# Patient Record
Sex: Male | Born: 1965 | Hispanic: No | Marital: Single | Smoking: Current some day smoker
Health system: Southern US, Community
[De-identification: ages and names within clinical notes are randomized; demographics above are authoritative.]

## PROBLEM LIST (undated history)

## (undated) DIAGNOSIS — R0602 Shortness of breath: Secondary | ICD-10-CM

## (undated) DIAGNOSIS — G4730 Sleep apnea, unspecified: Secondary | ICD-10-CM

## (undated) DIAGNOSIS — R51 Headache: Secondary | ICD-10-CM

## (undated) DIAGNOSIS — F419 Anxiety disorder, unspecified: Secondary | ICD-10-CM

## (undated) DIAGNOSIS — J189 Pneumonia, unspecified organism: Secondary | ICD-10-CM

## (undated) DIAGNOSIS — R079 Chest pain, unspecified: Secondary | ICD-10-CM

## (undated) DIAGNOSIS — Z720 Tobacco use: Secondary | ICD-10-CM

## (undated) DIAGNOSIS — I517 Cardiomegaly: Secondary | ICD-10-CM

## (undated) DIAGNOSIS — F1010 Alcohol abuse, uncomplicated: Secondary | ICD-10-CM

## (undated) DIAGNOSIS — K219 Gastro-esophageal reflux disease without esophagitis: Secondary | ICD-10-CM

---

## 1999-08-06 ENCOUNTER — Emergency Department (HOSPITAL_COMMUNITY): Admission: EM | Admit: 1999-08-06 | Discharge: 1999-08-06 | Payer: Self-pay | Admitting: Emergency Medicine

## 2005-02-04 HISTORY — PX: LAPAROSCOPIC CHOLECYSTECTOMY: SUR755

## 2007-02-15 ENCOUNTER — Emergency Department (HOSPITAL_COMMUNITY): Admission: EM | Admit: 2007-02-15 | Discharge: 2007-02-16 | Payer: Self-pay | Admitting: Emergency Medicine

## 2010-03-27 ENCOUNTER — Emergency Department (HOSPITAL_COMMUNITY)
Admission: EM | Admit: 2010-03-27 | Discharge: 2010-03-28 | Disposition: A | Discharge status: Home / Self Care | Payer: Self-pay | Attending: Emergency Medicine | Admitting: Emergency Medicine

## 2010-03-27 DIAGNOSIS — IMO0002 Reserved for concepts with insufficient information to code with codable children: Secondary | ICD-10-CM | POA: Insufficient documentation

## 2010-03-27 DIAGNOSIS — R0682 Tachypnea, not elsewhere classified: Secondary | ICD-10-CM | POA: Insufficient documentation

## 2010-03-27 DIAGNOSIS — S1093XA Contusion of unspecified part of neck, initial encounter: Secondary | ICD-10-CM | POA: Insufficient documentation

## 2010-03-27 DIAGNOSIS — S0003XA Contusion of scalp, initial encounter: Secondary | ICD-10-CM | POA: Insufficient documentation

## 2010-03-27 DIAGNOSIS — R51 Headache: Secondary | ICD-10-CM | POA: Insufficient documentation

## 2010-03-27 DIAGNOSIS — R000 Tachycardia, unspecified: Secondary | ICD-10-CM | POA: Insufficient documentation

## 2010-03-27 DIAGNOSIS — S0180XA Unspecified open wound of other part of head, initial encounter: Secondary | ICD-10-CM | POA: Insufficient documentation

## 2010-03-27 DIAGNOSIS — Y9289 Other specified places as the place of occurrence of the external cause: Secondary | ICD-10-CM | POA: Insufficient documentation

## 2010-03-27 LAB — DIFFERENTIAL
Basophils Relative: 0 % (ref 0–1)
Monocytes Absolute: 0.5 10*3/uL (ref 0.1–1.0)
Monocytes Relative: 6 % (ref 3–12)
Neutro Abs: 3.5 10*3/uL (ref 1.7–7.7)

## 2010-03-27 LAB — CBC
Hemoglobin: 16.5 g/dL (ref 13.0–17.0)
MCH: 31.4 pg (ref 26.0–34.0)
MCHC: 34.6 g/dL (ref 30.0–36.0)
MCV: 90.7 fL (ref 78.0–100.0)

## 2010-03-28 ENCOUNTER — Emergency Department (HOSPITAL_COMMUNITY): Payer: Self-pay

## 2010-03-28 ENCOUNTER — Encounter (HOSPITAL_COMMUNITY): Payer: Self-pay

## 2010-03-28 LAB — COMPREHENSIVE METABOLIC PANEL
BUN: 8 mg/dL (ref 6–23)
CO2: 22 mEq/L (ref 19–32)
Calcium: 8.6 mg/dL (ref 8.4–10.5)
Creatinine, Ser: 0.76 mg/dL (ref 0.4–1.5)
GFR calc Af Amer: >60 mL/min (ref >60)
GFR calc non Af Amer: >60 mL/min (ref >60)
Glucose, Bld: 211 mg/dL — ABNORMAL HIGH (ref 70–99)

## 2010-03-28 LAB — URINALYSIS, ROUTINE W REFLEX MICROSCOPIC
Protein, ur: NEGATIVE mg/dL
Urobilinogen, UA: 0.2 mg/dL (ref 0.0–1.0)

## 2010-03-28 LAB — LIPASE, BLOOD: Lipase: 31 U/L (ref 11–59)

## 2010-03-28 LAB — ETHANOL: Alcohol, Ethyl (B): 220 mg/dL — ABNORMAL HIGH (ref 0–10)

## 2010-08-20 ENCOUNTER — Emergency Department (HOSPITAL_COMMUNITY)
Admission: EM | Admit: 2010-08-20 | Discharge: 2010-08-20 | Discharge status: Against Medical Advice | Payer: Self-pay | Attending: Emergency Medicine | Admitting: Emergency Medicine

## 2010-08-20 DIAGNOSIS — W01119A Fall on same level from slipping, tripping and stumbling with subsequent striking against unspecified sharp object, initial encounter: Secondary | ICD-10-CM | POA: Insufficient documentation

## 2010-08-20 DIAGNOSIS — S61209A Unspecified open wound of unspecified finger without damage to nail, initial encounter: Secondary | ICD-10-CM | POA: Insufficient documentation

## 2010-08-20 DIAGNOSIS — W268XXA Contact with other sharp object(s), not elsewhere classified, initial encounter: Secondary | ICD-10-CM | POA: Insufficient documentation

## 2010-10-25 LAB — BASIC METABOLIC PANEL
BUN: 9
Calcium: 9
Chloride: 107
Creatinine, Ser: 0.88

## 2010-10-25 LAB — URINALYSIS, ROUTINE W REFLEX MICROSCOPIC
Ketones, ur: NEGATIVE
Nitrite: NEGATIVE
Protein, ur: NEGATIVE
Urobilinogen, UA: 0.2
pH: 5.5

## 2010-10-25 LAB — POCT CARDIAC MARKERS
CKMB, poc: 1.2
Myoglobin, poc: 63.9

## 2010-10-25 LAB — HEPATIC FUNCTION PANEL
ALT: 36
Bilirubin, Direct: 0.2
Indirect Bilirubin: 1 — ABNORMAL HIGH

## 2010-10-25 LAB — CBC
MCHC: 35.7
RBC: 5.13
RDW: 12.7

## 2010-10-25 LAB — DIFFERENTIAL
Basophils Absolute: 0
Basophils Relative: 0
Neutro Abs: 7.4
Neutrophils Relative %: 59

## 2010-12-05 ENCOUNTER — Emergency Department (HOSPITAL_COMMUNITY): Payer: Self-pay

## 2010-12-05 ENCOUNTER — Emergency Department (HOSPITAL_COMMUNITY)
Admission: EM | Admit: 2010-12-05 | Discharge: 2010-12-05 | Disposition: A | Discharge status: Home / Self Care | Payer: Self-pay | Attending: Emergency Medicine | Admitting: Emergency Medicine

## 2010-12-05 DIAGNOSIS — M79609 Pain in unspecified limb: Secondary | ICD-10-CM | POA: Insufficient documentation

## 2010-12-05 DIAGNOSIS — M25539 Pain in unspecified wrist: Secondary | ICD-10-CM | POA: Insufficient documentation

## 2010-12-05 DIAGNOSIS — M7989 Other specified soft tissue disorders: Secondary | ICD-10-CM | POA: Insufficient documentation

## 2010-12-05 DIAGNOSIS — M779 Enthesopathy, unspecified: Secondary | ICD-10-CM | POA: Insufficient documentation

## 2011-01-12 ENCOUNTER — Emergency Department (HOSPITAL_COMMUNITY): Payer: Self-pay

## 2011-01-12 ENCOUNTER — Emergency Department (HOSPITAL_COMMUNITY)
Admission: EM | Admit: 2011-01-12 | Discharge: 2011-01-13 | Disposition: A | Discharge status: Home / Self Care | Payer: Self-pay | Attending: Emergency Medicine | Admitting: Emergency Medicine

## 2011-01-12 ENCOUNTER — Encounter (HOSPITAL_COMMUNITY): Payer: Self-pay | Admitting: Emergency Medicine

## 2011-01-12 DIAGNOSIS — F10929 Alcohol use, unspecified with intoxication, unspecified: Secondary | ICD-10-CM

## 2011-01-12 DIAGNOSIS — M545 Low back pain, unspecified: Secondary | ICD-10-CM | POA: Insufficient documentation

## 2011-01-12 DIAGNOSIS — F17200 Nicotine dependence, unspecified, uncomplicated: Secondary | ICD-10-CM | POA: Insufficient documentation

## 2011-01-12 DIAGNOSIS — M549 Dorsalgia, unspecified: Secondary | ICD-10-CM | POA: Insufficient documentation

## 2011-01-12 DIAGNOSIS — F1010 Alcohol abuse, uncomplicated: Secondary | ICD-10-CM | POA: Insufficient documentation

## 2011-01-12 DIAGNOSIS — R51 Headache: Secondary | ICD-10-CM | POA: Insufficient documentation

## 2011-01-12 DIAGNOSIS — R079 Chest pain, unspecified: Secondary | ICD-10-CM | POA: Insufficient documentation

## 2011-01-12 DIAGNOSIS — R220 Localized swelling, mass and lump, head: Secondary | ICD-10-CM | POA: Insufficient documentation

## 2011-01-12 HISTORY — DX: Alcohol abuse, uncomplicated: F10.10
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
## 2011-01-12 NOTE — ED Notes (Signed)
More awake and speech clear now---Soft drink, sandwich and chips given to pt.
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
## 2011-01-12 NOTE — ED Notes (Signed)
Pt recovering from ETOH intoxication, pt resting.  Plan of care voiced to pt.

## 2011-01-12 NOTE — ED Notes (Signed)
Resting on carrier--awakens with loud verbal stimuli----Pupils equal and reactive--speech is still somewhat slurred.

## 2011-01-12 NOTE — ED Notes (Signed)
Pt. Ate 100% of sandwich and chips---gait is steady, speech is clear and concise--ambulated to restroom

## 2011-01-12 NOTE — ED Notes (Signed)
Back to trt room---gait steady, speech clear.

## 2011-01-12 NOTE — ED Notes (Addendum)
Pt phoning his ride home, Dr. Clarene Duke made aware pt has ride home.  Gave pt sandwich and water.

## 2011-01-13 MED ORDER — ACETAMINOPHEN 325 MG PO TABS
650.0000 mg | ORAL_TABLET | Freq: Once | ORAL | Status: AC
  Administered 2011-01-13: 650 mg via ORAL
  Filled 2011-01-13: qty 2

## 2011-01-13 NOTE — ED Notes (Signed)
Patient is resting comfortably. 

## 2011-01-13 NOTE — ED Notes (Signed)
Patient denies pain and is resting comfortably.  

## 2011-02-15 ENCOUNTER — Emergency Department (HOSPITAL_COMMUNITY)
Admission: EM | Admit: 2011-02-15 | Discharge: 2011-02-16 | Disposition: A | Discharge status: Home / Self Care | Payer: Self-pay | Attending: Emergency Medicine | Admitting: Emergency Medicine

## 2011-02-15 ENCOUNTER — Encounter (HOSPITAL_COMMUNITY): Payer: Self-pay | Admitting: Emergency Medicine

## 2011-02-15 DIAGNOSIS — F17200 Nicotine dependence, unspecified, uncomplicated: Secondary | ICD-10-CM | POA: Insufficient documentation

## 2011-02-15 DIAGNOSIS — J329 Chronic sinusitis, unspecified: Secondary | ICD-10-CM | POA: Insufficient documentation

## 2011-02-15 DIAGNOSIS — R51 Headache: Secondary | ICD-10-CM | POA: Insufficient documentation

## 2011-02-15 DIAGNOSIS — F1010 Alcohol abuse, uncomplicated: Secondary | ICD-10-CM | POA: Insufficient documentation
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
## 2011-02-16 ENCOUNTER — Emergency Department (HOSPITAL_COMMUNITY): Payer: Self-pay

## 2011-02-16 MED ORDER — IBUPROFEN 200 MG PO TABS
600.0000 mg | ORAL_TABLET | Freq: Once | ORAL | Status: AC
  Administered 2011-02-16: 600 mg via ORAL
  Filled 2011-02-16: qty 3

## 2011-02-16 MED ORDER — AMOXICILLIN-POT CLAVULANATE 875-125 MG PO TABS: 1.0000 | ORAL_TABLET | Freq: Two times a day (BID) | ORAL | Status: DC
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
## 2011-02-22 ENCOUNTER — Emergency Department (HOSPITAL_COMMUNITY)
Admission: EM | Admit: 2011-02-22 | Discharge: 2011-02-23 | Disposition: A | Discharge status: Home / Self Care | Payer: Self-pay | Attending: Emergency Medicine | Admitting: Emergency Medicine

## 2011-02-22 ENCOUNTER — Encounter (HOSPITAL_COMMUNITY): Payer: Self-pay | Admitting: *Deleted

## 2011-02-22 ENCOUNTER — Emergency Department (HOSPITAL_COMMUNITY): Payer: Self-pay

## 2011-02-22 ENCOUNTER — Other Ambulatory Visit: Payer: Self-pay

## 2011-02-22 DIAGNOSIS — R0682 Tachypnea, not elsewhere classified: Secondary | ICD-10-CM | POA: Insufficient documentation

## 2011-02-22 DIAGNOSIS — F10121 Alcohol abuse with intoxication delirium: Secondary | ICD-10-CM

## 2011-02-22 DIAGNOSIS — R51 Headache: Secondary | ICD-10-CM | POA: Insufficient documentation

## 2011-02-22 DIAGNOSIS — F10231 Alcohol dependence with withdrawal delirium: Secondary | ICD-10-CM | POA: Insufficient documentation

## 2011-02-22 DIAGNOSIS — M542 Cervicalgia: Secondary | ICD-10-CM | POA: Insufficient documentation

## 2011-02-22 DIAGNOSIS — F10931 Alcohol use, unspecified with withdrawal delirium: Secondary | ICD-10-CM | POA: Insufficient documentation

## 2011-02-22 LAB — DIFFERENTIAL
Basophils Relative: 0 % (ref 0–1)
Lymphocytes Relative: 50 % — ABNORMAL HIGH (ref 12–46)
Lymphs Abs: 3.8 10*3/uL (ref 0.7–4.0)
Monocytes Absolute: 0.5 10*3/uL (ref 0.1–1.0)
Monocytes Relative: 6 % (ref 3–12)
Neutro Abs: 3.3 10*3/uL (ref 1.7–7.7)

## 2011-02-22 LAB — POCT I-STAT, CHEM 8
Creatinine, Ser: 1.1 mg/dL (ref 0.50–1.35)
HCT: 47 % (ref 39.0–52.0)
Hemoglobin: 16 g/dL (ref 13.0–17.0)
Potassium: 3.6 mEq/L (ref 3.5–5.1)
Sodium: 146 mEq/L — ABNORMAL HIGH (ref 135–145)
TCO2: 22 mmol/L (ref 0–100)

## 2011-02-22 LAB — URINALYSIS, ROUTINE W REFLEX MICROSCOPIC
Bilirubin Urine: NEGATIVE
Hgb urine dipstick: NEGATIVE
Nitrite: NEGATIVE
Protein, ur: NEGATIVE mg/dL
Specific Gravity, Urine: 1.004 — ABNORMAL LOW (ref 1.005–1.030)
Urobilinogen, UA: 0.2 mg/dL (ref 0.0–1.0)

## 2011-02-22 LAB — COMPREHENSIVE METABOLIC PANEL
Alkaline Phosphatase: 111 U/L (ref 39–117)
BUN: 9 mg/dL (ref 6–23)
CO2: 21 mEq/L (ref 19–32)
Chloride: 110 mEq/L (ref 96–112)
Creatinine, Ser: 0.74 mg/dL (ref 0.50–1.35)
GFR calc Af Amer: >90 mL/min (ref >90)
GFR calc non Af Amer: >90 mL/min (ref >90)
Glucose, Bld: 119 mg/dL — ABNORMAL HIGH (ref 70–99)
Potassium: 3.6 mEq/L (ref 3.5–5.1)
Total Bilirubin: 0.2 mg/dL — ABNORMAL LOW (ref 0.3–1.2)

## 2011-02-22 LAB — RAPID URINE DRUG SCREEN, HOSP PERFORMED
Amphetamines: NOT DETECTED
Barbiturates: NOT DETECTED
Tetrahydrocannabinol: NOT DETECTED

## 2011-02-22 LAB — CBC
HCT: 44.8 % (ref 39.0–52.0)
Hemoglobin: 16.1 g/dL (ref 13.0–17.0)
MCHC: 35.9 g/dL (ref 30.0–36.0)
RBC: 5.1 MIL/uL (ref 4.22–5.81)

## 2011-02-22 LAB — CK TOTAL AND CKMB (NOT AT ARMC)
CK, MB: 3.2 ng/mL (ref 0.3–4.0)
Total CK: 249 U/L — ABNORMAL HIGH (ref 7–232)

## 2011-02-22 LAB — APTT: aPTT: 29 seconds (ref 24–37)

## 2011-02-22 MED ORDER — KETOROLAC TROMETHAMINE 30 MG/ML IJ SOLN
30.0000 mg | Freq: Once | INTRAMUSCULAR | Status: AC
  Administered 2011-02-22: 30 mg via INTRAVENOUS
  Filled 2011-02-22: qty 1

## 2011-02-22 MED ORDER — ACETAMINOPHEN 325 MG PO TABS
650.0000 mg | ORAL_TABLET | Freq: Once | ORAL | Status: AC
  Administered 2011-02-22: 650 mg via ORAL
  Filled 2011-02-22: qty 2
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
## 2011-02-22 NOTE — ED Notes (Signed)
Family at bedside. Updated family on patient condition. Family left and they said to call them when patient ready for d/c or if he gets admitted

## 2011-02-23 NOTE — Discharge Instructions (Signed)
 RESOURCE GUIDE  Dental Problems  Patients with Medicaid: [facility]                     [facility] [address]                                           [address] Phone:  [phone]                                                  Phone:  [phone]  If unable to pay or uninsured, contact:  [facility] or [facility] to become qualified for the adult dental clinic.  Chronic Pain Problems Contact [facility]  [phone] Patients need to be referred by their primary care doctor.  Insufficient Money for Medicine Contact [employer]:  call "211" or [facility] 271-5999.  No Primary Care Doctor Call [facility]  832-8000 Other agencies that provide inexpensive medical care    [facility]  [phone]    [facility]  [phone]    [facility]  [phone]    [facility]  832-4777    [facility]  [phone]    [facility]  [phone]  Psychological Services [facility]  [phone] [facility]  [phone] [facility]   800 853-5163 (emergency services 641-4993)  Substance Abuse Resources [facility]  336-882-2125 [facility] 336-784-9470 [facility] 336-285-9073 [facility] 336-845-3988 Residential & Outpatient Substance Abuse Program  800-659-3381  Abuse/Neglect [place] Child Abuse Hotline (336) 641-3795 [place] Child Abuse Hotline 800-378-5315 (After Hours)  Emergency Shelter [facility] (336) 271-5985  [facility] (336) 275-9566 [facility] (704) 372-4663  MRSA Hotline #:   832-7006    [place] [facility]  [facility] [place]     [employer]                          [facility] [address] [place]                       [address]      [address]  [place]                                                [place]                            [place] Phone:  [phone]                                   Phone:  [phone]                 Phone:  [phone]  [facility] Phone:    [phone]  [place] Child Abuse Hotline (336) 342-1394 (336) 342-3537 (After Hours)   

## 2011-02-23 NOTE — ED Notes (Signed)
Pt states that he should be able to call someone in about 20 mins to come and get him. Will try at that time.

## 2011-02-23 NOTE — ED Provider Notes (Signed)
  3:19 AM  Pt is awake, alert, reports he is hungry.  He denies SI, HI, no hallucinations.  He is not interested in alcohol detox.  Will allow him to eat and drink.  Will be able to discharge in the AM when he can get a ride which he states he cannot get home at this present hour.     Lashena Signer Y.     Gavin Pound. Oletta Lamas, MD 02/23/11 1610

## 2011-02-23 NOTE — ED Notes (Signed)
Report is received. Pt has been up for discharge for some time. Pt is intoxicated and does not have a ride home. Pt will be discharged after his ride arrives.

## 2011-02-23 NOTE — ED Notes (Signed)
Code stroke cancelled per Dr. Lavera Guise (late entry) per RRT RN.

## 2011-02-25 ENCOUNTER — Emergency Department (HOSPITAL_COMMUNITY)
Admission: EM | Admit: 2011-02-25 | Discharge: 2011-02-26 | Disposition: A | Discharge status: Home / Self Care | Payer: Self-pay | Attending: Emergency Medicine | Admitting: Emergency Medicine

## 2011-02-25 ENCOUNTER — Emergency Department (HOSPITAL_COMMUNITY): Payer: Self-pay

## 2011-02-25 ENCOUNTER — Encounter (HOSPITAL_COMMUNITY): Payer: Self-pay | Admitting: Emergency Medicine

## 2011-02-25 DIAGNOSIS — J45909 Unspecified asthma, uncomplicated: Secondary | ICD-10-CM | POA: Insufficient documentation

## 2011-02-25 DIAGNOSIS — F17200 Nicotine dependence, unspecified, uncomplicated: Secondary | ICD-10-CM | POA: Insufficient documentation

## 2011-02-25 DIAGNOSIS — Z79899 Other long term (current) drug therapy: Secondary | ICD-10-CM | POA: Insufficient documentation

## 2011-02-25 DIAGNOSIS — IMO0001 Reserved for inherently not codable concepts without codable children: Secondary | ICD-10-CM | POA: Insufficient documentation

## 2011-02-25 DIAGNOSIS — F1010 Alcohol abuse, uncomplicated: Secondary | ICD-10-CM | POA: Insufficient documentation

## 2011-02-25 DIAGNOSIS — F10929 Alcohol use, unspecified with intoxication, unspecified: Secondary | ICD-10-CM

## 2011-02-25 DIAGNOSIS — R51 Headache: Secondary | ICD-10-CM | POA: Insufficient documentation

## 2011-02-25 LAB — URINALYSIS, ROUTINE W REFLEX MICROSCOPIC
Hgb urine dipstick: NEGATIVE
Protein, ur: NEGATIVE mg/dL
Urobilinogen, UA: 0.2 mg/dL (ref 0.0–1.0)

## 2011-02-25 LAB — RAPID URINE DRUG SCREEN, HOSP PERFORMED
Amphetamines: NOT DETECTED
Opiates: NOT DETECTED
Tetrahydrocannabinol: NOT DETECTED

## 2011-02-25 LAB — BASIC METABOLIC PANEL
BUN: 10 mg/dL (ref 6–23)
Calcium: 9.1 mg/dL (ref 8.4–10.5)
GFR calc non Af Amer: >90 mL/min (ref >90)
Glucose, Bld: 107 mg/dL — ABNORMAL HIGH (ref 70–99)
Sodium: 145 mEq/L (ref 135–145)
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
## 2011-02-25 NOTE — ED Notes (Signed)
Pt undresses, and put in hospital gown.
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
## 2011-02-25 NOTE — ED Notes (Signed)
Bed rails up, call at bedside.

## 2011-02-26 ENCOUNTER — Other Ambulatory Visit: Payer: Self-pay

## 2011-02-26 NOTE — ED Notes (Signed)
Patient refused to ambulate in hall.

## 2011-02-26 NOTE — ED Notes (Signed)
Pt refused to ambulate.  He stated that he did not feel well enough to walk

## 2011-02-26 NOTE — ED Provider Notes (Signed)
Medical screening examination/treatment/procedure(s) were performed by non-physician practitioner and as supervising physician I was immediately available for consultation/collaboration.  Sumeet Geter L Victorhugo Preis, MD 02/26/11 1325 

## 2011-02-26 NOTE — ED Notes (Signed)
Patient was able to urinate. Voided 700 cc into urinal.

## 2011-02-26 NOTE — ED Notes (Signed)
Pt currently denies abd pain but now reports generalized body aches and feeling feverish ambulated ghalf the length of POD A unit gait steady with assist of one staff pt reports increasing fatique.

## 2011-02-26 NOTE — ED Notes (Signed)
Patient attempted to urinate in urinal for 2nd time and was unable. States he feels the urge but is not able to.

## 2011-02-26 NOTE — ED Provider Notes (Signed)
  Physical Exam  BP 128/74  Pulse 105  Temp(Src) 98.1 F (36.7 C) (Oral)  Resp 21  SpO2 97%  Physical Exam  ED Course  Procedures  MDM Patient now awake, sober, alert and has ambulated. It is benign hours since last alcohol level checked, he is clinically sober and I do not see a need to repeat the lab work to improve soreness. He is answering my questions without any difficulty, has tolerated fluids and food by mouth and it is stable for discharge at this time.      Vida Roller, MD 02/26/11 [phone]

## 2011-04-15 ENCOUNTER — Emergency Department (HOSPITAL_BASED_OUTPATIENT_CLINIC_OR_DEPARTMENT_OTHER)
Admission: EM | Admit: 2011-04-15 | Discharge: 2011-04-15 | Disposition: A | Discharge status: Home / Self Care | Payer: Self-pay | Attending: Emergency Medicine | Admitting: Emergency Medicine

## 2011-04-15 ENCOUNTER — Emergency Department (INDEPENDENT_AMBULATORY_CARE_PROVIDER_SITE_OTHER): Payer: Self-pay

## 2011-04-15 ENCOUNTER — Encounter (HOSPITAL_BASED_OUTPATIENT_CLINIC_OR_DEPARTMENT_OTHER): Payer: Self-pay | Admitting: *Deleted

## 2011-04-15 DIAGNOSIS — R51 Headache: Secondary | ICD-10-CM | POA: Insufficient documentation

## 2011-04-15 DIAGNOSIS — F411 Generalized anxiety disorder: Secondary | ICD-10-CM | POA: Insufficient documentation

## 2011-04-15 DIAGNOSIS — M542 Cervicalgia: Secondary | ICD-10-CM

## 2011-04-15 DIAGNOSIS — T07XXXA Unspecified multiple injuries, initial encounter: Secondary | ICD-10-CM

## 2011-04-15 DIAGNOSIS — J45909 Unspecified asthma, uncomplicated: Secondary | ICD-10-CM | POA: Insufficient documentation

## 2011-04-15 DIAGNOSIS — IMO0002 Reserved for concepts with insufficient information to code with codable children: Secondary | ICD-10-CM | POA: Insufficient documentation

## 2011-04-15 DIAGNOSIS — S0993XA Unspecified injury of face, initial encounter: Secondary | ICD-10-CM | POA: Insufficient documentation

## 2011-04-15 DIAGNOSIS — S199XXA Unspecified injury of neck, initial encounter: Secondary | ICD-10-CM | POA: Insufficient documentation

## 2011-04-15 DIAGNOSIS — X58XXXA Exposure to other specified factors, initial encounter: Secondary | ICD-10-CM

## 2011-04-15 DIAGNOSIS — R232 Flushing: Secondary | ICD-10-CM | POA: Insufficient documentation

## 2011-04-15 MED ORDER — BACITRACIN 500 UNIT/GM EX OINT
1.0000 "application " | TOPICAL_OINTMENT | Freq: Two times a day (BID) | CUTANEOUS | Status: DC
  Administered 2011-04-15: 1 via TOPICAL
  Filled 2011-04-15: qty 0.9
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
## 2011-04-15 NOTE — Discharge Instructions (Signed)
Abrasions Abrasions are skin scrapes. Their treatment depends on how large and deep the abrasion is. Abrasions do not extend through all layers of the skin. A cut or lesion through all skin layers is called a laceration. HOME CARE INSTRUCTIONS   If you were given a dressing, change it at least once a day or as instructed by your caregiver. If the bandage sticks, soak it off with a solution of water or hydrogen peroxide.   Twice a day, wash the area with soap and water to remove all the cream/ointment. You may do this in a sink, under a tub faucet, or in a shower. Rinse off the soap and pat dry with a clean towel. Look for signs of infection (see below).   Reapply cream/ointment according to your caregiver's instruction. This will help prevent infection and keep the bandage from sticking. Telfa or gauze over the wound and under the dressing or wrap will also help keep the bandage from sticking.   If the bandage becomes wet, dirty, or develops a foul smell, change it as soon as possible.   Only take over-the-counter or prescription medicines for pain, discomfort, or fever as directed by your caregiver.  SEEK IMMEDIATE MEDICAL CARE IF:   Increasing pain in the wound.   Signs of infection develop: redness, swelling, surrounding area is tender to touch, or pus coming from the wound.   You have a fever.   Any foul smell coming from the wound or dressing.  Most skin wounds heal within ten days. Facial wounds heal faster. However, an infection may occur despite proper treatment. You should have the wound checked for signs of infection within 24 to 48 hours or sooner if problems arise. If you were not given a wound-check appointment, look closely at the wound yourself on the second day for early signs of infection listed above. MAKE SURE YOU:   Understand these instructions.   Will watch your condition.   Will get help right away if you are not doing well or get worse.  Document Released:  10/31/2004 Document Revised: 01/10/2011 Document Reviewed: 12/25/2010 Exit[employer] Patient Information 2012 [employer]. 
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
## 2011-06-03 ENCOUNTER — Emergency Department (HOSPITAL_COMMUNITY): Payer: Self-pay

## 2011-06-03 ENCOUNTER — Encounter (HOSPITAL_COMMUNITY): Payer: Self-pay | Admitting: *Deleted

## 2011-06-03 ENCOUNTER — Emergency Department (HOSPITAL_COMMUNITY)
Admission: EM | Admit: 2011-06-03 | Discharge: 2011-06-04 | Disposition: A | Discharge status: Home / Self Care | Payer: Self-pay | Attending: Emergency Medicine | Admitting: Emergency Medicine

## 2011-06-03 DIAGNOSIS — J45909 Unspecified asthma, uncomplicated: Secondary | ICD-10-CM | POA: Insufficient documentation

## 2011-06-03 DIAGNOSIS — R51 Headache: Secondary | ICD-10-CM | POA: Insufficient documentation

## 2011-06-03 DIAGNOSIS — S0990XA Unspecified injury of head, initial encounter: Secondary | ICD-10-CM | POA: Insufficient documentation
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
## 2011-06-03 NOTE — ED Notes (Signed)
Spine board removed.  The pt remains alert and oriented

## 2011-06-03 NOTE — ED Notes (Signed)
The pt remains alert taken to c-t

## 2011-06-04 ENCOUNTER — Encounter (HOSPITAL_COMMUNITY): Payer: Self-pay | Admitting: Radiology

## 2011-06-04 ENCOUNTER — Emergency Department (HOSPITAL_COMMUNITY): Payer: Self-pay

## 2011-06-04 MED ORDER — TRAMADOL HCL 50 MG PO TABS: 50.0000 mg | ORAL_TABLET | Freq: Four times a day (QID) | ORAL | Status: AC | PRN

## 2011-06-04 MED ORDER — ACETAMINOPHEN 325 MG PO TABS
650.0000 mg | ORAL_TABLET | Freq: Once | ORAL | Status: AC
  Administered 2011-06-04: 650 mg via ORAL
  Filled 2011-06-04: qty 2

## 2011-06-04 NOTE — ED Notes (Signed)
Pt was able to ambulate with not problem

## 2011-06-04 NOTE — ED Notes (Signed)
Pt waiting for a disposition.   

## 2011-06-04 NOTE — ED Notes (Signed)
The pt returned from c-t waiting for a disposition

## 2011-06-04 NOTE — Discharge Instructions (Signed)
Blunt Trauma °You have been evaluated for injuries. You have been examined and your caregiver has not found injuries serious enough to require hospitalization. °It is common to have multiple bruises and sore muscles following an accident. These tend to feel worse for the first 24 hours. You will feel more stiffness and soreness over the next several hours and worse when you wake up the first morning after your accident. After this point, you should begin to improve with each passing day. The amount of improvement depends on the amount of damage done in the accident. °Following your accident, if some part of your body does not work as it should, or if the pain in any area continues to increase, you should return to the Emergency Department for re-evaluation.  °HOME CARE INSTRUCTIONS  °Routine care for sore areas should include: °· Ice to sore areas every 2 hours for 20 minutes while awake for the next 2 days.  °· Drink extra fluids (not alcohol).  °· Take a hot or warm shower or bath once or twice a day to increase blood flow to sore muscles. This will help you "limber up".  °· Activity as tolerated. Lifting may aggravate neck or back pain.  °· Only take over-the-counter or prescription medicines for pain, discomfort, or fever as directed by your caregiver. Do not use aspirin. This may increase bruising or increase bleeding if there are small areas where this is happening.  °SEEK IMMEDIATE MEDICAL CARE IF: °· Numbness, tingling, weakness, or problem with the use of your arms or legs.  °· A severe headache is not relieved with medications.  °· There is a change in bowel or bladder control.  °· Increasing pain in any areas of the body.  °· Short of breath or dizzy.  °· Nauseated, vomiting, or sweating.  °· Increasing belly (abdominal) discomfort.  °· Blood in urine, stool, or vomiting blood.  °· Pain in either shoulder in an area where a shoulder strap would be.  °· Feelings of lightheadedness or if you have a fainting  episode.  °Sometimes it is not possible to identify all injuries immediately after the trauma. It is important that you continue to monitor your condition after the emergency department visit. If you feel you are not improving, or improving more slowly than should be expected, call your physician. If you feel your symptoms (problems) are worsening, return to the Emergency Department immediately. °Document Released: 10/17/2000 Document Revised: 01/10/2011 Document Reviewed: 09/09/2007 °[employer]® Patient Information ©2012 [employer]. °

## 2011-06-24 ENCOUNTER — Emergency Department (HOSPITAL_COMMUNITY): Payer: Self-pay

## 2011-06-24 ENCOUNTER — Emergency Department (HOSPITAL_COMMUNITY)
Admission: EM | Admit: 2011-06-24 | Discharge: 2011-06-24 | Disposition: A | Discharge status: Home / Self Care | Payer: Self-pay | Attending: Emergency Medicine | Admitting: Emergency Medicine

## 2011-06-24 ENCOUNTER — Encounter (HOSPITAL_COMMUNITY): Payer: Self-pay | Admitting: Emergency Medicine

## 2011-06-24 DIAGNOSIS — R509 Fever, unspecified: Secondary | ICD-10-CM | POA: Insufficient documentation

## 2011-06-24 DIAGNOSIS — R42 Dizziness and giddiness: Secondary | ICD-10-CM | POA: Insufficient documentation

## 2011-06-24 DIAGNOSIS — M549 Dorsalgia, unspecified: Secondary | ICD-10-CM | POA: Insufficient documentation

## 2011-06-24 DIAGNOSIS — R110 Nausea: Secondary | ICD-10-CM | POA: Insufficient documentation

## 2011-06-24 DIAGNOSIS — R209 Unspecified disturbances of skin sensation: Secondary | ICD-10-CM | POA: Insufficient documentation

## 2011-06-24 DIAGNOSIS — J45909 Unspecified asthma, uncomplicated: Secondary | ICD-10-CM | POA: Insufficient documentation

## 2011-06-24 DIAGNOSIS — G43909 Migraine, unspecified, not intractable, without status migrainosus: Secondary | ICD-10-CM | POA: Insufficient documentation

## 2011-06-24 DIAGNOSIS — R61 Generalized hyperhidrosis: Secondary | ICD-10-CM | POA: Insufficient documentation

## 2011-06-24 DIAGNOSIS — F17200 Nicotine dependence, unspecified, uncomplicated: Secondary | ICD-10-CM | POA: Insufficient documentation

## 2011-06-24 LAB — CBC
HCT: 43.3 % (ref 39.0–52.0)
Hemoglobin: 15.4 g/dL (ref 13.0–17.0)
RBC: 4.73 MIL/uL (ref 4.22–5.81)
WBC: 5.4 10*3/uL (ref 4.0–10.5)

## 2011-06-24 LAB — BASIC METABOLIC PANEL
BUN: 11 mg/dL (ref 6–23)
CO2: 20 mEq/L (ref 19–32)
Chloride: 108 mEq/L (ref 96–112)
GFR calc non Af Amer: >90 mL/min (ref >90)
Glucose, Bld: 113 mg/dL — ABNORMAL HIGH (ref 70–99)
Potassium: 3.8 mEq/L (ref 3.5–5.1)
Sodium: 143 mEq/L (ref 135–145)

## 2011-06-24 MED ORDER — DEXAMETHASONE SODIUM PHOSPHATE 10 MG/ML IJ SOLN
10.0000 mg | Freq: Once | INTRAMUSCULAR | Status: AC
  Administered 2011-06-24: 10 mg via INTRAVENOUS
  Filled 2011-06-24: qty 1

## 2011-06-24 MED ORDER — DEXAMETHASONE 10 MG/ML FOR PEDIATRIC ORAL USE
INTRAMUSCULAR | Status: AC
  Filled 2011-06-24: qty 1

## 2011-06-24 MED ORDER — SODIUM CHLORIDE 0.9 % IV BOLUS (SEPSIS): 1000.0000 mL | Freq: Once | INTRAVENOUS | Status: DC

## 2011-06-24 MED ORDER — METOCLOPRAMIDE HCL 5 MG/ML IJ SOLN
10.0000 mg | Freq: Once | INTRAMUSCULAR | Status: AC
  Administered 2011-06-24: 10 mg via INTRAVENOUS
  Filled 2011-06-24: qty 2

## 2011-06-24 MED ORDER — DIPHENHYDRAMINE HCL 50 MG/ML IJ SOLN
25.0000 mg | Freq: Once | INTRAMUSCULAR | Status: AC
  Administered 2011-06-24: 25 mg via INTRAVENOUS
  Filled 2011-06-24: qty 1

## 2011-06-24 NOTE — Progress Notes (Signed)
ED CM reviewed self pay pcps, importance of pcp f/u care & cost of pcp services, needymeds.com, DSS, health dept, crisis programs, dental information.  Pt provided with written information for resources reviewed Pt voiced understanding and appreciation of services

## 2011-06-24 NOTE — ED Notes (Signed)
States woke up with numbness on left side of face, hair feels like it is going to fall out, pain runs down spine and to chest, left ear pain, multiple c/o's --

## 2011-06-24 NOTE — ED Provider Notes (Signed)
History     CSN: [id number]  Arrival date & time 06/24/11  7829   First MD Initiated Contact with Patient 06/24/11 [phone]      Chief Complaint  Patient presents with  . Migraine  . Fever  . Dizziness    (Consider location/radiation/quality/duration/timing/severity/associated sxs/prior treatment) Patient is a 46 y.o. male presenting with migraine and fever. The history is provided by the patient. No language interpreter was used.  Migraine This is a new problem. The current episode started today. The problem occurs constantly. The problem has been gradually improving. Associated symptoms include diaphoresis, headaches, nausea and numbness. Pertinent negatives include no abdominal pain, chest pain, fever, neck pain, rash, sore throat, urinary symptoms, vomiting or weakness. Treatments tried: beer. The treatment provided no relief.  Fever Primary symptoms of the febrile illness include headaches and nausea. Primary symptoms do not include fever, shortness of breath, abdominal pain, vomiting or rash.  The headache is associated with photophobia. The headache is not associated with neck stiffness or weakness.  States that he woke with a "migraine" headache around 7am to the L face with numbness and pain 10/10.  States that he sat with his brother and drank a "40" (beer) after that because he was afraid.   States that the pain went into his back and chest with palpatations and has resolved presently.  States that the pain in his head now is 6/10 presently and intermittant.  States that he was crying earlier.  Also states that he saw some blue dots.    Past Medical History  Diagnosis Date  . ETOH abuse   . Asthma     History reviewed. No pertinent past surgical history.  History reviewed. No pertinent family history.  History  Substance Use Topics  . Smoking status: Current Everyday Smoker  . Smokeless tobacco: Not on file  . Alcohol Use: Yes     drank 6 pack last night and 1 beer this  am --20 oz      Review of Systems  Constitutional: Positive for diaphoresis. Negative for fever.  HENT: Negative for sore throat, neck pain, neck stiffness, dental problem and sinus pressure.   Eyes: Positive for photophobia. Negative for discharge and visual disturbance.  Respiratory: Negative for shortness of breath.   Cardiovascular: Negative for chest pain.  Gastrointestinal: Positive for nausea. Negative for vomiting and abdominal pain.  Musculoskeletal: Negative for back pain.  Skin: Negative.  Negative for rash.  Neurological: Positive for numbness and headaches. Negative for dizziness, facial asymmetry, speech difficulty, weakness and light-headedness.  Psychiatric/Behavioral: The patient is nervous/anxious.     Allergies  Review of patient's allergies indicates no known allergies.  Home Medications   Current Outpatient Rx  Name Route Sig Dispense Refill  . ALBUTEROL SULFATE HFA 108 (90 BASE) MCG/ACT IN AERS Inhalation Inhale 2 puffs into the lungs every 6 (six) hours as needed. For shortness of breath.    . IBUPROFEN 200 MG PO TABS Oral Take 200 mg by mouth every 6 (six) hours as needed. pain      BP 126/74  Pulse 111  Temp(Src) 98.9 F (37.2 C) (Oral)  Resp 16  SpO2 96%  Physical Exam  Nursing note and vitals reviewed. Constitutional: He is oriented to person, place, and time. He appears well-developed and well-nourished.  HENT:  Head: Normocephalic.  Eyes: Conjunctivae and EOM are normal. Pupils are equal, round, and reactive to light.  Neck: Normal range of motion. Neck supple.  Cardiovascular: Normal rate.  Pulmonary/Chest: Effort normal.  Abdominal: Soft.  Musculoskeletal: Normal range of motion. He exhibits tenderness.       L face  Neurological: He is alert and oriented to person, place, and time. He has normal strength and normal reflexes. No cranial nerve deficit or sensory deficit. He displays a negative Romberg sign. Coordination normal. GCS eye  subscore is 4. GCS verbal subscore is 5. GCS motor subscore is 6.  Skin: Skin is warm and dry.  Psychiatric: He has a normal mood and affect.    ED Course  Procedures (including critical care time)  Labs Reviewed - No data to display No results found.   No diagnosis found.    MDM  Woke with L migraine h/a with intermittant L facial numbness.  CT head normal.  Migraine cocktail in the ER with relief.  Will follow up with PCP or return if worse.  Neuro in tact.         Remi Haggard, NP 06/24/11 1928

## 2011-06-24 NOTE — Discharge Instructions (Signed)
Mr Lona the headache you had today may have been a migraine h/a.  Get a physician from the list below to follow up with.  Return to the ER for severe pain or weakness.  The CT of the head shows no acute findings like a stroke.  We treated you in the ER today with a migraine cocktail with benadryl, decadron, and reglan.     Migraine Headache A migraine is very bad pain on one or both sides of your head. The cause of a migraine is not always known. A migraine can be triggered or caused by different things, such as:  Alcohol.   Smoking.   Stress.   Periods (menstruation) in women.   Aged cheeses.   Foods or drinks that contain nitrates, glutamate, aspartame, or tyramine.   Lack of sleep.   Chocolate.   Caffeine.   Hunger.   Medicines, such as nitroglycerine (used to treat chest pain), birth control pills, estrogen, and some blood pressure medicines.  HOME CARE  Many medicines can help migraine pain or keep migraines from coming back. Your doctor can help you decide on a medicine or treatment program.   If you or your child gets a migraine, it may help to lie down in a dark, quiet room.   Keep a headache journal. This may help find out what is causing the headaches. For example, write down:   What you eat and drink.   How much sleep you get.   Any change to your diet or medicines.  GET HELP RIGHT AWAY IF:   The medicine does not work.   The pain begins again.   The neck is stiff.   You have trouble seeing.   The muscles are weak or you lose muscle control.   You have new symptoms.   You lose your balance.   You have trouble walking.   You feel faint or pass out.  MAKE SURE YOU:   Understand these instructions.   Will watch this condition.   Will get help right away if you are not doing well or get worse.  Document Released: 10/31/2007 Document Revised: 01/10/2011 Document Reviewed: 09/26/2008 [facility] Patient Information 2012 [place], [place].

## 2011-06-25 NOTE — ED Provider Notes (Signed)
Medical screening examination/treatment/procedure(s) were performed by non-physician practitioner and as supervising physician I was immediately available for consultation/collaboration.   Lyanne Co, MD 06/25/11 1321

## 2011-10-20 ENCOUNTER — Emergency Department (HOSPITAL_COMMUNITY)
Admission: EM | Admit: 2011-10-20 | Discharge: 2011-10-21 | Disposition: A | Discharge status: Home / Self Care | Payer: Self-pay | Attending: Emergency Medicine | Admitting: Emergency Medicine

## 2011-10-20 ENCOUNTER — Encounter (HOSPITAL_COMMUNITY): Payer: Self-pay | Admitting: Emergency Medicine

## 2011-10-20 DIAGNOSIS — J45909 Unspecified asthma, uncomplicated: Secondary | ICD-10-CM | POA: Insufficient documentation

## 2011-10-20 DIAGNOSIS — K2970 Gastritis, unspecified, without bleeding: Secondary | ICD-10-CM | POA: Insufficient documentation

## 2011-10-20 DIAGNOSIS — F1010 Alcohol abuse, uncomplicated: Secondary | ICD-10-CM | POA: Insufficient documentation

## 2011-10-20 LAB — COMPREHENSIVE METABOLIC PANEL
Alkaline Phosphatase: 123 U/L — ABNORMAL HIGH (ref 39–117)
BUN: 11 mg/dL (ref 6–23)
Creatinine, Ser: 0.68 mg/dL (ref 0.50–1.35)
GFR calc Af Amer: >90 mL/min (ref >90)
Glucose, Bld: 127 mg/dL — ABNORMAL HIGH (ref 70–99)
Potassium: 3.3 mEq/L — ABNORMAL LOW (ref 3.5–5.1)
Total Bilirubin: 0.3 mg/dL (ref 0.3–1.2)
Total Protein: 7.3 g/dL (ref 6.0–8.3)

## 2011-10-20 LAB — CBC WITH DIFFERENTIAL/PLATELET
Eosinophils Absolute: 0.1 10*3/uL (ref 0.0–0.7)
HCT: 42.6 % (ref 39.0–52.0)
Hemoglobin: 15.6 g/dL (ref 13.0–17.0)
Lymphs Abs: 1.9 10*3/uL (ref 0.7–4.0)
MCH: 31.9 pg (ref 26.0–34.0)
MCHC: 36.6 g/dL — ABNORMAL HIGH (ref 30.0–36.0)
MCV: 87.1 fL (ref 78.0–100.0)
Monocytes Absolute: 0.4 10*3/uL (ref 0.1–1.0)
Monocytes Relative: 8 % (ref 3–12)
Neutrophils Relative %: 53 % (ref 43–77)
RBC: 4.89 MIL/uL (ref 4.22–5.81)

## 2011-10-20 NOTE — ED Notes (Signed)
Pt alert, arrives from home, c/o gen abd pain, resp even unlabored, skin pwd, recent Flu like illness

## 2011-10-21 ENCOUNTER — Emergency Department (HOSPITAL_COMMUNITY): Payer: Self-pay

## 2011-10-21 LAB — URINALYSIS, MICROSCOPIC ONLY
Ketones, ur: NEGATIVE mg/dL
Leukocytes, UA: NEGATIVE
Nitrite: NEGATIVE
Protein, ur: NEGATIVE mg/dL

## 2011-10-21 MED ORDER — PANTOPRAZOLE SODIUM 40 MG IV SOLR
40.0000 mg | Freq: Once | INTRAVENOUS | Status: AC
  Administered 2011-10-21: 40 mg via INTRAVENOUS
  Filled 2011-10-21: qty 40

## 2011-10-21 MED ORDER — ONDANSETRON HCL 4 MG/2ML IJ SOLN
4.0000 mg | Freq: Once | INTRAMUSCULAR | Status: AC
  Administered 2011-10-21: 4 mg via INTRAVENOUS
  Filled 2011-10-21: qty 2

## 2011-10-21 MED ORDER — FAMOTIDINE 20 MG PO TABS: 20.0000 mg | ORAL_TABLET | Freq: Two times a day (BID) | ORAL | Status: DC

## 2011-10-21 MED ORDER — GI COCKTAIL ~~LOC~~
30.0000 mL | Freq: Once | ORAL | Status: AC
  Administered 2011-10-21: 30 mL via ORAL
  Filled 2011-10-21: qty 30

## 2011-10-21 MED ORDER — MORPHINE SULFATE 4 MG/ML IJ SOLN
4.0000 mg | Freq: Once | INTRAMUSCULAR | Status: AC
  Administered 2011-10-21: 4 mg via INTRAVENOUS
  Filled 2011-10-21: qty 1

## 2011-10-21 MED ORDER — SODIUM CHLORIDE 0.9 % IV BOLUS (SEPSIS)
1000.0000 mL | Freq: Once | INTRAVENOUS | Status: AC
  Administered 2011-10-21: 1000 mL via INTRAVENOUS

## 2011-10-21 MED ORDER — OMEPRAZOLE 20 MG PO CPDR: 20.0000 mg | DELAYED_RELEASE_CAPSULE | Freq: Every day | ORAL | Status: DC

## 2011-10-21 NOTE — ED Provider Notes (Signed)
Medical screening examination/treatment/procedure(s) were performed by non-physician practitioner and as supervising physician I was immediately available for consultation/collaboration.  Daking Westervelt M Gyanna Jarema, MD 10/21/11 2139 

## 2011-10-21 NOTE — ED Notes (Signed)
Pt c/o mid center chest pain that radiates to back and neck. Pt states pain started when morphine was in. Pt was informed before insertion that some pts get a sensation of being "kicked in the chest" and feel "funny" as described prior.

## 2011-10-21 NOTE — ED Provider Notes (Signed)
History     CSN: [id number]  Arrival date & time 10/20/11  2121   First MD Initiated Contact with Patient 10/21/11 0208      Chief Complaint  Patient presents with  . Abdominal Pain   HPI  History by the patient. Patient is a 46 year old male with history of alcohol abuse and asthma who presents with complaints of diffuse abdominal pains. Patient states he was out for a walk and son had sharp stabbing pains to his epigastric and upper abdominal area. Pain was brief he continued to have some residual cramping and pains diffusely and abdomen. Pain was associated with slight nausea but denies any episodes of vomiting. Patient does admit to drinking alcohol earlier in the day. Patient denies having similar symptoms previously. Denies any history of pancreatitis. He denies any fever, chills or sweats. Patient has no prior history of abdominal surgery. He denies any diarrhea or constipation symptoms.    Past Medical History  Diagnosis Date  . ETOH abuse   . Asthma     History reviewed. No pertinent past surgical history.  No family history on file.  History  Substance Use Topics  . Smoking status: Current Every Day Smoker  . Smokeless tobacco: Not on file  . Alcohol Use: Yes     drank 6 pack last night and 1 beer this am --20 oz      Review of Systems  Constitutional: Negative for fever, chills and appetite change.  Respiratory: Negative for cough and shortness of breath.   Cardiovascular: Negative for chest pain.  Gastrointestinal: Positive for nausea and abdominal pain. Negative for vomiting, diarrhea and constipation.  Genitourinary: Negative for dysuria, frequency, hematuria and flank pain.    Allergies  Review of patient's allergies indicates no known allergies.  Home Medications   Current Outpatient Rx  Name Route Sig Dispense Refill  . ALBUTEROL SULFATE HFA 108 (90 BASE) MCG/ACT IN AERS Inhalation Inhale 2 puffs into the lungs every 6 (six) hours as needed. For  shortness of breath.    . IBUPROFEN 200 MG PO TABS Oral Take 200 mg by mouth every 6 (six) hours as needed. pain      BP 129/84  Pulse 72  Temp 98 F (36.7 C) (Oral)  Resp 16  SpO2 99%  Physical Exam  Nursing note and vitals reviewed. Constitutional: He is oriented to person, place, and time. He appears well-developed and well-nourished.  HENT:  Head: Normocephalic and atraumatic.  Neck: Normal range of motion. Neck supple.       No meningeal signs  Cardiovascular: Normal rate and regular rhythm.   Pulmonary/Chest: Effort normal and breath sounds normal.  Abdominal: Soft. Bowel sounds are normal. He exhibits no distension and no mass. There is no hepatosplenomegaly. There is tenderness. There is no rebound, no guarding, no CVA tenderness, no tenderness at McBurney's point and negative Murphy's sign.       Patient has moderate diffuse tenderness greatest in the epigastric area.  Musculoskeletal: Normal range of motion. He exhibits no edema and no tenderness.  Neurological: He is alert and oriented to person, place, and time.  Skin: Skin is warm. No rash noted. He is not diaphoretic. No erythema.  Psychiatric: He has a normal mood and affect. His behavior is normal.    ED Course  Procedures  Results for orders placed during the hospital encounter of 10/20/11  CBC WITH DIFFERENTIAL      Component Value Range   WBC 5.3  4.0 -  10.5 K/uL   RBC 4.89  4.22 - 5.81 MIL/uL   Hemoglobin 15.6  13.0 - 17.0 g/dL   HCT 09.8  11.9 - 14.7 %   MCV 87.1  78.0 - 100.0 fL   MCH 31.9  26.0 - 34.0 pg   MCHC 36.6 (*) 30.0 - 36.0 g/dL   RDW 82.9  56.2 - 13.0 %   Platelets 214  150 - 400 K/uL   Neutrophils Relative 53  43 - 77 %   Neutro Abs 2.8  1.7 - 7.7 K/uL   Lymphocytes Relative 36  12 - 46 %   Lymphs Abs 1.9  0.7 - 4.0 K/uL   Monocytes Relative 8  3 - 12 %   Monocytes Absolute 0.4  0.1 - 1.0 K/uL   Eosinophils Relative 2  0 - 5 %   Eosinophils Absolute 0.1  0.0 - 0.7 K/uL   Basophils  Relative 0  0 - 1 %   Basophils Absolute 0.0  0.0 - 0.1 K/uL  COMPREHENSIVE METABOLIC PANEL      Component Value Range   Sodium 137  135 - 145 mEq/L   Potassium 3.3 (*) 3.5 - 5.1 mEq/L   Chloride 102  96 - 112 mEq/L   CO2 23  19 - 32 mEq/L   Glucose, Bld 127 (*) 70 - 99 mg/dL   BUN 11  6 - 23 mg/dL   Creatinine, Ser 8.65  0.50 - 1.35 mg/dL   Calcium 9.0  8.4 - 78.4 mg/dL   Total Protein 7.3  6.0 - 8.3 g/dL   Albumin 3.9  3.5 - 5.2 g/dL   AST 16  0 - 37 U/L   ALT 16  0 - 53 U/L   Alkaline Phosphatase 123 (*) 39 - 117 U/L   Total Bilirubin 0.3  0.3 - 1.2 mg/dL   GFR calc non Af Amer >90  >90 mL/min   GFR calc Af Amer >90  >90 mL/min  URINALYSIS, WITH MICROSCOPIC      Component Value Range   Color, Urine YELLOW  YELLOW   APPearance CLEAR  CLEAR   Specific Gravity, Urine 1.028  1.005 - 1.030   pH 5.5  5.0 - 8.0   Glucose, UA NEGATIVE  NEGATIVE mg/dL   Hgb urine dipstick NEGATIVE  NEGATIVE   Bilirubin Urine NEGATIVE  NEGATIVE   Ketones, ur NEGATIVE  NEGATIVE mg/dL   Protein, ur NEGATIVE  NEGATIVE mg/dL   Urobilinogen, UA 0.2  0.0 - 1.0 mg/dL   Nitrite NEGATIVE  NEGATIVE   Leukocytes, UA NEGATIVE  NEGATIVE   Urine-Other       Value: NO FORMED ELEMENTS SEEN ON URINE MICROSCOPIC EXAMINATION  LIPASE, BLOOD      Component Value Range   Lipase 40  11 - 59 U/L      Dg Abd Acute W/chest  10/21/2011  *RADIOLOGY REPORT*  Clinical Data: Right upper quadrant pain.  Nausea.  ACUTE ABDOMEN SERIES (ABDOMEN 2 VIEW & CHEST 1 VIEW)  Comparison: PA and lateral chest 01/12/2011.  CT abdomen and pelvis 02/15/2007.  Findings: Single view of the chest demonstrates clear lungs and normal heart size.  No pneumothorax or pleural fluid.  Two views of the abdomen show no free intraperitoneal air.  Bowel gas pattern is unremarkable.  Cholecystectomy clips noted.  IMPRESSION: No acute finding.   Original Report Authenticated By: Bernadene Bell. D'ALESSIO, M.D.      1. Gastritis   2. Alcohol abuse  MDM  2:15AM patient seen and evaluated. Patient appears in moderate discomfort. Patient does admit to alcohol use earlier today. Denies any history of pancreatitis.  Patient feeling better after medications. Labs unremarkable. Normal WBC. No concerning findings an acute abdomen. Have discussed findings with patient. Will discharge at this time with recommendations for regular antacid use. Patient also counseled about alcohol use and if you wish to have help with treatment. At this time he is not requesting help and we will provide resource guide.     Date: 10/21/2011  Rate: 49  Rhythm: sinus bradycardia  QRS Axis: normal  Intervals: normal  ST/T Wave abnormalities: normal  Conduction Disutrbances:none  Narrative Interpretation:   Old EKG Reviewed: No significant changes    Angus Seller, PA 10/21/11 0600

## 2011-12-31 ENCOUNTER — Encounter (HOSPITAL_COMMUNITY): Payer: Self-pay | Admitting: *Deleted

## 2011-12-31 ENCOUNTER — Emergency Department (HOSPITAL_COMMUNITY): Payer: Self-pay

## 2011-12-31 ENCOUNTER — Emergency Department (HOSPITAL_COMMUNITY)
Admission: EM | Admit: 2011-12-31 | Discharge: 2011-12-31 | Disposition: A | Discharge status: Home / Self Care | Payer: Self-pay | Attending: Emergency Medicine | Admitting: Emergency Medicine

## 2011-12-31 DIAGNOSIS — R55 Syncope and collapse: Secondary | ICD-10-CM | POA: Insufficient documentation

## 2011-12-31 DIAGNOSIS — F10929 Alcohol use, unspecified with intoxication, unspecified: Secondary | ICD-10-CM

## 2011-12-31 DIAGNOSIS — J45901 Unspecified asthma with (acute) exacerbation: Secondary | ICD-10-CM | POA: Insufficient documentation

## 2011-12-31 DIAGNOSIS — R42 Dizziness and giddiness: Secondary | ICD-10-CM | POA: Insufficient documentation

## 2011-12-31 DIAGNOSIS — F17200 Nicotine dependence, unspecified, uncomplicated: Secondary | ICD-10-CM | POA: Insufficient documentation

## 2011-12-31 DIAGNOSIS — J45909 Unspecified asthma, uncomplicated: Secondary | ICD-10-CM

## 2011-12-31 DIAGNOSIS — R51 Headache: Secondary | ICD-10-CM | POA: Insufficient documentation

## 2011-12-31 DIAGNOSIS — F1010 Alcohol abuse, uncomplicated: Secondary | ICD-10-CM | POA: Insufficient documentation

## 2011-12-31 LAB — URINALYSIS, ROUTINE W REFLEX MICROSCOPIC
Bilirubin Urine: NEGATIVE
Hgb urine dipstick: NEGATIVE
Ketones, ur: 15 mg/dL — AB
Specific Gravity, Urine: 1.013 (ref 1.005–1.030)
Urobilinogen, UA: 0.2 mg/dL (ref 0.0–1.0)

## 2011-12-31 LAB — COMPREHENSIVE METABOLIC PANEL
ALT: 15 U/L (ref 0–53)
AST: 17 U/L (ref 0–37)
Calcium: 9.1 mg/dL (ref 8.4–10.5)
GFR calc Af Amer: >90 mL/min (ref >90)
Glucose, Bld: 122 mg/dL — ABNORMAL HIGH (ref 70–99)
Sodium: 146 mEq/L — ABNORMAL HIGH (ref 135–145)
Total Protein: 7.1 g/dL (ref 6.0–8.3)

## 2011-12-31 LAB — CBC WITH DIFFERENTIAL/PLATELET
Basophils Absolute: 0 10*3/uL (ref 0.0–0.1)
Basophils Relative: 0 % (ref 0–1)
Eosinophils Absolute: 0 10*3/uL (ref 0.0–0.7)
Eosinophils Relative: 1 % (ref 0–5)
MCH: 32 pg (ref 26.0–34.0)
MCHC: 35.2 g/dL (ref 30.0–36.0)
MCV: 91 fL (ref 78.0–100.0)
Platelets: 202 10*3/uL (ref 150–400)
RDW: 13 % (ref 11.5–15.5)

## 2011-12-31 LAB — POCT I-STAT TROPONIN I: Troponin i, poc: 0 ng/mL (ref 0.00–0.08)

## 2011-12-31 MED ORDER — ALBUTEROL SULFATE HFA 108 (90 BASE) MCG/ACT IN AERS
2.0000 | INHALATION_SPRAY | Freq: Once | RESPIRATORY_TRACT | Status: AC
  Administered 2011-12-31: 2 via RESPIRATORY_TRACT
  Filled 2011-12-31: qty 6.7

## 2011-12-31 MED ORDER — ALBUTEROL SULFATE (5 MG/ML) 0.5% IN NEBU
5.0000 mg | INHALATION_SOLUTION | Freq: Once | RESPIRATORY_TRACT | Status: AC
  Administered 2011-12-31: 5 mg via RESPIRATORY_TRACT
  Filled 2011-12-31: qty 1

## 2011-12-31 MED ORDER — SODIUM CHLORIDE 0.9 % IV BOLUS (SEPSIS)
1000.0000 mL | Freq: Once | INTRAVENOUS | Status: AC
  Administered 2011-12-31: 1000 mL via INTRAVENOUS

## 2011-12-31 NOTE — ED Provider Notes (Signed)
History     CSN: [id number]  Arrival date & time 12/31/11  1230   First MD Initiated Contact with Patient 12/31/11 1235      Chief Complaint  Patient presents with  . Shortness of Breath  . Headache    (Consider location/radiation/quality/duration/timing/severity/associated sxs/prior treatment) HPI Jacob Gaines is a 46 y.o. male who presents with complaint of sudden onset of shortness of breath, dizziness, syncopal episode while at [employer]. States does not remember what happened. Pt states currently he is having shortness of breath, headache, generalized weakness and malaise. Pt states he drinks alcohol, but has not had any today. States hx of asthma, but this does not feel like asthma. Denies chest pain. Denies fever, chills, cough. No LE swelling.   Past Medical History  Diagnosis Date  . ETOH abuse   . Asthma     No past surgical history on file.  No family history on file.  History  Substance Use Topics  . Smoking status: Current Every Day Smoker  . Smokeless tobacco: Not on file  . Alcohol Use: Yes     Comment: Drank a 12 pack of beer and part of 1/5th liquor      Review of Systems  Constitutional: Negative for fever, chills and diaphoresis.  HENT: Negative for neck pain and neck stiffness.   Respiratory: Positive for shortness of breath. Negative for cough and chest tightness.   Cardiovascular: Negative.   Gastrointestinal: Negative.   Genitourinary: Negative for dysuria.  Musculoskeletal: Negative.   Skin: Negative.   Neurological: Positive for dizziness, syncope, weakness, light-headedness and headaches. Negative for seizures and numbness.    Allergies  Review of patient's allergies indicates no known allergies.  Home Medications  No current outpatient prescriptions on file.  BP 124/82  Pulse 112  Temp 98.2 F (36.8 C) (Oral)  Resp 20  SpO2 96%  Physical Exam  Nursing note and vitals reviewed. Constitutional: He is oriented to person,  place, and time. He appears well-developed and well-nourished.       Appears intoxicated, uncomforatable  HENT:  Head: Normocephalic and atraumatic.  Right Ear: External ear normal.  Left Ear: External ear normal.  Nose: Nose normal.  Mouth/Throat: Oropharynx is clear and moist.  Eyes: Conjunctivae normal and EOM are normal. Pupils are equal, round, and reactive to light.  Neck: Neck supple.  Cardiovascular: Regular rhythm and normal heart sounds.        tachycardic   Pulmonary/Chest: Effort normal and breath sounds normal. No respiratory distress. He has no wheezes. He has no rales.  Abdominal: Soft. Bowel sounds are normal. He exhibits no distension. There is no tenderness. There is no rebound.  Musculoskeletal: He exhibits no edema.  Neurological: He is alert and oriented to person, place, and time.  Skin: Skin is warm and dry. No erythema.    ED Course  Procedures (including critical care time)  Pt with possible fall vs syncopal episode while at [employer] today. Pt states having a headache, short of breath. Hx of asthma. Pt appears intoxicated, denies drinking today.   Results for orders placed during the hospital encounter of 12/31/11  CBC WITH DIFFERENTIAL      Component Value Range   WBC 5.1  4.0 - 10.5 K/uL   RBC 4.87  4.22 - 5.81 MIL/uL   Hemoglobin 15.6  13.0 - 17.0 g/dL   HCT 62.1  30.8 - 65.7 %   MCV 91.0  78.0 - 100.0 fL   MCH 32.0  26.0 - 34.0 pg   MCHC 35.2  30.0 - 36.0 g/dL   RDW 40.9  81.1 - 91.4 %   Platelets 202  150 - 400 K/uL   Neutrophils Relative 55  43 - 77 %   Neutro Abs 2.8  1.7 - 7.7 K/uL   Lymphocytes Relative 37  12 - 46 %   Lymphs Abs 1.9  0.7 - 4.0 K/uL   Monocytes Relative 8  3 - 12 %   Monocytes Absolute 0.4  0.1 - 1.0 K/uL   Eosinophils Relative 1  0 - 5 %   Eosinophils Absolute 0.0  0.0 - 0.7 K/uL   Basophils Relative 0  0 - 1 %   Basophils Absolute 0.0  0.0 - 0.1 K/uL  COMPREHENSIVE METABOLIC PANEL      Component Value Range   Sodium  146 (*) 135 - 145 mEq/L   Potassium 3.5  3.5 - 5.1 mEq/L   Chloride 108  96 - 112 mEq/L   CO2 23  19 - 32 mEq/L   Glucose, Bld 122 (*) 70 - 99 mg/dL   BUN 11  6 - 23 mg/dL   Creatinine, Ser 7.82  0.50 - 1.35 mg/dL   Calcium 9.1  8.4 - 95.6 mg/dL   Total Protein 7.1  6.0 - 8.3 g/dL   Albumin 3.9  3.5 - 5.2 g/dL   AST 17  0 - 37 U/L   ALT 15  0 - 53 U/L   Alkaline Phosphatase 108  39 - 117 U/L   Total Bilirubin 0.2 (*) 0.3 - 1.2 mg/dL   GFR calc non Af Amer >90  >90 mL/min   GFR calc Af Amer >90  >90 mL/min  ETHANOL      Component Value Range   Alcohol, Ethyl (B) 211 (*) 0 - 11 mg/dL  D-DIMER, QUANTITATIVE      Component Value Range   D-Dimer, Quant 0.30  0.00 - 0.48 ug/mL-FEU  POCT I-STAT TROPONIN I      Component Value Range   Troponin i, poc 0.00  0.00 - 0.08 ng/mL   Comment 3            Dg Chest 2 View  12/31/2011  *RADIOLOGY REPORT*  Clinical Data: Shortness of breath, headache  CHEST - 2 VIEW  Comparison: 06/03/2011  Findings: Cardiomediastinal silhouette is stable. No acute infiltrate or pleural effusion.  No pulmonary edema.  Bony thorax is unremarkable.  IMPRESSION: No active disease.   Original Report Authenticated By: Natasha Mead, M.D.      Date: 12/31/2011  Rate: 95  Rhythm: normal sinus rhythm  QRS Axis: normal  Intervals: normal  ST/T Wave abnormalities: normal  Conduction Disutrbances:none  Narrative Interpretation:   Old EKG Reviewed: none available    1. Asthma   2. Alcohol intoxication   3. Headache       MDM  PT with a syncopal episode ?  While walking at [employer]. Pt with chronic alcohol problem, here appears intoxicated which he first denied. Labs, ECG, chest x-ray negative. Pt complained of SOB. Negative troponin and d dimer, doubt ACT given no significant risk factors. Alcohol 211. Pt hydrated. He was ambulated in the hallway. His headache and sob improved with albuterol and fluids. His VS are normal. Pt is discharged home with follow up.    Filed Vitals:   12/31/11 1500 12/31/11 1526 12/31/11 1545 12/31/11 1600  BP: 110/73 124/73 111/65 115/81  Pulse: 104 96 96 98  Temp:      TempSrc:      Resp: 17 15 20 23   SpO2: 96% 99% 98% 97%           Lottie Mussel, PA 01/01/12 [phone]

## 2011-12-31 NOTE — ED Notes (Signed)
Pt given a coat from the ED coat collection prior to discharge

## 2011-12-31 NOTE — ED Notes (Signed)
As he began to sit up to ambulate states that his head still hurts, feels shaky, legs feel numb but denies any nausea.

## 2011-12-31 NOTE — ED Notes (Signed)
Pt states that he is hungry.  

## 2011-12-31 NOTE — ED Notes (Signed)
Per EMS pt was at [facility] when he began to have a SOB, dizziness, and Headache.  No distress noted.  Pt states that he had Vodka last PM but none today.  Resp symmetrical and unlabored at the present.

## 2012-01-01 NOTE — ED Provider Notes (Signed)
Medical screening examination/treatment/procedure(s) were performed by non-physician practitioner and as supervising physician I was immediately available for consultation/collaboration.   Gavin Pound. Oletta Lamas, MD 01/01/12 [phone]

## 2012-02-23 ENCOUNTER — Emergency Department (HOSPITAL_COMMUNITY)
Admission: EM | Admit: 2012-02-23 | Discharge: 2012-02-24 | Disposition: A | Discharge status: Home / Self Care | Payer: Self-pay | Attending: Emergency Medicine | Admitting: Emergency Medicine

## 2012-02-23 ENCOUNTER — Encounter (HOSPITAL_COMMUNITY): Payer: Self-pay | Admitting: Emergency Medicine

## 2012-02-23 ENCOUNTER — Emergency Department (HOSPITAL_COMMUNITY): Payer: Self-pay

## 2012-02-23 DIAGNOSIS — F1910 Other psychoactive substance abuse, uncomplicated: Secondary | ICD-10-CM | POA: Insufficient documentation

## 2012-02-23 DIAGNOSIS — F10929 Alcohol use, unspecified with intoxication, unspecified: Secondary | ICD-10-CM

## 2012-02-23 DIAGNOSIS — F17200 Nicotine dependence, unspecified, uncomplicated: Secondary | ICD-10-CM | POA: Insufficient documentation

## 2012-02-23 DIAGNOSIS — R51 Headache: Secondary | ICD-10-CM | POA: Insufficient documentation

## 2012-02-23 DIAGNOSIS — F1010 Alcohol abuse, uncomplicated: Secondary | ICD-10-CM | POA: Insufficient documentation

## 2012-02-23 DIAGNOSIS — F29 Unspecified psychosis not due to a substance or known physiological condition: Secondary | ICD-10-CM | POA: Insufficient documentation

## 2012-02-23 DIAGNOSIS — J45909 Unspecified asthma, uncomplicated: Secondary | ICD-10-CM | POA: Insufficient documentation

## 2012-02-23 LAB — POCT I-STAT, CHEM 8
BUN: 10 mg/dL (ref 6–23)
Chloride: 109 mEq/L (ref 96–112)
Creatinine, Ser: 0.8 mg/dL (ref 0.50–1.35)
Potassium: 3.1 mEq/L — ABNORMAL LOW (ref 3.5–5.1)
Sodium: 146 mEq/L — ABNORMAL HIGH (ref 135–145)

## 2012-02-23 LAB — BASIC METABOLIC PANEL
CO2: 21 mEq/L (ref 19–32)
Glucose, Bld: 152 mg/dL — ABNORMAL HIGH (ref 70–99)
Potassium: 2.9 mEq/L — ABNORMAL LOW (ref 3.5–5.1)
Sodium: 141 mEq/L (ref 135–145)

## 2012-02-23 LAB — CBC
HCT: 39.5 % (ref 39.0–52.0)
Hemoglobin: 14.2 g/dL (ref 13.0–17.0)
MCH: 31.9 pg (ref 26.0–34.0)
RBC: 4.45 MIL/uL (ref 4.22–5.81)

## 2012-02-23 LAB — POCT I-STAT TROPONIN I

## 2012-02-23 LAB — ETHANOL: Alcohol, Ethyl (B): 166 mg/dL — ABNORMAL HIGH (ref 0–11)

## 2012-02-23 MED ORDER — SODIUM CHLORIDE 0.9 % IV BOLUS (SEPSIS)
1000.0000 mL | Freq: Once | INTRAVENOUS | Status: AC
  Administered 2012-02-23: 1000 mL via INTRAVENOUS

## 2012-02-23 NOTE — ED Notes (Signed)
Patient transported to CT 

## 2012-02-23 NOTE — ED Notes (Signed)
Pt received 324 asa and nitro x3 en route

## 2012-02-23 NOTE — ED Notes (Signed)
Nurse attempted to get pt to urinate, pt was not able to stand up on his own, nurse called nurse nurse tech to assist nurse trying to get pt up to use a bedside urinal. Pt was hard to arouse, and fell asleep while talking to pt. Pt fell asleep whilst standing up, nurse and nurse tech placed pt back in bed. Pt was unable to void at this time.

## 2012-02-23 NOTE — ED Notes (Signed)
Per EMS, Pt was initially complaining of stroke symptoms, but when EMS arrived and got pt onto truck he started complaining of chest pain. Pt has no neuro deficits. Pt is not having any n/v/d, no sweating, PERRLA, Chest pain radiating to left arm, having left arm numbness. EMS suspects ETOH onboard. No abnormal gait, no facial droop, no slurred speech. A/o x4, Vitals stable, BP 130/70, sinus tach at 120, CBG 107, 98% RA. 18 G L forearm.

## 2012-02-23 NOTE — ED Provider Notes (Signed)
History     CSN: [id number]  Arrival date & time 02/23/12  2130   First MD Initiated Contact with Patient 02/23/12 2228      Chief Complaint  Patient presents with  . Chest Pain    (Consider location/radiation/quality/duration/timing/severity/associated sxs/prior treatment) Patient is a 47 y.o. male presenting with headaches. The history is provided by the patient.  Headache  This is a new problem. The current episode started 6 to 12 hours ago. The problem occurs constantly. The problem has not changed since onset.The headache is associated with nothing. The pain is located in the left unilateral region. The quality of the pain is described as dull. The pain is mild. The pain does not radiate. Pertinent negatives include no fever, no palpitations, no shortness of breath, no nausea and no vomiting. He has tried nothing for the symptoms.    Past Medical History  Diagnosis Date  . ETOH abuse   . Asthma     History reviewed. No pertinent past surgical history.  No family history on file.  History  Substance Use Topics  . Smoking status: Current Every Day Smoker  . Smokeless tobacco: Not on file  . Alcohol Use: Yes     Comment: Drank a 12 pack of beer and part of 1/5th liquor      Review of Systems  Constitutional: Negative for fever and fatigue.  HENT: Negative for congestion, rhinorrhea and postnasal drip.   Eyes: Negative for photophobia and visual disturbance.  Respiratory: Negative for chest tightness, shortness of breath and wheezing.   Cardiovascular: Negative for chest pain, palpitations and leg swelling.  Gastrointestinal: Negative for nausea, vomiting, abdominal pain and diarrhea.  Genitourinary: Negative for urgency, frequency and difficulty urinating.  Musculoskeletal: Negative for back pain and arthralgias.  Skin: Negative for rash and wound.  Neurological: Positive for headaches. Negative for weakness.  Psychiatric/Behavioral: Positive for confusion.  Negative for agitation.    Allergies  Review of patient's allergies indicates no known allergies.  Home Medications  No current outpatient prescriptions on file.  BP 116/69  Temp 98.3 F (36.8 C) (Oral)  Resp 20  SpO2 94%  Physical Exam  Nursing note and vitals reviewed. Constitutional: He appears well-developed and well-nourished. No distress.  HENT:  Head: Normocephalic and atraumatic.  Mouth/Throat: Oropharynx is clear and moist.  Eyes: EOM are normal. Pupils are equal, round, and reactive to light.  Neck: Normal range of motion. Neck supple.       No nuchal rigidity.   Cardiovascular: Normal rate, regular rhythm, normal heart sounds and intact distal pulses.   Pulmonary/Chest: Effort normal and breath sounds normal. He has no wheezes. He has no rales.  Abdominal: Soft. Bowel sounds are normal. He exhibits no distension. There is no tenderness. There is no rebound and no guarding.  Musculoskeletal: Normal range of motion. He exhibits no edema and no tenderness.  Lymphadenopathy:    He has no cervical adenopathy.  Neurological: He is alert. He has normal strength. He displays normal reflexes. No cranial nerve deficit or sensory deficit. He exhibits normal muscle tone. Coordination normal. GCS eye subscore is 3. GCS verbal subscore is 5. GCS motor subscore is 6.       Awakes to voice but is otherwise sleepy. Obeys commands but tends to fall back asleep  Skin: Skin is warm and dry. No rash noted.  Psychiatric: He has a normal mood and affect. His behavior is normal.    ED Course  Procedures (including critical care  time)   Date: 02/24/2012  Rate: 110  Rhythm: sinus tachycardia  QRS Axis: normal  Intervals: normal  ST/T Wave abnormalities: nonspecific ST changes  Conduction Disutrbances:none  Narrative Interpretation:   Old EKG Reviewed: unchanged and changes noted sinus tachycardia now present. Non-specific ST-T wave changes appear unchanged.     Labs Reviewed    BASIC METABOLIC PANEL - Abnormal; Notable for the following:    Potassium 2.9 (*)     Glucose, Bld 152 (*)     All other components within normal limits  GLUCOSE, CAPILLARY - Abnormal; Notable for the following:    Glucose-Capillary 148 (*)     All other components within normal limits  POCT I-STAT, CHEM 8 - Abnormal; Notable for the following:    Sodium 146 (*)     Potassium 3.1 (*)     Glucose, Bld 143 (*)     All other components within normal limits  ETHANOL - Abnormal; Notable for the following:    Alcohol, Ethyl (B) 166 (*)     All other components within normal limits  CBC  POCT I-STAT TROPONIN I  URINE RAPID DRUG SCREEN (HOSP PERFORMED)   Ct Head (brain) Wo Contrast  02/23/2012  *RADIOLOGY REPORT*  Clinical Data: Stroke symptoms and chest pain.  CT HEAD WITHOUT CONTRAST  Technique:  Contiguous axial images were obtained from the base of the skull through the vertex without contrast.  Comparison: 06/24/2011  Findings: No evidence for acute hemorrhage, mass lesion, midline shift, hydrocephalus or large infarct.  There is chronic left maxillary sinus disease.  No acute bony abnormality.  IMPRESSION: No acute intracranial abnormality.  Left maxillary sinus disease.   Original Report Authenticated By: Richarda Overlie, M.D.    Dg Chest Port 1 View  02/23/2012  *RADIOLOGY REPORT*  Clinical Data: Chest pain.  PORTABLE CHEST - 1 VIEW  Comparison: 12/31/2011  Findings: Single view of the chest was obtained.  Lungs are clear without airspace disease or edema. Heart and mediastinum are within normal limits.  Bony thorax is intact.  IMPRESSION: No acute chest findings.   Original Report Authenticated By: Richarda Overlie, M.D.      1. Headache   2. Alcohol intoxication       MDM  70M with ho etoh abuse and asthma who presents with a left-sided headache and alcohol intoxication. Initially, pt told nurse that he had chest pain and he was having left sided arm numbness so a chest pain and stroke  protocol was initiated. To me, pt appears sleepy with ?intoxication. No neurologic deficits on my exam. Pt only complains of left sided headache. No fever. No leukocytosis. Doubt meningitis, encephalitis, CVA, ACS. Will obtain alcohol level and UDS. Given IVF.  Head CT negative for acute pathology. Does show some left maxillary sinus disease. EKG without ischemic changes. Troponin negative. Hypokalemia 2.9. Etoh level 166. Will continue IVF hydration and replace potassium. Feel drowsiness likely etoh and doubt serious etiology at this time but would like for him to return to his neurologic baseline to re-address any serious complaints he may have.   Pt now more awake and talkative. Ambulating without difficulty. Tolerated PO. Pt only complains that the back of his head hurt. Denies ever having any chest pain or neurologic complaints. Appears back to neurologic baseline. Doubt serious etiology at this time such as ACS, CVA, meningitis, encephalitis, aortic dissection. Likely etoh intoxication and maybe viral syndrome. Stable for d/c home. Return precautions given.  Johnnette Gourd, MD 02/24/12 [phone]

## 2012-02-23 NOTE — ED Notes (Signed)
Pt returned from CT °

## 2012-02-24 MED ORDER — POTASSIUM CHLORIDE CRYS ER 20 MEQ PO TBCR
40.0000 meq | EXTENDED_RELEASE_TABLET | Freq: Once | ORAL | Status: AC
  Administered 2012-02-24: 40 meq via ORAL
  Filled 2012-02-24: qty 2

## 2012-02-24 MED ORDER — POTASSIUM CHLORIDE 10 MEQ/100ML IV SOLN: 10.0000 meq | Freq: Once | INTRAVENOUS | Status: DC

## 2012-02-24 MED ORDER — SODIUM CHLORIDE 0.9 % IV SOLN
Freq: Once | INTRAVENOUS | Status: AC
  Administered 2012-02-24: 75 mL/h via INTRAVENOUS

## 2012-02-24 MED ORDER — POTASSIUM CHLORIDE 10 MEQ/100ML IV SOLN
10.0000 meq | INTRAVENOUS | Status: DC
  Administered 2012-02-24: 10 meq via INTRAVENOUS
  Filled 2012-02-24: qty 100

## 2012-02-24 MED ORDER — IBUPROFEN 400 MG PO TABS
600.0000 mg | ORAL_TABLET | Freq: Once | ORAL | Status: AC
  Administered 2012-02-24: 600 mg via ORAL
  Filled 2012-02-24: qty 1

## 2012-02-24 MED ORDER — POTASSIUM CHLORIDE IN NACL 20-0.9 MEQ/L-% IV SOLN
Freq: Once | INTRAVENOUS | Status: DC
  Filled 2012-02-24: qty 1000

## 2012-02-24 NOTE — ED Notes (Signed)
Pt ambulated to the bathroom.  

## 2012-02-26 NOTE — ED Provider Notes (Signed)
I saw and evaluated the patient, reviewed the resident's note and I agree with the findings and plan and agree with their ECG interpretation. Patient with chest pain, and left-sided numbness. Some sleepiness. Patient has alcohol intoxication. Improved after treatment of a discharge home  Juliet Rude. Rubin Payor, MD 02/26/12 1534

## 2012-06-16 ENCOUNTER — Emergency Department (HOSPITAL_COMMUNITY): Payer: Self-pay

## 2012-06-16 ENCOUNTER — Emergency Department (HOSPITAL_COMMUNITY)
Admission: EM | Admit: 2012-06-16 | Discharge: 2012-06-17 | Disposition: A | Discharge status: Home / Self Care | Payer: Self-pay | Attending: Emergency Medicine | Admitting: Emergency Medicine

## 2012-06-16 DIAGNOSIS — F10929 Alcohol use, unspecified with intoxication, unspecified: Secondary | ICD-10-CM

## 2012-06-16 DIAGNOSIS — R404 Transient alteration of awareness: Secondary | ICD-10-CM | POA: Insufficient documentation

## 2012-06-16 DIAGNOSIS — J45909 Unspecified asthma, uncomplicated: Secondary | ICD-10-CM | POA: Insufficient documentation

## 2012-06-16 DIAGNOSIS — R000 Tachycardia, unspecified: Secondary | ICD-10-CM | POA: Insufficient documentation

## 2012-06-16 DIAGNOSIS — F17200 Nicotine dependence, unspecified, uncomplicated: Secondary | ICD-10-CM | POA: Insufficient documentation

## 2012-06-16 DIAGNOSIS — F1010 Alcohol abuse, uncomplicated: Secondary | ICD-10-CM | POA: Insufficient documentation

## 2012-06-16 LAB — URINALYSIS, ROUTINE W REFLEX MICROSCOPIC
Leukocytes, UA: NEGATIVE
Nitrite: NEGATIVE
Specific Gravity, Urine: 1.023 (ref 1.005–1.030)
Urobilinogen, UA: 0.2 mg/dL (ref 0.0–1.0)
pH: 5 (ref 5.0–8.0)

## 2012-06-16 LAB — CBC WITH DIFFERENTIAL/PLATELET
Eosinophils Absolute: 0 10*3/uL (ref 0.0–0.7)
Eosinophils Relative: 0 % (ref 0–5)
HCT: 40.9 % (ref 39.0–52.0)
Lymphs Abs: 2.2 10*3/uL (ref 0.7–4.0)
MCH: 31.9 pg (ref 26.0–34.0)
MCV: 89.3 fL (ref 78.0–100.0)
Monocytes Absolute: 0.7 10*3/uL (ref 0.1–1.0)
Platelets: 174 10*3/uL (ref 150–400)
RBC: 4.58 MIL/uL (ref 4.22–5.81)
RDW: 13.4 % (ref 11.5–15.5)

## 2012-06-16 LAB — COMPREHENSIVE METABOLIC PANEL
ALT: 24 U/L (ref 0–53)
CO2: 20 mEq/L (ref 19–32)
Calcium: 8.7 mg/dL (ref 8.4–10.5)
Creatinine, Ser: 1.21 mg/dL (ref 0.50–1.35)
GFR calc Af Amer: 81 mL/min — ABNORMAL LOW (ref >90)
GFR calc non Af Amer: 70 mL/min — ABNORMAL LOW (ref >90)
Glucose, Bld: 130 mg/dL — ABNORMAL HIGH (ref 70–99)
Sodium: 146 mEq/L — ABNORMAL HIGH (ref 135–145)
Total Protein: 6.6 g/dL (ref 6.0–8.3)

## 2012-06-16 LAB — RAPID URINE DRUG SCREEN, HOSP PERFORMED
Amphetamines: NOT DETECTED
Benzodiazepines: NOT DETECTED
Opiates: NOT DETECTED

## 2012-06-16 LAB — POCT I-STAT TROPONIN I: Troponin i, poc: 0 ng/mL (ref 0.00–0.08)

## 2012-06-16 LAB — URINE MICROSCOPIC-ADD ON

## 2012-06-16 LAB — ETHANOL: Alcohol, Ethyl (B): 230 mg/dL — ABNORMAL HIGH (ref 0–11)

## 2012-06-16 MED ORDER — SODIUM CHLORIDE 0.9 % IV SOLN
Freq: Once | INTRAVENOUS | Status: AC
  Administered 2012-06-16: 18:00:00 via INTRAVENOUS
  Filled 2012-06-16: qty 1000

## 2012-06-16 NOTE — ED Provider Notes (Signed)
History     CSN: [id number]  Arrival date & time 06/16/12  1611   First MD Initiated Contact with Patient 06/16/12 1620      Chief Complaint  Patient presents with  . Alcohol Intoxication  . Loss of Consciousness    (Consider location/radiation/quality/duration/timing/severity/associated sxs/prior treatment) HPI  History was obtained by EMS  Pt is a 47 yo M PMHx significant for ETOH abuse presenting to the ED via EMS after being found sitting on the side of the road by his friend who proceeded to call 911. Patient reported drinking "a couple of beers" and in turn felt dizzy and had generalized weakness. He denied any pain or other complaints. EMS stated patient was AO x 4 w/o neurofocal deficit. Patient is level V caveat.   Past Medical History  Diagnosis Date  . ETOH abuse   . Asthma     No past surgical history on file.  No family history on file.  History  Substance Use Topics  . Smoking status: Current Every Day Smoker  . Smokeless tobacco: Not on file  . Alcohol Use: Yes     Comment: Drank a 12 pack of beer and part of 1/5th liquor      Review of Systems  Unable to perform ROS: Mental status change    Allergies  Review of patient's allergies indicates no known allergies.  Home Medications  No current outpatient prescriptions on file.  BP 123/83  Pulse 82  Temp(Src) 98.4 F (36.9 C) (Oral)  Resp 18  SpO2 97%  Physical Exam  Constitutional: He appears well-developed and well-nourished. No distress.  HENT:  Head: Normocephalic and atraumatic.  Eyes: Conjunctivae are normal. Pupils are equal, round, and reactive to light. Right eye exhibits no discharge. Left eye exhibits no discharge.  Neck: Neck supple.  Cardiovascular: Regular rhythm and normal heart sounds.  Tachycardia present.   Pulses:      Radial pulses are 2+ on the right side, and 2+ on the left side.       Posterior tibial pulses are 2+ on the right side, and 2+ on the left side.   Pulmonary/Chest: Effort normal and breath sounds normal.  Abdominal: Soft. Bowel sounds are normal.  Lymphadenopathy:    He has no cervical adenopathy.  Neurological:  Gag reflex present.    Skin: He is not diaphoretic.    ED Course  Procedures (including critical care time)  After receiving some IVF patient is AAOx3. He is properly communicating and answering all questions. Patient states that he is unsure why he is in the ER, he does not remember his friend calling 911 and EMS bringing him into the ED. He does remember drinking ETOH earlier in the day, but denies any RD use. He states he is not in any pain and has no complaints. No neurofocal deficits appreciated on re-examination.     Date: 06/16/2012  Rate: 111  Rhythm: sinus tachycardia  QRS Axis: normal  Intervals: normal  ST/T Wave abnormalities: normal  Conduction Disutrbances:none  Narrative Interpretation:   Old EKG Reviewed: old EKG normal sinus rhythm    Labs Reviewed  COMPREHENSIVE METABOLIC PANEL - Abnormal; Notable for the following:    Sodium 146 (*)    Potassium 3.2 (*)    Glucose, Bld 130 (*)    Albumin 3.2 (*)    GFR calc non Af Amer 70 (*)    GFR calc Af Amer 81 (*)    All other components within normal limits  URINALYSIS, ROUTINE W REFLEX MICROSCOPIC - Abnormal; Notable for the following:    Hgb urine dipstick SMALL (*)    Ketones, ur 15 (*)    All other components within normal limits  ETHANOL - Abnormal; Notable for the following:    Alcohol, Ethyl (B) 230 (*)    All other components within normal limits  URINE MICROSCOPIC-ADD ON - Abnormal; Notable for the following:    Casts HYALINE CASTS (*)    All other components within normal limits  URINE RAPID DRUG SCREEN (HOSP PERFORMED)  CBC WITH DIFFERENTIAL  POCT I-STAT TROPONIN I   Ct Head Wo Contrast  06/16/2012   *RADIOLOGY REPORT*  Clinical Data:  Alcohol intoxication with loss of consciousness.  CT HEAD WITHOUT CONTRAST CT CERVICAL SPINE  WITHOUT CONTRAST  Technique:  Multidetector CT imaging of the head and cervical spine was performed following the standard protocol without intravenous contrast.  Multiplanar CT image reconstructions of the cervical spine were also generated.  Comparison:  Most recent CT head 02/23/2012.  Most recent CT cervical 06/03/2011.  CT HEAD  Findings: There is no evidence for acute infarction, intracranial hemorrhage, mass lesion, hydrocephalus, or extra-axial fluid. There is no atrophy or white matter disease.  The calvarium is intact.  Moderate sinus disease is present similar to priors. No change from prior normal intracranial exam.  IMPRESSION: No acute intracranial findings.  CT CERVICAL SPINE  Findings: There is no visible cervical spine fracture or prevertebral soft tissue swelling. Anatomic alignment is present. Mild disc space narrowing C6-7 and C7-T1.  Normally aligned facets. Craniocervical junction unremarkable. No visible neck masses.  Lung apices clear.  Similar appearance to priors.  IMPRESSION: Mild degenerative change.  No acute findings.   Original Report Authenticated By: Davonna Belling, M.D.   Ct Cervical Spine Wo Contrast  06/16/2012   *RADIOLOGY REPORT*  Clinical Data:  Alcohol intoxication with loss of consciousness.  CT HEAD WITHOUT CONTRAST CT CERVICAL SPINE WITHOUT CONTRAST  Technique:  Multidetector CT imaging of the head and cervical spine was performed following the standard protocol without intravenous contrast.  Multiplanar CT image reconstructions of the cervical spine were also generated.  Comparison:  Most recent CT head 02/23/2012.  Most recent CT cervical 06/03/2011.  CT HEAD  Findings: There is no evidence for acute infarction, intracranial hemorrhage, mass lesion, hydrocephalus, or extra-axial fluid. There is no atrophy or white matter disease.  The calvarium is intact.  Moderate sinus disease is present similar to priors. No change from prior normal intracranial exam.  IMPRESSION: No  acute intracranial findings.  CT CERVICAL SPINE  Findings: There is no visible cervical spine fracture or prevertebral soft tissue swelling. Anatomic alignment is present. Mild disc space narrowing C6-7 and C7-T1.  Normally aligned facets. Craniocervical junction unremarkable. No visible neck masses.  Lung apices clear.  Similar appearance to priors.  IMPRESSION: Mild degenerative change.  No acute findings.   Original Report Authenticated By: Davonna Belling, M.D.     1. Alcohol abuse   2. Alcohol intoxication       MDM  PT with a questionable syncopal episode as he was found slumped sitting on the side of the road by a friend after drinking several beers this evening. Pt with chronic alcohol problem, here appears intoxicated and was at first unable to communicate d/t intoxication, symptoms resolved w/ IVF. Labs, troponin, ECG, CT head and neck negative. Pt w/o any complaints. Alcohol 230. Pt hydrated. VSS. At time of shift change, still waiting for  patient to finish IV hydration to ambulate patient. Patient care was transferred to [facility], PA-C with the disposition to discharge patient once he was able to ambulate. Patient d/w with Dr. Fredderick Phenix, agrees with plan. Patient stable at time of shift change.          Jeannetta Ellis, PA-C 06/17/12 1133

## 2012-06-16 NOTE — ED Notes (Signed)
Per EMS: Pt found sitting on side of the street. Reports to drinking "a couple of beers" resulting in generalized weakness/dizziness.  AO x4.  Denies pain. 104/70.  95% RA. 112 ST. BG 130.

## 2012-06-16 NOTE — ED Notes (Signed)
Pt groaning, unable to respond verbally. Smells strongly of ETOH. Pt placed on monitor, VSS. MD Belfi at bedside.

## 2012-06-17 NOTE — ED Provider Notes (Signed)
Patient care assumed from [facility], PA-C at shift change. Patient received banana bag and IVF bolus in ED. Patient ambulates without difficulty. He is alert and oriented and hemodynamically stable. Appropriate for d/c with return indications.    Filed Vitals:   06/16/12 2045 06/16/12 2100 06/16/12 2115 06/17/12 0324  BP: 98/63 101/63 104/64 123/83  Pulse: 71 66 70 82  Temp:    98.4 F (36.9 C)  TempSrc:    Oral  Resp: 16 15 14 18   SpO2: 99% 98% 99% 97%     Antony Madura, PA-C 06/17/12 0631

## 2012-06-17 NOTE — ED Provider Notes (Signed)
Medical screening examination/treatment/procedure(s) were conducted as a shared visit with non-physician practitioner(s) and myself.  I personally evaluated the patient during the encounter   Rolan Bucco, MD 06/17/12 1501

## 2012-06-22 NOTE — ED Provider Notes (Signed)
Medical screening examination/treatment/procedure(s) were performed by non-physician practitioner and as supervising physician I was immediately available for consultation/collaboration.   Aime Meloche, MD 06/22/12 2119 

## 2012-07-06 ENCOUNTER — Emergency Department (HOSPITAL_COMMUNITY): Payer: Self-pay

## 2012-07-06 ENCOUNTER — Encounter (HOSPITAL_COMMUNITY): Payer: Self-pay

## 2012-07-06 ENCOUNTER — Emergency Department (HOSPITAL_COMMUNITY)
Admission: EM | Admit: 2012-07-06 | Discharge: 2012-07-07 | Disposition: A | Discharge status: Home / Self Care | Payer: Self-pay | Attending: Emergency Medicine | Admitting: Emergency Medicine

## 2012-07-06 DIAGNOSIS — R109 Unspecified abdominal pain: Secondary | ICD-10-CM | POA: Insufficient documentation

## 2012-07-06 DIAGNOSIS — R10816 Epigastric abdominal tenderness: Secondary | ICD-10-CM | POA: Insufficient documentation

## 2012-07-06 DIAGNOSIS — F17200 Nicotine dependence, unspecified, uncomplicated: Secondary | ICD-10-CM | POA: Insufficient documentation

## 2012-07-06 DIAGNOSIS — F1010 Alcohol abuse, uncomplicated: Secondary | ICD-10-CM | POA: Insufficient documentation

## 2012-07-06 DIAGNOSIS — F10920 Alcohol use, unspecified with intoxication, uncomplicated: Secondary | ICD-10-CM

## 2012-07-06 DIAGNOSIS — J45909 Unspecified asthma, uncomplicated: Secondary | ICD-10-CM | POA: Insufficient documentation

## 2012-07-06 LAB — URINALYSIS, ROUTINE W REFLEX MICROSCOPIC
Bilirubin Urine: NEGATIVE
Glucose, UA: NEGATIVE mg/dL
Hgb urine dipstick: NEGATIVE
Ketones, ur: NEGATIVE mg/dL
Protein, ur: NEGATIVE mg/dL
pH: 5 (ref 5.0–8.0)

## 2012-07-06 LAB — CBC WITH DIFFERENTIAL/PLATELET
Basophils Relative: 0 % (ref 0–1)
Eosinophils Absolute: 0 10*3/uL (ref 0.0–0.7)
Hemoglobin: 15.2 g/dL (ref 13.0–17.0)
Lymphs Abs: 2 10*3/uL (ref 0.7–4.0)
MCH: 31.8 pg (ref 26.0–34.0)
MCHC: 35.1 g/dL (ref 30.0–36.0)
Monocytes Relative: 7 % (ref 3–12)
Neutro Abs: 3.2 10*3/uL (ref 1.7–7.7)
Neutrophils Relative %: 58 % (ref 43–77)
Platelets: 187 10*3/uL (ref 150–400)
RBC: 4.78 MIL/uL (ref 4.22–5.81)

## 2012-07-06 LAB — COMPREHENSIVE METABOLIC PANEL
ALT: 27 U/L (ref 0–53)
Albumin: 3.5 g/dL (ref 3.5–5.2)
Alkaline Phosphatase: 106 U/L (ref 39–117)
BUN: 12 mg/dL (ref 6–23)
Chloride: 107 mEq/L (ref 96–112)
Potassium: 3.5 mEq/L (ref 3.5–5.1)
Sodium: 143 mEq/L (ref 135–145)
Total Bilirubin: 0.2 mg/dL — ABNORMAL LOW (ref 0.3–1.2)
Total Protein: 6.9 g/dL (ref 6.0–8.3)

## 2012-07-06 MED ORDER — IOHEXOL 300 MG/ML  SOLN
100.0000 mL | Freq: Once | INTRAMUSCULAR | Status: AC | PRN
  Administered 2012-07-06: 100 mL via INTRAVENOUS

## 2012-07-06 MED ORDER — THIAMINE HCL 100 MG/ML IJ SOLN
Freq: Once | INTRAVENOUS | Status: AC
  Administered 2012-07-06: 15:00:00 via INTRAVENOUS
  Filled 2012-07-06: qty 1000

## 2012-07-06 MED ORDER — IOHEXOL 300 MG/ML  SOLN
50.0000 mL | Freq: Once | INTRAMUSCULAR | Status: AC | PRN
  Administered 2012-07-06: 50 mL via ORAL

## 2012-07-06 NOTE — Progress Notes (Signed)
[facility] has seen patient and provided him with a OC application. ° °

## 2012-07-06 NOTE — ED Notes (Signed)
[id number]<AV> Expected date:<BR> Expected time:<BR> Means of arrival:<BR> Comments:<BR> ETOH

## 2012-07-06 NOTE — ED Notes (Signed)
Patient transported to CT 

## 2012-07-06 NOTE — ED Notes (Signed)
Per EMS- Patient' brother called EMS and reported that the patient drank 2 40-oz beers this AM. Patient moans, but does not speak. Patient is responsive to painful stimuli.

## 2012-07-06 NOTE — ED Provider Notes (Signed)
History     CSN: [id number]  Arrival date & time 07/06/12  1352   First MD Initiated Contact with Patient 07/06/12 1431      Chief Complaint  Patient presents with  . Alcohol Intoxication    (Consider location/radiation/quality/duration/timing/severity/associated sxs/prior treatment) HPI Comments: Patient presents to the ER for evaluation of intoxication. Patient reportedly drank 240 ounce beers this morning and mother had trouble waking him up. Upon arrival to the ER, patient is sleepy but easily arousable. Answers questions appropriately. He reports that he has been experiencing pain across his upper abdomen since yesterday. No vomiting, constipation or diarrhea. Patient is chest pain or shortness of breath.  Patient is a 47 y.o. male presenting with intoxication.  Alcohol Intoxication Associated symptoms include abdominal pain. Pertinent negatives include no chest pain and no shortness of breath.    Past Medical History  Diagnosis Date  . ETOH abuse   . Asthma     History reviewed. No pertinent past surgical history.  No family history on file.  History  Substance Use Topics  . Smoking status: Current Every Day Smoker  . Smokeless tobacco: Never Used  . Alcohol Use: Yes     Comment: drinks daily      Review of Systems  Constitutional: Negative for fever.  Respiratory: Negative for shortness of breath.   Cardiovascular: Negative for chest pain.  Gastrointestinal: Positive for abdominal pain.  All other systems reviewed and are negative.    Allergies  Review of patient's allergies indicates no known allergies.  Home Medications  No current outpatient prescriptions on file.  BP 111/63  Pulse 112  Temp(Src) 98.4 F (36.9 C) (Oral)  Resp 20  SpO2 94%  Physical Exam  Constitutional: He is oriented to person, place, and time. He appears well-developed and well-nourished. No distress.  HENT:  Head: Normocephalic and atraumatic.  Right Ear: Hearing normal.   Left Ear: Hearing normal.  Nose: Nose normal.  Mouth/Throat: Oropharynx is clear and moist and mucous membranes are normal.  Eyes: Conjunctivae and EOM are normal. Pupils are equal, round, and reactive to light.  Neck: Normal range of motion. Neck supple.  Cardiovascular: Regular rhythm, S1 normal and S2 normal.  Exam reveals no gallop and no friction rub.   No murmur heard. Pulmonary/Chest: Effort normal and breath sounds normal. No respiratory distress. He exhibits no tenderness.  Abdominal: Soft. Normal appearance and bowel sounds are normal. There is no hepatosplenomegaly. There is tenderness in the epigastric area. There is no rebound, no guarding, no tenderness at McBurney's point and negative Murphy's sign. No hernia.  Musculoskeletal: Normal range of motion.  Neurological: He is alert and oriented to person, place, and time. He has normal strength. No cranial nerve deficit or sensory deficit. Coordination normal. GCS eye subscore is 4. GCS verbal subscore is 5. GCS motor subscore is 6.  Skin: Skin is warm, dry and intact. No rash noted. No cyanosis.  Psychiatric: He has a normal mood and affect. His speech is normal and behavior is normal. Thought content normal.    ED Course  Procedures (including critical care time)  Labs Reviewed  COMPREHENSIVE METABOLIC PANEL - Abnormal; Notable for the following:    Glucose, Bld 119 (*)    Calcium 8.3 (*)    Total Bilirubin 0.2 (*)    All other components within normal limits  ETHANOL - Abnormal; Notable for the following:    Alcohol, Ethyl (B) 333 (*)    All other components within normal  limits  CBC WITH DIFFERENTIAL  URINALYSIS, ROUTINE W REFLEX MICROSCOPIC  LIPASE, BLOOD   No results found.   Diagnosis: 1. Abdominal pain 2. Alcohol intoxication    MDM  she presents to the er for evaluation of alcohol intoxication. At arrival, patient is complaining of upper abdominal pain. He reports that the pain began yesterday. No vomiting  or diarrhea. Patient is clearly intoxicated. His abdominal exam reveals mild tenderness in the epigastric area, but no signs of peritonitis. No Murphy sign. Blood work has partially returned, LFTs normal. Lipase pending. Have ordered CAT scan to further evaluate. Differential diagnosis includes alcoholic gastritis and pancreatitis. Gallbladder disease felt to be unlikely. No evidence of lower abdominal pathology. Case signed out to Doctor Ranae Palms to followup CAT scan and disposition patient based on the results. Anticipate discharge if CT is normal.       Gilda Crease, MD 07/07/12 [phone]

## 2012-07-10 ENCOUNTER — Encounter (HOSPITAL_COMMUNITY): Payer: Self-pay | Admitting: Emergency Medicine

## 2012-07-10 ENCOUNTER — Emergency Department (HOSPITAL_COMMUNITY)
Admission: EM | Admit: 2012-07-10 | Discharge: 2012-07-11 | Disposition: A | Discharge status: Home / Self Care | Payer: Self-pay | Attending: Emergency Medicine | Admitting: Emergency Medicine

## 2012-07-10 DIAGNOSIS — J45909 Unspecified asthma, uncomplicated: Secondary | ICD-10-CM | POA: Insufficient documentation

## 2012-07-10 DIAGNOSIS — F17200 Nicotine dependence, unspecified, uncomplicated: Secondary | ICD-10-CM | POA: Insufficient documentation

## 2012-07-10 DIAGNOSIS — F10220 Alcohol dependence with intoxication, uncomplicated: Secondary | ICD-10-CM

## 2012-07-10 DIAGNOSIS — F10229 Alcohol dependence with intoxication, unspecified: Secondary | ICD-10-CM | POA: Insufficient documentation

## 2012-07-10 LAB — CBC
HCT: 45.5 % (ref 39.0–52.0)
Hemoglobin: 16.2 g/dL (ref 13.0–17.0)
MCH: 31.7 pg (ref 26.0–34.0)
MCV: 89 fL (ref 78.0–100.0)
Platelets: 195 10*3/uL (ref 150–400)
RBC: 5.11 MIL/uL (ref 4.22–5.81)
WBC: 4.9 10*3/uL (ref 4.0–10.5)

## 2012-07-10 LAB — COMPREHENSIVE METABOLIC PANEL
AST: 27 U/L (ref 0–37)
BUN: 9 mg/dL (ref 6–23)
CO2: 21 mEq/L (ref 19–32)
Calcium: 8.7 mg/dL (ref 8.4–10.5)
Chloride: 100 mEq/L (ref 96–112)
Creatinine, Ser: 0.76 mg/dL (ref 0.50–1.35)
GFR calc Af Amer: >90 mL/min (ref >90)
GFR calc non Af Amer: >90 mL/min (ref >90)
Glucose, Bld: 124 mg/dL — ABNORMAL HIGH (ref 70–99)
Total Bilirubin: 0.3 mg/dL (ref 0.3–1.2)

## 2012-07-10 MED ORDER — SODIUM CHLORIDE 0.9 % IV BOLUS (SEPSIS)
1000.0000 mL | Freq: Once | INTRAVENOUS | Status: AC
  Administered 2012-07-10: 1000 mL via INTRAVENOUS

## 2012-07-10 NOTE — ED Notes (Signed)
Per EMS: complaining of left lower abd pain. 12 beers said it hurt after drinking the beers. Pt was behind [employer] sitting on bench and unknown person called EMS.

## 2012-07-10 NOTE — ED Notes (Signed)
Bed:WA17<BR> Expected date:<BR> Expected time:<BR> Means of arrival:<BR> Comments:<BR> EMS

## 2012-07-10 NOTE — ED Provider Notes (Signed)
History     CSN: [id number]  Arrival date & time 07/10/12  2152   First MD Initiated Contact with Patient 07/10/12 2156      Chief Complaint  Patient presents with  . Alcohol Intoxication    (Consider location/radiation/quality/duration/timing/severity/associated sxs/prior treatment) HPI Patient presents stating that he hurts in his back.  The pain seems to be diffuse, nonradiating. The patient is clinically intoxicated. The patient was found prior to arrival sitting behind [employer], drinking beer. Per report the patient had significant amounts of alcohol. The patient denies trauma, fights, falls.  He also denies any head pain, neck pain. On exam he is sleeping, but awakens easily, answers questions briefly but appropriately. Past Medical History  Diagnosis Date  . ETOH abuse   . Asthma     History reviewed. No pertinent past surgical history.  History reviewed. No pertinent family history.  History  Substance Use Topics  . Smoking status: Current Every Day Smoker  . Smokeless tobacco: Never Used  . Alcohol Use: Yes     Comment: drinks daily      Review of Systems  Unable to perform ROS: Other    Allergies  Review of patient's allergies indicates no known allergies.  Home Medications  No current outpatient prescriptions on file.  BP 102/69  Pulse 98  Temp(Src) 98.6 F (37 C) (Oral)  Resp 15  SpO2 95%  Physical Exam  Constitutional: He is oriented to person, place, and time. He appears well-developed and well-nourished. No distress.  HENT:  Head: Normocephalic and atraumatic.  Right Ear: Hearing normal.  Left Ear: Hearing normal.  Nose: Nose normal.  Mouth/Throat: Oropharynx is clear and moist and mucous membranes are normal.  Eyes: Conjunctivae and EOM are normal. Pupils are equal, round, and reactive to light.  Neck: Normal range of motion. Neck supple.  Cardiovascular: Regular rhythm, S1 normal and S2 normal.  Exam reveals gallop.   No murmur  heard. Pulmonary/Chest: Effort normal and breath sounds normal. No respiratory distress.  Abdominal: Soft. Normal appearance and bowel sounds are normal. There is no hepatosplenomegaly. There is tenderness in the epigastric area. There is no rebound, no guarding, no tenderness at McBurney's point and negative Murphy's sign. No hernia.  Musculoskeletal: Normal range of motion.  Neurological: He is alert and oriented to person, place, and time. He displays no atrophy and no tremor. No cranial nerve deficit or sensory deficit. He exhibits normal muscle tone. He displays no seizure activity. Coordination normal. GCS eye subscore is 4. GCS verbal subscore is 5. GCS motor subscore is 6.  Patient moves all extremities spontaneously, minimally.  Pupils are equal round reactive, with appropriate extraocular eye motion.  Skin: Skin is warm, dry and intact. No rash noted. No cyanosis.  Psychiatric: His speech is delayed and slurred. He is slowed and withdrawn. Cognition and memory are impaired. He exhibits a depressed mood.    ED Course  Procedures (including critical care time)  Labs Reviewed - No data to display No results found.   No diagnosis found.  After the initial exam I examined in the medical chart, including prior recent presentations for similar concerns.   11:20 PM Labs notable for blood alcohol level greater than 300 MDM  This gentleman presents with acute alcohol intoxication.  There's mild epigastric discomfort, but no overt signs of trauma, and given that the patient awakens easily with minor stimuli, there is low suspicion for occult trauma or intracranial hemorrhage.  The patient is intoxicated and will  require substantial time to achieve sobriety. He was signed out to the following care provider for this observation period.       Gerhard Munch, MD 07/10/12 2322

## 2012-07-11 NOTE — ED Notes (Signed)
Pt ambulated with minimal assistance,  Slightly unsteady on feet,  Pt states he is not dizzy that he is just sleepy

## 2012-07-11 NOTE — ED Notes (Signed)
Pt ambulated around unit with RN and returned to room 17. Pt tolerated well.

## 2012-07-11 NOTE — ED Provider Notes (Signed)
Pt is awake, oriented, ambulating.  Will d/c home  Rolan Bucco, MD 07/11/12 [phone]

## 2012-07-29 ENCOUNTER — Emergency Department (HOSPITAL_COMMUNITY): Payer: Self-pay

## 2012-07-29 ENCOUNTER — Encounter (HOSPITAL_COMMUNITY): Payer: Self-pay

## 2012-07-29 ENCOUNTER — Emergency Department (HOSPITAL_COMMUNITY)
Admission: EM | Admit: 2012-07-29 | Discharge: 2012-07-30 | Disposition: A | Discharge status: Home / Self Care | Payer: Self-pay | Attending: Emergency Medicine | Admitting: Emergency Medicine

## 2012-07-29 DIAGNOSIS — J45909 Unspecified asthma, uncomplicated: Secondary | ICD-10-CM | POA: Insufficient documentation

## 2012-07-29 DIAGNOSIS — F1010 Alcohol abuse, uncomplicated: Secondary | ICD-10-CM | POA: Insufficient documentation

## 2012-07-29 DIAGNOSIS — S139XXA Sprain of joints and ligaments of unspecified parts of neck, initial encounter: Secondary | ICD-10-CM | POA: Insufficient documentation

## 2012-07-29 DIAGNOSIS — F17200 Nicotine dependence, unspecified, uncomplicated: Secondary | ICD-10-CM | POA: Insufficient documentation

## 2012-07-29 DIAGNOSIS — S0990XA Unspecified injury of head, initial encounter: Secondary | ICD-10-CM | POA: Insufficient documentation

## 2012-07-29 DIAGNOSIS — S161XXA Strain of muscle, fascia and tendon at neck level, initial encounter: Secondary | ICD-10-CM

## 2012-07-29 MED ORDER — ETODOLAC 200 MG PO CAPS: 200.0000 mg | ORAL_CAPSULE | Freq: Three times a day (TID) | ORAL | Status: DC

## 2012-07-29 NOTE — ED Notes (Signed)
Patient in CT at this time.

## 2012-07-29 NOTE — ED Notes (Signed)
Pt sleeping will arouse to loud stimuli.  Admits to ETOH.
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
## 2012-07-29 NOTE — ED Notes (Signed)
MD at bedside. 

## 2012-07-29 NOTE — ED Notes (Signed)
Patient returned from CT at this time. Placed back on monitor.

## 2012-07-30 NOTE — ED Notes (Signed)
Pt resting on bed.  Pt able to be aroused with light shaking

## 2012-09-18 ENCOUNTER — Emergency Department (HOSPITAL_COMMUNITY): Payer: Self-pay

## 2012-09-18 ENCOUNTER — Observation Stay (HOSPITAL_COMMUNITY)
Admission: EM | Admit: 2012-09-18 | Discharge: 2012-09-19 | Disposition: A | Discharge status: Home / Self Care | Payer: MEDICAID | Attending: Internal Medicine | Admitting: Internal Medicine

## 2012-09-18 ENCOUNTER — Encounter (HOSPITAL_COMMUNITY): Payer: Self-pay | Admitting: Internal Medicine

## 2012-09-18 DIAGNOSIS — Z720 Tobacco use: Secondary | ICD-10-CM

## 2012-09-18 DIAGNOSIS — F17200 Nicotine dependence, unspecified, uncomplicated: Secondary | ICD-10-CM | POA: Insufficient documentation

## 2012-09-18 DIAGNOSIS — J45909 Unspecified asthma, uncomplicated: Secondary | ICD-10-CM

## 2012-09-18 DIAGNOSIS — F1010 Alcohol abuse, uncomplicated: Secondary | ICD-10-CM

## 2012-09-18 DIAGNOSIS — J45901 Unspecified asthma with (acute) exacerbation: Principal | ICD-10-CM

## 2012-09-18 DIAGNOSIS — H9209 Otalgia, unspecified ear: Secondary | ICD-10-CM | POA: Insufficient documentation

## 2012-09-18 HISTORY — DX: Headache: R51

## 2012-09-18 HISTORY — DX: Shortness of breath: R06.02

## 2012-09-18 HISTORY — DX: Sleep apnea, unspecified: G47.30

## 2012-09-18 HISTORY — DX: Cardiomegaly: I51.7

## 2012-09-18 HISTORY — DX: Chest pain, unspecified: R07.9

## 2012-09-18 HISTORY — DX: Tobacco use: Z72.0

## 2012-09-18 LAB — BASIC METABOLIC PANEL
Calcium: 8.6 mg/dL (ref 8.4–10.5)
Calcium: 8.6 mg/dL (ref 8.4–10.5)
Chloride: 109 mEq/L (ref 96–112)
Creatinine, Ser: 0.82 mg/dL (ref 0.50–1.35)
GFR calc Af Amer: >90 mL/min (ref >90)
GFR calc Af Amer: >90 mL/min (ref >90)
GFR calc non Af Amer: >90 mL/min (ref >90)
GFR calc non Af Amer: >90 mL/min (ref >90)
Glucose, Bld: 133 mg/dL — ABNORMAL HIGH (ref 70–99)
Sodium: 146 mEq/L — ABNORMAL HIGH (ref 135–145)

## 2012-09-18 LAB — POCT I-STAT TROPONIN I: Troponin i, poc: 0 ng/mL (ref 0.00–0.08)

## 2012-09-18 LAB — CBC
MCH: 31.3 pg (ref 26.0–34.0)
MCHC: 34.8 g/dL (ref 30.0–36.0)
Platelets: 163 10*3/uL (ref 150–400)
RBC: 5.05 MIL/uL (ref 4.22–5.81)

## 2012-09-18 LAB — OSMOLALITY: Osmolality: 319 mOsm/kg — ABNORMAL HIGH (ref 275–300)

## 2012-09-18 LAB — ETHANOL: Alcohol, Ethyl (B): 56 mg/dL — ABNORMAL HIGH (ref 0–11)

## 2012-09-18 LAB — RAPID URINE DRUG SCREEN, HOSP PERFORMED
Barbiturates: NOT DETECTED
Opiates: NOT DETECTED
Tetrahydrocannabinol: NOT DETECTED

## 2012-09-18 LAB — URINALYSIS, ROUTINE W REFLEX MICROSCOPIC
Hgb urine dipstick: NEGATIVE
Ketones, ur: 15 mg/dL — AB
Protein, ur: NEGATIVE mg/dL
Urobilinogen, UA: 0.2 mg/dL (ref 0.0–1.0)

## 2012-09-18 LAB — LACTIC ACID, PLASMA: Lactic Acid, Venous: 3.4 mmol/L — ABNORMAL HIGH (ref 0.5–2.2)

## 2012-09-18 MED ORDER — LEVALBUTEROL HCL 1.25 MG/0.5ML IN NEBU
1.2500 mg | INHALATION_SOLUTION | RESPIRATORY_TRACT | Status: DC | PRN
  Filled 2012-09-18: qty 0.5

## 2012-09-18 MED ORDER — ACETAMINOPHEN 325 MG PO TABS: 650.0000 mg | ORAL_TABLET | Freq: Four times a day (QID) | ORAL | Status: DC | PRN

## 2012-09-18 MED ORDER — ENOXAPARIN SODIUM 40 MG/0.4ML ~~LOC~~ SOLN
40.0000 mg | SUBCUTANEOUS | Status: DC
  Administered 2012-09-18: 40 mg via SUBCUTANEOUS
  Filled 2012-09-18 (×2): qty 0.4

## 2012-09-18 MED ORDER — DEXTROSE-NACL 5-0.9 % IV SOLN
INTRAVENOUS | Status: DC
  Administered 2012-09-18: 100 mL/h via INTRAVENOUS

## 2012-09-18 MED ORDER — ADULT MULTIVITAMIN W/MINERALS CH
1.0000 | ORAL_TABLET | Freq: Every day | ORAL | Status: DC
  Administered 2012-09-18: 1 via ORAL
  Filled 2012-09-18 (×2): qty 1

## 2012-09-18 MED ORDER — ONDANSETRON HCL 4 MG PO TABS: 4.0000 mg | ORAL_TABLET | Freq: Four times a day (QID) | ORAL | Status: DC | PRN

## 2012-09-18 MED ORDER — ACETAMINOPHEN 650 MG RE SUPP: 650.0000 mg | Freq: Four times a day (QID) | RECTAL | Status: DC | PRN

## 2012-09-18 MED ORDER — ALBUTEROL SULFATE (5 MG/ML) 0.5% IN NEBU
INHALATION_SOLUTION | RESPIRATORY_TRACT | Status: AC
  Filled 2012-09-18: qty 0.5

## 2012-09-18 MED ORDER — LORAZEPAM 2 MG/ML IJ SOLN: 1.0000 mg | Freq: Four times a day (QID) | INTRAMUSCULAR | Status: DC | PRN

## 2012-09-18 MED ORDER — DEXTROSE-NACL 5-0.9 % IV SOLN
INTRAVENOUS | Status: AC
  Administered 2012-09-18: 12:00:00 via INTRAVENOUS

## 2012-09-18 MED ORDER — VITAMIN B-1 100 MG PO TABS
100.0000 mg | ORAL_TABLET | Freq: Every day | ORAL | Status: DC
  Administered 2012-09-18: 100 mg via ORAL
  Filled 2012-09-18 (×2): qty 1

## 2012-09-18 MED ORDER — ALBUTEROL (5 MG/ML) CONTINUOUS INHALATION SOLN
INHALATION_SOLUTION | RESPIRATORY_TRACT | Status: AC
  Filled 2012-09-18: qty 20

## 2012-09-18 MED ORDER — PREDNISONE 20 MG PO TABS
40.0000 mg | ORAL_TABLET | Freq: Every day | ORAL | Status: DC
  Administered 2012-09-19: 40 mg via ORAL
  Filled 2012-09-18 (×3): qty 2

## 2012-09-18 MED ORDER — ONDANSETRON HCL 4 MG/2ML IJ SOLN: 4.0000 mg | Freq: Four times a day (QID) | INTRAMUSCULAR | Status: DC | PRN

## 2012-09-18 MED ORDER — FOLIC ACID 1 MG PO TABS
1.0000 mg | ORAL_TABLET | Freq: Every day | ORAL | Status: DC
  Administered 2012-09-18: 1 mg via ORAL
  Filled 2012-09-18 (×2): qty 1

## 2012-09-18 MED ORDER — IPRATROPIUM BROMIDE 0.02 % IN SOLN
0.5000 mg | Freq: Once | RESPIRATORY_TRACT | Status: AC
  Administered 2012-09-18: 0.5 mg via RESPIRATORY_TRACT

## 2012-09-18 MED ORDER — ALBUTEROL (5 MG/ML) CONTINUOUS INHALATION SOLN: 10.0000 mg/h | INHALATION_SOLUTION | Freq: Once | RESPIRATORY_TRACT | Status: DC

## 2012-09-18 MED ORDER — ALBUTEROL (5 MG/ML) CONTINUOUS INHALATION SOLN
10.0000 mg/h | INHALATION_SOLUTION | Freq: Once | RESPIRATORY_TRACT | Status: AC
  Administered 2012-09-18: 10 mg/h via RESPIRATORY_TRACT

## 2012-09-18 MED ORDER — HYDROCODONE-ACETAMINOPHEN 5-325 MG PO TABS
1.0000 | ORAL_TABLET | ORAL | Status: DC | PRN
  Administered 2012-09-18: 1 via ORAL
  Filled 2012-09-18: qty 1

## 2012-09-18 MED ORDER — IPRATROPIUM BROMIDE 0.02 % IN SOLN
RESPIRATORY_TRACT | Status: AC
  Filled 2012-09-18: qty 2.5

## 2012-09-18 MED ORDER — LEVALBUTEROL HCL 1.25 MG/0.5ML IN NEBU
1.2500 mg | INHALATION_SOLUTION | RESPIRATORY_TRACT | Status: DC
  Administered 2012-09-18 – 2012-09-19 (×5): 1.25 mg via RESPIRATORY_TRACT
  Filled 2012-09-18 (×15): qty 0.5

## 2012-09-18 MED ORDER — MAGNESIUM SULFATE 40 MG/ML IJ SOLN
2.0000 g | Freq: Once | INTRAMUSCULAR | Status: AC
  Administered 2012-09-18: 2 g via INTRAVENOUS
  Filled 2012-09-18: qty 50

## 2012-09-18 MED ORDER — THIAMINE HCL 100 MG/ML IJ SOLN: 100.0000 mg | Freq: Every day | INTRAMUSCULAR | Status: DC

## 2012-09-18 MED ORDER — LORAZEPAM 1 MG PO TABS: 1.0000 mg | ORAL_TABLET | Freq: Four times a day (QID) | ORAL | Status: DC | PRN

## 2012-09-18 MED ORDER — NICOTINE 14 MG/24HR TD PT24
14.0000 mg | MEDICATED_PATCH | Freq: Every day | TRANSDERMAL | Status: DC | PRN
  Filled 2012-09-18: qty 1

## 2012-09-18 NOTE — ED Notes (Signed)
JEFF IN RESP. AWARE PT HAS TX ORDERED

## 2012-09-18 NOTE — ED Notes (Signed)
Admitting MD at the bedside.  

## 2012-09-18 NOTE — H&P (Signed)
Date: 09/18/2012               Patient Name:  Jacob Gaines MRN: [id number]  DOB: 1965-05-13 Age / Sex: 47 y.o., male   PCP: No Pcp Per Patient         Medical Service: Internal Medicine Teaching Service         Attending Physician: Dr. Judyann Munson    First Contact: Dr. Yetta Barre Pager: [phone]  Second Contact: Dr. Clyde Lundborg Pager: [phone]       After Hours (After 5p/  First Contact Pager: [phone]  weekends / holidays): Second Contact Pager: [phone]   Chief Complaint: Shortness of breath  History of Present Illness:  The patient is a 47 yo man, history of asthma, presenting for shortness of breath.  The patient was in his usual state of health last night, but awoke this morning from sleep with significant dyspnea, as well as chest "tightness", headache, and a non-productive cough.  The patient notes no fevers, chills, nausea, vomiting.  The patient states his symptoms feel similar to his prior asthma exacerbations, and since he had run out of his albuterol inhaler 1 month ago, the patient sought care in the ED.  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  He recently started smoking cigarettes 4 months ago, and notes smoking 5 cigs/day.  Last night was his birthday, and he notes drinking 12 cans of beer, and smoking, though denies drugs.    The patient has a history of asthma, and has been on steroids in the past, but has never been intubated.  At baseline, he notes using his albuterol inhaler only twice/month.  He states he has been on spiriva in the past, but ran out of this medication 1 month ago, at the same time that he ran out of albuterol.  Meds: The patient takes no home medications  Allergies: Allergies as of 09/18/2012  . (No Known Allergies)   Past Medical History  Diagnosis Date  . ETOH abuse   . Asthma   . Tobacco abuse    No past surgical history on file. Family  History  Problem Relation Age of Onset  . Heart attack Paternal Grandfather 54  . Colon cancer Paternal Grandmother    History   Social History  . Marital Status: Single    Spouse Name: N/A    Number of Children: N/A  . Years of Education: N/A   Occupational History  . Not on file.   Social History Main Topics  . Smoking status: Current Every Day Smoker -- 0.25 packs/day for .3 years  . Smokeless tobacco: Never Used  . Alcohol Use: 7.0 oz/week    14 drink(s) per week     Comment: Drinks a 40-oz beer a few days/week  . Drug Use: No  . Sexual Activity: Not on file   Other Topics Concern  . Not on file   Social History Narrative   Works on a Veterinary surgeon.  Lives with a friend.  Does not have a PCP.    Review of Systems: General: no fevers, chills, changes in weight, changes in appetite Skin: no rash HEENT: no blurry vision, hearing changes, sore throat Pulm: see HPI CV: +occasional self-limited palpitations Abd: no abdominal pain, nausea/vomiting, diarrhea/constipation GU: no dysuria, hematuria, polyuria Ext: no arthralgias, myalgias Neuro: no weakness, numbness, or tingling  Physical Exam: Blood pressure 132/96, pulse 121,  resp. rate 23, SpO2 100.00%. General: sitting in bed, appears only mildly uncomfortable HEENT: pupils equal round and reactive to light, vision grossly intact, oropharynx clear and non-erythematous  Neck: supple, no lymphadenopathy Lungs: increased work of respiration, minimal wheezes, no rales, or ronchi Heart: tachycardic, regular rhythm, no murmurs, gallops, or rubs Abdomen: soft, non-tender, non-distended, normal bowel sounds Extremities: no cyanosis, clubbing, or edema. Small 0.5 cm superficial skin tear on right dorsal foot (pt reports from wearing boots) Neurologic: alert & oriented X3, cranial nerves II-XII intact, strength grossly intact, sensation intact to light touch  Lab results: Basic Metabolic Panel:  Recent Labs   09/18/12 0818  NA 146*  K 3.5  CL 110  CO2 21  GLUCOSE 133*  BUN 11  CREATININE 0.96  CALCIUM 8.6   CBC:  Recent Labs  09/18/12 0818  WBC 6.3  HGB 15.8  HCT 45.4  MCV 89.9  PLT 163   Imaging results:  Dg Chest Portable 1 View  09/18/2012   *RADIOLOGY REPORT*  Clinical Data: Respiratory distress and asthma.  PORTABLE CHEST - 1 VIEW  Comparison: 02/23/2012  Findings: Single view of the chest was obtained.  Mild elevation of the right hemidiaphragm appears unchanged.  Stable appearance of the heart and mediastinum.  The trachea is midline.  No focal lung disease and no evidence for pulmonary edema.  IMPRESSION: No acute cardiopulmonary disease.   Original Report Authenticated By: Richarda Overlie, M.D.   Other results: EKG: Sinus tachycardia, S1Q3 noted  Assessment & Plan by Problem: The patient is a 47 yo man, history of asthma, presenting with an asthma exacerbation.  # Asthma Exacerbation - The patient presents with asthma exacerbation, likely triggered by cigarette smoking and exposure to fumes at work, with no rescue inhaler at home.  Symptoms have significantly improved after solumedrol 125 mg by EMS, nebulizer treatments, and Magnesium. -start Levalbuterol nebs q4hrs, as well as q2hrs prn (due to tachycardia) -pt received solumedrol on 8/15; will transition to prednisone 40 daily on 8/16 -encourage smoking cessation -will prescribe albuterol inhaler at discharge  # AG acidosis - The patient presents with an AG = 15.  The etiology of his current anion gap is likely alcoholic ketoacidosis (pt admits to heavy alcohol use on the day PTA) vs lactic acidosis (due to increased respiratory rate) vs normal variant (per chart review, pt has had similar anion gap on his last 5 BMETs, dating back to 12/2011. During some of these encounters, the patient was intoxicated).  No history of salicylate use. -check UA for ketones -check lactic acid, osmolar gap (calculated Sosm = 303.3), ethanol  level, repeat BMET -will give D5NS at 100 cc/hr x5 hrs, and regular diet, to treat alcoholic ketoacidosis  # Sinus tachycardia - noted on EKG, likely due to a combination of albuterol usage, asthma exacerbation, and anxiety.  Pt notes occasional palpitations.  S1Q3 noted on EKG likely represents transient R heart strain from current asthma exacerbation. -will observe on telemetry overnight  # Alcohol use - pt has had several prior encounters for intoxication.  Notes drinking 12 beers on the night prior to admission. -CIWA protocol  # Tobacco abuse - patient recently started smoking 4 months ago -nicotine patch prn -encouraged cessation  # Prophy - lovenox  Dispo: Disposition is deferred at this time, awaiting improvement of current medical problems. Anticipated discharge in approximately 1 day(s).   Signed: Linward Headland, MD 09/18/2012, 10:08 AM

## 2012-09-18 NOTE — ED Notes (Signed)
Patient has arrived on pod c to await a bed upstairs

## 2012-09-18 NOTE — ED Provider Notes (Signed)
CSN: [id number]     Arrival date & time 09/18/12  4098 History     First MD Initiated Contact with Patient 09/18/12 0800     Chief Complaint  Patient presents with  . Asthma   (Consider location/radiation/quality/duration/timing/severity/associated sxs/prior Treatment) HPI Comments: 47 year old male presents to emergency department via EMS for shortness of breath. Patient states he is out of asthma attack. 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 He denies recent fever, chills, asthma exacerbation, chest pain, nausea vomiting diarrhea abdominal pain, or other symptoms. Patient has a history of asthma however he does not take any medications for asthma. He denies recent steroid use, he has never been intubated for his asthma.  Patient is a 47 y.o. male presenting with asthma. The history is provided by the patient, the EMS personnel and a relative.  Asthma This is a new problem. The current episode started less than 1 hour ago. The problem occurs constantly. The problem has been gradually worsening. Associated symptoms include chest pain and shortness of breath. Pertinent negatives include no abdominal pain and no headaches. Nothing aggravates the symptoms. Nothing relieves the symptoms. Treatments tried: EMS provided albuterol, Atrovent, Solu-Medrol. The treatment provided moderate relief.   Pt recently started smoking again.    Past Medical History  Diagnosis Date  . ETOH abuse   . Asthma    No past surgical history on file. No family history on file. History  Substance Use Topics  . Smoking status: Current Every Day Smoker  . Smokeless tobacco: Never Used  . Alcohol Use: Yes     Comment: drinks daily    Review of Systems  Constitutional: Positive for activity change. Negative for chills, diaphoresis, appetite change and fatigue.  HENT: Negative.   Eyes: Negative.   Respiratory: Positive for cough, chest tightness, shortness of  breath and wheezing. Negative for choking.   Cardiovascular: Positive for chest pain.  Gastrointestinal: Negative.  Negative for nausea, abdominal pain, diarrhea and abdominal distention.  Endocrine: Negative.   Genitourinary: Negative.   Musculoskeletal: Negative.   Neurological: Negative for headaches.    Allergies  Review of patient's allergies indicates no known allergies.  Home Medications   No current outpatient prescriptions on file. BP 132/96  Pulse 121  Resp 23  SpO2 100% Physical Exam  Constitutional: He is oriented to person, place, and time. He appears well-developed and well-nourished. He appears distressed. He is not intubated.  47 year old male in moderate distress, able to speak in 4-5 word responses without clear signs of distress, alert, follows commands  HENT:  Head: Normocephalic and atraumatic.  Eyes:  Conjunctiva hyperemic, no discharge, extraocular muscles intact  Neck: Normal range of motion. Neck supple.  Cardiovascular: Regular rhythm.  Exam reveals no friction rub.   No murmur heard. Tachycardic  Pulmonary/Chest: No accessory muscle usage. Tachypnea noted. No apnea. He is not intubated. He is in respiratory distress. He has wheezes. He has no rales. He exhibits no tenderness.  Abdominal: Soft. Bowel sounds are normal. He exhibits no distension. There is no tenderness. There is no rebound.  Musculoskeletal: Normal range of motion.  Neurological: He is alert and oriented to person, place, and time. He has normal reflexes.    ED Course   Procedures (including critical care time)  Labs Reviewed  BASIC METABOLIC PANEL - Abnormal; Notable for the following:    Sodium 146 (*)    Glucose, Bld 133 (*)    All other  components within normal limits  CBC  POCT I-STAT TROPONIN I   Dg Chest Portable 1 View  09/18/2012   *RADIOLOGY REPORT*  Clinical Data: Respiratory distress and asthma.  PORTABLE CHEST - 1 VIEW  Comparison: 02/23/2012  Findings: Single  view of the chest was obtained.  Mild elevation of the right hemidiaphragm appears unchanged.  Stable appearance of the heart and mediastinum.  The trachea is midline.  No focal lung disease and no evidence for pulmonary edema.  IMPRESSION: No acute cardiopulmonary disease.   Original Report Authenticated By: Richarda Overlie, M.D.   1. Asthma attack      Date: 09/18/2012  Rate: 116  Rhythm: sinus tachycardia  QRS Axis: normal  Intervals: normal  ST/T Wave abnormalities: normal  Conduction Disutrbances:none  Narrative Interpretation:   Old EKG Reviewed: unchanged  Results for orders placed during the hospital encounter of 09/18/12  BASIC METABOLIC PANEL      Result Value Range   Sodium 146 (*) 135 - 145 mEq/L   Potassium 3.5  3.5 - 5.1 mEq/L   Chloride 110  96 - 112 mEq/L   CO2 21  19 - 32 mEq/L   Glucose, Bld 133 (*) 70 - 99 mg/dL   BUN 11  6 - 23 mg/dL   Creatinine, Ser 3.08  0.50 - 1.35 mg/dL   Calcium 8.6  8.4 - 65.7 mg/dL   GFR calc non Af Amer >90  >90 mL/min   GFR calc Af Amer >90  >90 mL/min  CBC      Result Value Range   WBC 6.3  4.0 - 10.5 K/uL   RBC 5.05  4.22 - 5.81 MIL/uL   Hemoglobin 15.8  13.0 - 17.0 g/dL   HCT 84.6  96.2 - 95.2 %   MCV 89.9  78.0 - 100.0 fL   MCH 31.3  26.0 - 34.0 pg   MCHC 34.8  30.0 - 36.0 g/dL   RDW 84.1  32.4 - 40.1 %   Platelets 163  150 - 400 K/uL  POCT I-STAT TROPONIN I      Result Value Range   Troponin i, poc 0.00  0.00 - 0.08 ng/mL   Comment 3               MDM  47 year old male presents emergency department with acute shortness of breath consistent with asthma exacerbation.  EMS initiated IV, provided albuterol Atrovent, and Solu-Medrol. On arrival patient pulse ox is 100%, he is alert and oriented, and follows commands. Symptoms have improved significantly since EMS initiated treatment the patient reports subjective improvement. Plan to continue with treatment by giving continuous albuterol Atrovent nebulizer. EKG was tachycardic  rhythm at 116 with no signs of ischemia. We'll obtain blood work to include troponin.  9:39 AM Vital signs stable, symptoms improved, patient is currently on continuous nebulizer. Patient will try her inpatient stay for continued treatment and management. Chest x-ray negative, EKG negative for ischemic changes, troponin negative. We'll initiate magnesium infusion of 2 g over 2 hours and consult internal medicine for admission.  Darlys Gales, MD 09/18/12 [phone]

## 2012-09-18 NOTE — ED Notes (Addendum)
Report given to Drinda Butts, RN, Pt. Stable upon transfer to RM 29.

## 2012-09-18 NOTE — ED Notes (Signed)
REPORT CALLED TO Fayrene Fearing

## 2012-09-18 NOTE — ED Notes (Signed)
[place]  [place]od position. Wheezing in all fields.  Pt. Received solumedrol 125mg  IV, and Albuterol 10mg  Iv and 0.5mg  ATrovent treatment .  Pt. Arrived with a treatment in progress,  Having chest pain en route and pain in his lungs. Pt. Has  Hx of asthma.  Alert and oriented X4.

## 2012-09-18 NOTE — ED Notes (Signed)
Lunch ordered for patient, called to crystal

## 2012-09-19 DIAGNOSIS — J45901 Unspecified asthma with (acute) exacerbation: Secondary | ICD-10-CM

## 2012-09-19 DIAGNOSIS — F17200 Nicotine dependence, unspecified, uncomplicated: Secondary | ICD-10-CM

## 2012-09-19 DIAGNOSIS — F1010 Alcohol abuse, uncomplicated: Secondary | ICD-10-CM

## 2012-09-19 LAB — BASIC METABOLIC PANEL
CO2: 25 mEq/L (ref 19–32)
Calcium: 8.8 mg/dL (ref 8.4–10.5)
Chloride: 106 mEq/L (ref 96–112)
Creatinine, Ser: 0.76 mg/dL (ref 0.50–1.35)
GFR calc Af Amer: >90 mL/min (ref >90)
Sodium: 140 mEq/L (ref 135–145)

## 2012-09-19 LAB — HEMOGLOBIN A1C
Hgb A1c MFr Bld: 5.7 % — ABNORMAL HIGH (ref <5.7)
Mean Plasma Glucose: 117 mg/dL — ABNORMAL HIGH (ref <117)

## 2012-09-19 MED ORDER — PREDNISONE 20 MG PO TABS: 40.0000 mg | ORAL_TABLET | Freq: Every day | ORAL | Status: DC

## 2012-09-19 MED ORDER — FLUTICASONE-SALMETEROL 250-50 MCG/DOSE IN AEPB: 2.0000 | INHALATION_SPRAY | Freq: Two times a day (BID) | RESPIRATORY_TRACT | Status: DC

## 2012-09-19 MED ORDER — ALBUTEROL SULFATE HFA 108 (90 BASE) MCG/ACT IN AERS: 2.0000 | INHALATION_SPRAY | Freq: Four times a day (QID) | RESPIRATORY_TRACT | Status: DC | PRN

## 2012-09-19 MED ORDER — MOMETASONE FURO-FORMOTEROL FUM 100-5 MCG/ACT IN AERO
2.0000 | INHALATION_SPRAY | Freq: Two times a day (BID) | RESPIRATORY_TRACT | Status: DC
  Filled 2012-09-19: qty 8.8

## 2012-09-19 MED ORDER — SODIUM CHLORIDE 0.9 % IV SOLN: INTRAVENOUS | Status: DC

## 2012-09-19 NOTE — H&P (Signed)
IM ATTENDING H&P  Date: 09/19/2012  Patient name: Jacob Gaines  Medical record number: [id number]  Date of birth: 1965/08/05   This patient has been seen and the plan of care was discussed with the house staff. Please see their note for complete details. I concur with their findings with the following additions/corrections:  Jacob Gaines is a 47yo man with PMH of asthma who presented complaining of SOB, wheezing and chest tightness.  He reports a long history of asthma which was relatively well controlled with albuterol and spiriva previously.  However, he ran out of these medications about a month ago.  Recently he has been working on a road crew around fumes and he has started smoking about 5 cig/day.  He reports that prior to his admission, he was in his usual state of health but on the morning of admission he acutely became SOB.  He reports no fever, chills, nausea, vomiting, chest pressure.  He notes the symptoms are similar to his exacerbations of asthma in the past.  Jacob Gaines received steroids and breathing treatments in the ED and felt much improved.  As for control of his asthma, he reported using his inhaler about 2 times per month, however, also having night awakening from SOB about 4/month. He also complains of about 3 months of worsening hearing loss on the right.  He reports that 3 months ago he was swimming for about 6 hours and afterwards noticed pain in his ear.  He had water in his ear which cleared, but since then has had intermittent pain and hearing loss.    His current acute issues are - -   1. Asthma exacerbation - much improved to baseline.  Short course of steroids (5 days) at home.  Albuterol inhaler.  He might benefit from an ICS, however, I am not sure if he could afford this.  [facility] follow up and establish Primary Care.   2. AG acidosis, hyperglycemia - resolved, likely due to ETOH ketoacidosis given his ETOH consumption on Thursday evening.  Checking an A1C.   3.  Sinus tachycardia - resolved, likely due to albuterol and acute illness.   4. Ear pain - unclear etiology.  No otoscope available during my exam, will ask resident team to evaluate further prior to discharge, can f/u in [facility] for chronic issue.   Other issues per resident note.   Inez Catalina, MD 09/19/2012, 8:26 AM
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
## 2012-09-19 NOTE — Progress Notes (Signed)
I have seen the patient and reviewed the daily progress note by medical student,  Josh and discussed the care of the patient with them. Please see my note for documentation of my findings, assessment, and plans.   Kayveon Lennartz, MD PGY3, Internal Medicine Teaching Service Pager: [phone]   

## 2012-09-19 NOTE — Progress Notes (Signed)
Subjective:  - Patient feels much better. Her SOB resolved. No chest pain or SOB. - No fever or chills. - he feels ready to go home.  Objective: Vital signs in last 24 hours: Filed Vitals:   09/18/12 2101 09/19/12 0305 09/19/12 0427 09/19/12 0832  BP:   108/54   Pulse:   67   Temp:   98.5 F (36.9 C)   TempSrc:   Oral   Resp:   19   Height:      Weight:      SpO2: 98% 100% 98% 98%   Weight change:   Intake/Output Summary (Last 24 hours) at 09/19/12 1132 Last data filed at 09/18/12 1806  Gross per 24 hour  Intake    240 ml  Output      0 ml  Net    240 ml    Physical Exam:   Filed Vitals:   09/18/12 2101 09/19/12 0305 09/19/12 0427 09/19/12 0832  BP:   108/54   Pulse:   67   Temp:   98.5 F (36.9 C)   TempSrc:   Oral   Resp:   19   Height:      Weight:      SpO2: 98% 100% 98% 98%    General: Not in acute distress HEENT: PERRL, EOMI, no scleral icterus, No JVD or bruit.  Ear wax obstructs canal completely. Tympanic membrane cannot be evaluated. Cardiac: S1/S2, RRR, No murmurs, gallops or rubs Pulm: Good air movement bilaterally. minimal expiratory wheezes, no rales, or ronchi Abd: Soft, nondistended, nontender, no rebound pain, no organomegaly, BS present Ext: No edema. 2+DP/PT pulse bilaterally Musculoskeletal: No joint deformities, erythema, or stiffness, ROM full Skin: No rashes.  Neuro: Alert and oriented X3, cranial nerves II-XII grossly intact, muscle strength 5/5 in all extremeties, sensation to light touch intact.  Psych: Patient is not psychotic, no suicidal or hemocidal ideation.  Lab Results: Basic Metabolic Panel:  Recent Labs Lab 09/18/12 1033 09/19/12 0500  NA 144 140  K 3.4* 3.5  CL 109 106  CO2 20 25  GLUCOSE 213* 117*  BUN 12 9  CREATININE 0.82 0.76  CALCIUM 8.6 8.8   Liver Function Tests: No results found for this basename: AST, ALT, ALKPHOS, BILITOT, PROT, ALBUMIN,  in the last 168 hours No results found for this basename:  LIPASE, AMYLASE,  in the last 168 hours No results found for this basename: AMMONIA,  in the last 168 hours CBC:  Recent Labs Lab 09/18/12 0818  WBC 6.3  HGB 15.8  HCT 45.4  MCV 89.9  PLT 163   Cardiac Enzymes: No results found for this basename: CKTOTAL, CKMB, CKMBINDEX, TROPONINI,  in the last 168 hours BNP: No results found for this basename: PROBNP,  in the last 168 hours D-Dimer: No results found for this basename: DDIMER,  in the last 168 hours CBG: No results found for this basename: GLUCAP,  in the last 168 hours Hemoglobin A1C: No results found for this basename: HGBA1C,  in the last 168 hours Fasting Lipid Panel: No results found for this basename: CHOL, HDL, LDLCALC, TRIG, CHOLHDL, LDLDIRECT,  in the last 168 hours Thyroid Function Tests: No results found for this basename: TSH, T4TOTAL, FREET4, T3FREE, THYROIDAB,  in the last 168 hours Coagulation: No results found for this basename: LABPROT, INR,  in the last 168 hours Anemia Panel: No results found for this basename: VITAMINB12, FOLATE, FERRITIN, TIBC, IRON, RETICCTPCT,  in the last 168 hours Urine  Drug Screen: Drugs of Abuse     Component Value Date/Time   LABOPIA NONE DETECTED 09/18/2012 1131   COCAINSCRNUR NONE DETECTED 09/18/2012 1131   LABBENZ NONE DETECTED 09/18/2012 1131   AMPHETMU NONE DETECTED 09/18/2012 1131   THCU NONE DETECTED 09/18/2012 1131   LABBARB NONE DETECTED 09/18/2012 1131    Alcohol Level:  Recent Labs Lab 09/18/12 1033  ETH 56*   Urinalysis:  Recent Labs Lab 09/18/12 1131  COLORURINE YELLOW  LABSPEC 1.024  PHURINE 5.5  GLUCOSEU 500*  HGBUR NEGATIVE  BILIRUBINUR NEGATIVE  KETONESUR 15*  PROTEINUR NEGATIVE  UROBILINOGEN 0.2  NITRITE NEGATIVE  LEUKOCYTESUR NEGATIVE   Misc. Labs:   Micro Results: No results found for this or any previous visit (from the past 240 hour(s)). Studies/Results: Dg Chest Portable 1 View  09/18/2012   *RADIOLOGY REPORT*  Clinical Data:  Respiratory distress and asthma.  PORTABLE CHEST - 1 VIEW  Comparison: 02/23/2012  Findings: Single view of the chest was obtained.  Mild elevation of the right hemidiaphragm appears unchanged.  Stable appearance of the heart and mediastinum.  The trachea is midline.  No focal lung disease and no evidence for pulmonary edema.  IMPRESSION: No acute cardiopulmonary disease.   Original Report Authenticated By: Richarda Overlie, M.D.   Medications:  Scheduled Meds: . enoxaparin (LOVENOX) injection  40 mg Subcutaneous Q24H  . folic acid  1 mg Oral Daily  . levalbuterol  1.25 mg Nebulization Q4H  . multivitamin with minerals  1 tablet Oral Daily  . predniSONE  40 mg Oral Q breakfast  . thiamine  100 mg Oral Daily   Continuous Infusions:  PRN Meds:.acetaminophen, acetaminophen, HYDROcodone-acetaminophen, levalbuterol, LORazepam, LORazepam, nicotine, ondansetron (ZOFRAN) IV, ondansetron Assessment/Plan:  # Asthma Exacerbation - The patient presents with asthma exacerbation, likely triggered by cigarette smoking and exposure to fumes at work, with no rescue inhaler at home.  Symptoms have significantly improved after solumedrol 125 mg by EMS, nebulizer treatments, and Magnesium. Patient was treated with nebulizer and oral prednisone. He responded to the treatment well. His symptoms resolved. Patient is ready to be discharged. He will needs to continue albuterol inhaler prn and Advair daily since he has frequent coughing and wheezing in the night.   - will let pt complete 3 more days for prednisone 40 mg daily at discharge.  - d/c him on albuterol inhaler and Advair inhaler.  # AG acidosis - resolved. The patient presents with an AG = 15. It is most likely secondary to alcohol drinking. AG resolved with IVF (D5NS).   # Sinus tachycardia - noted on EKG, likely due to a combination of albuterol usage, asthma exacerbation, and anxiety.  Pt notes occasional palpitations.  S1Q3 noted on EKG likely represents  transient R heart strain from current asthma exacerbation. Patient has no chest pain. No palpitation. Will follow up in clinic  # Alcohol use - pt has had several prior encounters for intoxication.  Notes drinking 12 beers on the night prior to admission. Treated with CIWA protocol. Patient has no withdraw symptoms before discharge.   # Tobacco abuse - patient recently started smoking 4 months ago. Encouraged cessation, patient in agreement  # Hyperglycemia/Glucosuria - A1C=5.7. No treatment needed.  # Right ear pain - No discharge, ear wax obstructs canal completely. Tympanic membrane cannot be evaluated. Patient complaining of intermittent hearing loss and pain. Patient will make his own appointment for ENT. We will provide the number for him. ENT office number [phone] was given to patient.  Patient promised to call for appointment on Monday.   # Prophy - lovenox  Dispo: d/c home and follow up in clinic    LOS: 1 day   Lorretta Harp 09/19/2012, 11:32 AM

## 2012-09-19 NOTE — Progress Notes (Signed)
**Note De-Identified  Obfuscation** RT note: Peak flow 400 L/M. Patient had good effort.
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
## 2012-09-19 NOTE — Progress Notes (Signed)
Discharged to home with family office visits in place teaching done  

## 2012-09-21 NOTE — Progress Notes (Signed)
   CARE MANAGEMENT NOTE 09/21/2012  Patient:  Jacob Gaines, Jacob Gaines   Account Number:  [id number]  Date Initiated:  09/21/2012  Documentation initiated by:  [facility]  Subjective/Objective Assessment:     Action/Plan:   Anticipated DC Date:  09/19/2012   Anticipated DC Plan:  HOME/SELF CARE      DC Planning Services  CM consult  Medication Assistance      Choice offered to / List presented to:             Status of service:  Completed, signed off Medicare Important Message given?   (If response is "NO", the following Medicare IM given date fields will be blank) Date Medicare IM given:   Date Additional Medicare IM given:    Discharge Disposition:  HOME/SELF CARE  Per UR Regulation:    If discussed at Long Length of Stay Meetings, dates discussed:    Comments:  09/21/2012 1400 NCM contacted pt and left message on voicemail. Isidoro Donning RN CCM Case Mgmt phone [phone]

## 2012-09-25 NOTE — Discharge Summary (Signed)
I saw Mr. Bender on the day of discharge and helped formulate the discharge plan.  Please note correction - he was sent home with an Rx for albuterol inhaler and Advair, the above note states no medications.   Signed Debe Coder, MD

## 2012-09-27 ENCOUNTER — Encounter (HOSPITAL_COMMUNITY): Payer: Self-pay | Admitting: Emergency Medicine

## 2012-09-27 ENCOUNTER — Inpatient Hospital Stay (HOSPITAL_COMMUNITY)
Admission: EM | Admit: 2012-09-27 | Discharge: 2012-09-29 | DRG: 202 | Disposition: A | Discharge status: Home / Self Care | Payer: MEDICAID | Attending: Internal Medicine | Admitting: Internal Medicine

## 2012-09-27 ENCOUNTER — Emergency Department (HOSPITAL_COMMUNITY): Payer: Self-pay

## 2012-09-27 DIAGNOSIS — E872 Acidosis, unspecified: Secondary | ICD-10-CM | POA: Diagnosis present

## 2012-09-27 DIAGNOSIS — F17200 Nicotine dependence, unspecified, uncomplicated: Secondary | ICD-10-CM | POA: Diagnosis present

## 2012-09-27 DIAGNOSIS — F411 Generalized anxiety disorder: Secondary | ICD-10-CM | POA: Diagnosis present

## 2012-09-27 DIAGNOSIS — R1013 Epigastric pain: Secondary | ICD-10-CM

## 2012-09-27 DIAGNOSIS — R81 Glycosuria: Secondary | ICD-10-CM | POA: Diagnosis present

## 2012-09-27 DIAGNOSIS — R780 Finding of alcohol in blood: Secondary | ICD-10-CM | POA: Diagnosis present

## 2012-09-27 DIAGNOSIS — F1010 Alcohol abuse, uncomplicated: Secondary | ICD-10-CM | POA: Diagnosis present

## 2012-09-27 DIAGNOSIS — J45901 Unspecified asthma with (acute) exacerbation: Principal | ICD-10-CM | POA: Diagnosis present

## 2012-09-27 DIAGNOSIS — G4730 Sleep apnea, unspecified: Secondary | ICD-10-CM | POA: Diagnosis present

## 2012-09-27 DIAGNOSIS — Z720 Tobacco use: Secondary | ICD-10-CM

## 2012-09-27 DIAGNOSIS — R000 Tachycardia, unspecified: Secondary | ICD-10-CM | POA: Diagnosis present

## 2012-09-27 LAB — CBC WITH DIFFERENTIAL/PLATELET
Basophils Relative: 0 % (ref 0–1)
Eosinophils Absolute: 0 10*3/uL (ref 0.0–0.7)
Eosinophils Relative: 0 % (ref 0–5)
MCH: 31 pg (ref 26.0–34.0)
MCV: 90.3 fL (ref 78.0–100.0)
Neutrophils Relative %: 47 % (ref 43–77)
Platelets: 202 10*3/uL (ref 150–400)
RBC: 5.16 MIL/uL (ref 4.22–5.81)
RDW: 13 % (ref 11.5–15.5)

## 2012-09-27 LAB — COMPREHENSIVE METABOLIC PANEL
BUN: 18 mg/dL (ref 6–23)
CO2: 24 mEq/L (ref 19–32)
Chloride: 107 mEq/L (ref 96–112)
Creatinine, Ser: 0.91 mg/dL (ref 0.50–1.35)
GFR calc non Af Amer: >90 mL/min (ref >90)
Glucose, Bld: 153 mg/dL — ABNORMAL HIGH (ref 70–99)
Total Bilirubin: 0.2 mg/dL — ABNORMAL LOW (ref 0.3–1.2)

## 2012-09-27 LAB — D-DIMER, QUANTITATIVE: D-Dimer, Quant: <0.27 ug/mL-FEU (ref 0.00–0.48)

## 2012-09-27 LAB — URINALYSIS, ROUTINE W REFLEX MICROSCOPIC
Hgb urine dipstick: NEGATIVE
Ketones, ur: NEGATIVE mg/dL
Leukocytes, UA: NEGATIVE
Protein, ur: NEGATIVE mg/dL
Urobilinogen, UA: 0.2 mg/dL (ref 0.0–1.0)

## 2012-09-27 LAB — ETHANOL: Alcohol, Ethyl (B): 184 mg/dL — ABNORMAL HIGH (ref 0–11)

## 2012-09-27 LAB — RAPID URINE DRUG SCREEN, HOSP PERFORMED
Opiates: NOT DETECTED
Tetrahydrocannabinol: NOT DETECTED

## 2012-09-27 MED ORDER — ADULT MULTIVITAMIN W/MINERALS CH
1.0000 | ORAL_TABLET | Freq: Every day | ORAL | Status: DC
  Administered 2012-09-28 – 2012-09-29 (×2): 1 via ORAL
  Filled 2012-09-27 (×2): qty 1

## 2012-09-27 MED ORDER — FOLIC ACID 1 MG PO TABS
1.0000 mg | ORAL_TABLET | Freq: Every day | ORAL | Status: DC
  Administered 2012-09-28: 1 mg via ORAL
  Filled 2012-09-27: qty 1

## 2012-09-27 MED ORDER — SODIUM CHLORIDE 0.9 % IV BOLUS (SEPSIS)
1000.0000 mL | Freq: Once | INTRAVENOUS | Status: AC
  Administered 2012-09-27: 1000 mL via INTRAVENOUS

## 2012-09-27 MED ORDER — GI COCKTAIL ~~LOC~~
30.0000 mL | Freq: Once | ORAL | Status: AC
  Administered 2012-09-27: 30 mL via ORAL
  Filled 2012-09-27: qty 30

## 2012-09-27 MED ORDER — LORAZEPAM 2 MG/ML IJ SOLN: 1.0000 mg | Freq: Four times a day (QID) | INTRAMUSCULAR | Status: DC | PRN

## 2012-09-27 MED ORDER — VITAMIN B-1 100 MG PO TABS
100.0000 mg | ORAL_TABLET | Freq: Every day | ORAL | Status: DC
  Administered 2012-09-28 – 2012-09-29 (×2): 100 mg via ORAL
  Filled 2012-09-27 (×2): qty 1

## 2012-09-27 MED ORDER — LEVALBUTEROL HCL 1.25 MG/0.5ML IN NEBU
1.2500 mg | INHALATION_SOLUTION | Freq: Four times a day (QID) | RESPIRATORY_TRACT | Status: DC
  Administered 2012-09-28 (×2): 1.25 mg via RESPIRATORY_TRACT
  Filled 2012-09-27 (×6): qty 0.5

## 2012-09-27 MED ORDER — THIAMINE HCL 100 MG/ML IJ SOLN
100.0000 mg | Freq: Every day | INTRAMUSCULAR | Status: DC
  Filled 2012-09-27: qty 1

## 2012-09-27 MED ORDER — SODIUM CHLORIDE 0.9 % IV BOLUS (SEPSIS)
1000.0000 mL | INTRAVENOUS | Status: AC
  Administered 2012-09-27: 1000 mL via INTRAVENOUS

## 2012-09-27 MED ORDER — SODIUM CHLORIDE 0.9 % IV SOLN: 250.0000 mL | INTRAVENOUS | Status: DC | PRN

## 2012-09-27 MED ORDER — IPRATROPIUM BROMIDE 0.02 % IN SOLN
0.5000 mg | Freq: Four times a day (QID) | RESPIRATORY_TRACT | Status: DC
  Administered 2012-09-28 (×2): 0.5 mg via RESPIRATORY_TRACT
  Filled 2012-09-27 (×2): qty 2.5

## 2012-09-27 MED ORDER — LORAZEPAM 1 MG PO TABS
1.0000 mg | ORAL_TABLET | Freq: Four times a day (QID) | ORAL | Status: DC | PRN
  Administered 2012-09-28 (×2): 1 mg via ORAL
  Filled 2012-09-27 (×2): qty 1

## 2012-09-27 MED ORDER — ADULT MULTIVITAMIN W/MINERALS CH
1.0000 | ORAL_TABLET | Freq: Every day | ORAL | Status: DC
  Filled 2012-09-27: qty 1

## 2012-09-27 MED ORDER — SODIUM CHLORIDE 0.9 % IJ SOLN
3.0000 mL | Freq: Two times a day (BID) | INTRAMUSCULAR | Status: DC
  Administered 2012-09-28: 3 mL via INTRAVENOUS

## 2012-09-27 MED ORDER — IPRATROPIUM BROMIDE 0.02 % IN SOLN: 0.5000 mg | Freq: Four times a day (QID) | RESPIRATORY_TRACT | Status: DC

## 2012-09-27 MED ORDER — VITAMIN B-1 100 MG PO TABS
100.0000 mg | ORAL_TABLET | Freq: Every day | ORAL | Status: DC
  Filled 2012-09-27: qty 1

## 2012-09-27 MED ORDER — LEVALBUTEROL HCL 1.25 MG/0.5ML IN NEBU
1.2500 mg | INHALATION_SOLUTION | Freq: Four times a day (QID) | RESPIRATORY_TRACT | Status: DC | PRN
  Filled 2012-09-27: qty 0.5

## 2012-09-27 MED ORDER — SODIUM CHLORIDE 0.9 % IJ SOLN
3.0000 mL | Freq: Two times a day (BID) | INTRAMUSCULAR | Status: DC
  Administered 2012-09-28 – 2012-09-29 (×3): 3 mL via INTRAVENOUS

## 2012-09-27 MED ORDER — SODIUM CHLORIDE 0.9 % IJ SOLN: 3.0000 mL | INTRAMUSCULAR | Status: DC | PRN

## 2012-09-27 MED ORDER — ALBUTEROL SULFATE HFA 108 (90 BASE) MCG/ACT IN AERS
6.0000 | INHALATION_SPRAY | Freq: Once | RESPIRATORY_TRACT | Status: AC
  Administered 2012-09-27: 6 via RESPIRATORY_TRACT
  Filled 2012-09-27: qty 6.7

## 2012-09-27 MED ORDER — HEPARIN SODIUM (PORCINE) 5000 UNIT/ML IJ SOLN
5000.0000 [IU] | Freq: Three times a day (TID) | INTRAMUSCULAR | Status: DC
  Administered 2012-09-28 – 2012-09-29 (×5): 5000 [IU] via SUBCUTANEOUS
  Filled 2012-09-27 (×8): qty 1

## 2012-09-27 MED ORDER — LORAZEPAM 1 MG PO TABS
1.0000 mg | ORAL_TABLET | Freq: Once | ORAL | Status: AC
  Administered 2012-09-27: 1 mg via ORAL
  Filled 2012-09-27: qty 1

## 2012-09-27 MED ORDER — LEVALBUTEROL HCL 0.63 MG/3ML IN NEBU: 0.6300 mg | INHALATION_SOLUTION | Freq: Four times a day (QID) | RESPIRATORY_TRACT | Status: DC

## 2012-09-27 MED ORDER — PREDNISONE 20 MG PO TABS
40.0000 mg | ORAL_TABLET | Freq: Every day | ORAL | Status: DC
  Administered 2012-09-28: 40 mg via ORAL
  Filled 2012-09-27 (×2): qty 2

## 2012-09-27 MED ORDER — FOLIC ACID 1 MG PO TABS
1.0000 mg | ORAL_TABLET | Freq: Every day | ORAL | Status: DC
  Administered 2012-09-29: 1 mg via ORAL
  Filled 2012-09-27 (×2): qty 1

## 2012-09-27 MED ORDER — IBUPROFEN 200 MG PO TABS
400.0000 mg | ORAL_TABLET | Freq: Once | ORAL | Status: AC
  Administered 2012-09-27: 400 mg via ORAL
  Filled 2012-09-27: qty 2

## 2012-09-27 NOTE — Progress Notes (Addendum)
Pt arrived to unit on 8/24 at 2200 via EMS. A/O x4, v/s stable, with 2L O2 via nasal canula. Skin intact, IV placed RFA prior to arrival, SL. MD paged, no orders at this time. Pt was oriented to room, unit and staff. Will continue to monitor pt.

## 2012-09-27 NOTE — ED Notes (Signed)
Patient c/o upper abdominal pain which started suddenly.  MD notified.  Rates pain as a 5.  Pt reports drinking one beer today.  Denies regular ETOH use.  MD notified.

## 2012-09-27 NOTE — ED Provider Notes (Signed)
3:37 PM Accepted care from Dr. Ranae Palms. [name] here intoxicated, elev etoh level, sob en route w/ hx of asthma. S/p alb neb x 3 and std's. Feeling better, but still intoxicated. Will allow pt to metabolize and re-evaluate.   Pt remains tachycardic after IVF. He is also diaphoretic, mildly tachypneic, and continues to c/o sob despite lungs sounding clear. Will give ativan.   Will admit to teaching service as pt remains sob.  Clinical Impression 1. Asthma exacerbation   2. Elevated ETOH level      Jacob Argyle, MD 09/27/12 2112

## 2012-09-27 NOTE — ED Notes (Signed)
Bed: WA23 Expected date:  Expected time:  Means of arrival:  Comments: EMS 

## 2012-09-27 NOTE — ED Notes (Signed)
SISTER'S [phone]

## 2012-09-27 NOTE — ED Provider Notes (Signed)
CSN: [id number]     Arrival date & time 09/27/12  1218 History     First MD Initiated Contact with Patient 09/27/12 1220     Chief Complaint  Patient presents with  . Shortness of Breath   (Consider location/radiation/quality/duration/timing/severity/associated sxs/prior Treatment) HPI Pt with SOB worsening this morning. Has history of asthma and used inhaler at home with no relief. EMS called. Pt with audible wheezing. Received atrovent/albuterol nebs and IV solumedrol. Minimal relief. No cough, fever, chills, CP, lower ext swelling or pain.  Past Medical History  Diagnosis Date  . ETOH abuse   . Asthma   . Tobacco abuse   . Enlarged heart     "when I was little" (09/18/2012)  . Chest pain     "only related to asthma attack" (09/18/2012)  . Shortness of breath     " related to asthma attack; sometimes in the mornings too" (09/18/2012)  . Sleep apnea     "I don't wear a mask" (09/18/2012)  . Headache(784.0)     "only related to asthma attack" (09/18/2012)   Past Surgical History  Procedure Laterality Date  . Laparoscopic cholecystectomy  2007    Performed in [place]   Family History  Problem Relation Age of Onset  . Heart attack Paternal Grandfather 36  . Colon cancer Paternal Grandmother    History  Substance Use Topics  . Smoking status: Current Every Day Smoker -- 0.25 packs/day for .3 years    Types: Cigarettes  . Smokeless tobacco: Never Used  . Alcohol Use: 0.0 oz/week     Comment: 09/18/2012 Drinks a 40-oz beer maybe/month"    Review of Systems  Constitutional: Negative for fever and chills.  HENT: Negative for sore throat and facial swelling.   Respiratory: Positive for shortness of breath and wheezing. Negative for cough.   Cardiovascular: Negative for chest pain, palpitations and leg swelling.  Gastrointestinal: Negative for nausea, vomiting and abdominal pain.  Musculoskeletal: Negative for myalgias and back pain.  Skin: Negative for rash and wound.   Neurological: Negative for dizziness, weakness, light-headedness, numbness and headaches.  All other systems reviewed and are negative.    Allergies  Review of patient's allergies indicates no known allergies.  Home Medications   Current Outpatient Rx  Name  Route  Sig  Dispense  Refill  . albuterol (PROVENTIL HFA;VENTOLIN HFA) 108 (90 BASE) MCG/ACT inhaler   Inhalation   Inhale 2 puffs into the lungs every 6 (six) hours as needed for wheezing.   1 Inhaler   2   . Fluticasone-Salmeterol (ADVAIR DISKUS) 250-50 MCG/DOSE AEPB   Inhalation   Inhale 2 puffs into the lungs 2 (two) times daily.   60 each   3   . predniSONE (DELTASONE) 20 MG tablet   Oral   Take 2 tablets (40 mg total) by mouth daily with breakfast.   6 tablet   0    BP 101/75  Pulse 136  Resp 20  Wt 160 lb (72.576 kg)  BMI 27.45 kg/m2  SpO2 99% Physical Exam  Nursing note and vitals reviewed. Constitutional: He is oriented to person, place, and time. He appears well-developed and well-nourished. No distress.  HENT:  Head: Normocephalic and atraumatic.  Mouth/Throat: Oropharynx is clear and moist.  Eyes: EOM are normal. Pupils are equal, round, and reactive to light.  Neck: Normal range of motion. Neck supple.  Cardiovascular: Regular rhythm.   tachycardia  Pulmonary/Chest: Effort normal.  Pt making voluntary upper airway sounds transmitted  to lungs. Minimal wheezing when asked to stop making voluntary sounds.   Abdominal: Soft. Bowel sounds are normal. He exhibits no distension and no mass. There is no tenderness. There is no rebound and no guarding.  Musculoskeletal: Normal range of motion. He exhibits no edema and no tenderness.  Neurological: He is alert and oriented to person, place, and time.  Moves all ext without deficit, sensation grossly intact  Skin: Skin is warm and dry. No rash noted. No erythema.  Psychiatric: He has a normal mood and affect. His behavior is normal.    ED Course    Procedures (including critical care time)  Labs Reviewed - No data to display No results found. No diagnosis found.  Date: 09/27/2012  Rate: 127  Rhythm: sinus tachycardia  QRS Axis: normal  Intervals: normal  ST/T Wave abnormalities: normal  Conduction Disutrbances:none  Narrative Interpretation:   Old EKG Reviewed: unchanged   MDM  Pt with resolved wheezing. HR improved to low 100's but then increased to 120's when I walked into room. Pt denied daily alcohol use. BP soft around 90-100. Will give IVF/s and reassess.   Signed out to oncoming EDP pending sobriety and re-assessment.   Loren Racer, MD 09/28/12 [phone]

## 2012-09-27 NOTE — ED Notes (Signed)
Patient with history of asthma became SOB this morning.  Had nebulizer treatment at home and albuterol inhaler before EMS arrived.  EMS gave 2 albuterol and atrovent treatments.  Patient coughing and having difficulty moving air.  Was hospitalized last week for exacerbation of asthma.

## 2012-09-28 ENCOUNTER — Observation Stay (HOSPITAL_COMMUNITY): Payer: Self-pay

## 2012-09-28 ENCOUNTER — Encounter (HOSPITAL_COMMUNITY): Payer: Self-pay

## 2012-09-28 LAB — CBC
MCH: 31.9 pg (ref 26.0–34.0)
MCHC: 35.6 g/dL (ref 30.0–36.0)
MCV: 89.6 fL (ref 78.0–100.0)
Platelets: 176 10*3/uL (ref 150–400)
RBC: 4.51 MIL/uL (ref 4.22–5.81)
RDW: 13.2 % (ref 11.5–15.5)

## 2012-09-28 LAB — BASIC METABOLIC PANEL
CO2: 22 mEq/L (ref 19–32)
Chloride: 106 mEq/L (ref 96–112)
Glucose, Bld: 144 mg/dL — ABNORMAL HIGH (ref 70–99)
Potassium: 3.9 mEq/L (ref 3.5–5.1)
Sodium: 139 mEq/L (ref 135–145)

## 2012-09-28 LAB — HEPATIC FUNCTION PANEL
Bilirubin, Direct: <0.1 mg/dL (ref 0.0–0.3)
Total Protein: 7.1 g/dL (ref 6.0–8.3)

## 2012-09-28 LAB — LIPASE, BLOOD: Lipase: 40 U/L (ref 11–59)

## 2012-09-28 MED ORDER — PREDNISONE 50 MG PO TABS
60.0000 mg | ORAL_TABLET | Freq: Every day | ORAL | Status: DC
  Administered 2012-09-29: 60 mg via ORAL
  Filled 2012-09-28 (×2): qty 1

## 2012-09-28 MED ORDER — LEVALBUTEROL HCL 1.25 MG/0.5ML IN NEBU
1.2500 mg | INHALATION_SOLUTION | Freq: Four times a day (QID) | RESPIRATORY_TRACT | Status: DC
  Filled 2012-09-28 (×4): qty 0.5

## 2012-09-28 MED ORDER — ONDANSETRON HCL 4 MG PO TABS: 4.0000 mg | ORAL_TABLET | Freq: Once | ORAL | Status: DC

## 2012-09-28 MED ORDER — PANTOPRAZOLE SODIUM 40 MG PO TBEC
40.0000 mg | DELAYED_RELEASE_TABLET | Freq: Every day | ORAL | Status: DC
  Administered 2012-09-28: 40 mg via ORAL
  Filled 2012-09-28: qty 1

## 2012-09-28 MED ORDER — PREDNISONE 20 MG PO TABS
20.0000 mg | ORAL_TABLET | Freq: Once | ORAL | Status: AC
  Administered 2012-09-28: 20 mg via ORAL
  Filled 2012-09-28 (×2): qty 1

## 2012-09-28 MED ORDER — IPRATROPIUM BROMIDE 0.02 % IN SOLN
0.5000 mg | Freq: Four times a day (QID) | RESPIRATORY_TRACT | Status: AC
  Administered 2012-09-28: 0.5 mg via RESPIRATORY_TRACT
  Filled 2012-09-28: qty 2.5

## 2012-09-28 MED ORDER — ONDANSETRON HCL 4 MG PO TABS: 4.0000 mg | ORAL_TABLET | Freq: Once | ORAL | Status: AC | PRN

## 2012-09-28 MED ORDER — LEVALBUTEROL HCL 1.25 MG/0.5ML IN NEBU
1.2500 mg | INHALATION_SOLUTION | Freq: Four times a day (QID) | RESPIRATORY_TRACT | Status: DC
  Administered 2012-09-28 – 2012-09-29 (×4): 1.25 mg via RESPIRATORY_TRACT
  Filled 2012-09-28 (×8): qty 0.5

## 2012-09-28 MED ORDER — ONDANSETRON HCL 4 MG/2ML IJ SOLN: 4.0000 mg | Freq: Four times a day (QID) | INTRAMUSCULAR | Status: DC | PRN

## 2012-09-28 MED ORDER — IPRATROPIUM BROMIDE 0.02 % IN SOLN: 0.5000 mg | Freq: Four times a day (QID) | RESPIRATORY_TRACT | Status: DC

## 2012-09-28 NOTE — H&P (Signed)
Date: 09/28/2012               Patient Name:  Jacob Gaines MRN: [id number]  DOB: 08/07/1965 Age / Sex: 47 y.o., male   PCP: No Pcp Per Patient         Medical Service: Internal Medicine Teaching Service         Attending Physician: Dr. Farley Ly, MD    First Contact: Cottie Banda, MS4 Pager: [phone]  Second Contact: Dr. Lorretta Harp Pager: [phone]       After Hours (After 5p/  First Contact Pager: [phone]  weekends / holidays): Second Contact Pager: [phone]   Chief Complaint: Wheezing  History of Present Illness:  [name] is a 47 y.o. man with PMH asthma, alcohol abuse who presents as a transfer from [facility] complaining of SOB, wheezing and chest tightness.  The patient was in his usual state of health until he awoke this morning from sleep with a non-productive cough, dyspnea, and wheezing. As the day wore on he also developed chest "tightness". He also found it hard to speak in full sentences at that time, so decided to come to the ED. He did not use his albuterol rescue inhaler because he says he was told "not to use it too much because of the steroids." The patient notes no fevers, chills, nausea, vomiting. He states his symptoms feel similar to his prior asthma exacerbations. His triggers are usually environment/allergic in nature. He thinks the reason for his current attack was a house cat he encountered during a visit to a friend's house.  He reports a long history of asthma since childhood, which appears to have been relatively well controlled until he ran out of his medications 3 months ago. Since then, he admits he has been managing his asthma via trips to the emergency room. Adding to his poor control is the fact that he started smoking cigarettes 6 months ago, now up to 1 PPD. Some of his asthma attacks have required steroids and hospitalization; most recently he was admitted to our service on 09/18/12. He has never been intubated.  In the ED he was  given IV solumedrol and Duonebs. He was also found to have an EtOH level of 184. He states that his last drink was a 12-pack of beer 2 days ago. He drinks this amount every other day. He denies a history of alcohol withdrawal or seizures.   Current Outpatient Prescriptions on File Prior to Encounter  Medication Sig Dispense Refill  . albuterol (PROVENTIL HFA;VENTOLIN HFA) 108 (90 BASE) MCG/ACT inhaler Inhale 2 puffs into the lungs every 6 (six) hours as needed for wheezing.  1 Inhaler  2  . Fluticasone-Salmeterol (ADVAIR DISKUS) 250-50 MCG/DOSE AEPB Inhale 2 puffs into the lungs 2 (two) times daily.  60 each  3    Meds: Current Facility-Administered Medications  Medication Dose Route Frequency Provider Last Rate Last Dose  . 0.9 %  sodium chloride infusion  250 mL Intravenous PRN Manuela Schwartz, MD      . folic acid (FOLVITE) tablet 1 mg  1 mg Oral Daily Manuela Schwartz, MD      . folic acid (FOLVITE) tablet 1 mg  1 mg Oral Daily Manuela Schwartz, MD      . heparin injection 5,000 Units  5,000 Units Subcutaneous Q8H Manuela Schwartz, MD   5,000 Units at 09/28/12 0015  . levalbuterol (XOPENEX) nebulizer solution 1.25 mg  1.25 mg  Nebulization Q6H Manuela Schwartz, MD   1.25 mg at 09/28/12 0040   And  . ipratropium (ATROVENT) nebulizer solution 0.5 mg  0.5 mg Nebulization Q6H Manuela Schwartz, MD   0.5 mg at 09/28/12 0040  . levalbuterol (XOPENEX) nebulizer solution 1.25 mg  1.25 mg Nebulization Q6H PRN Manuela Schwartz, MD      . LORazepam (ATIVAN) tablet 1 mg  1 mg Oral Q6H PRN Manuela Schwartz, MD   1 mg at 09/28/12 0013   Or  . LORazepam (ATIVAN) injection 1 mg  1 mg Intravenous Q6H PRN Manuela Schwartz, MD      . multivitamin with minerals tablet 1 tablet  1 tablet Oral Daily Manuela Schwartz, MD      . multivitamin with minerals tablet 1 tablet  1 tablet Oral Daily Manuela Schwartz, MD      . predniSONE  (DELTASONE) tablet 40 mg  40 mg Oral Q breakfast Manuela Schwartz, MD      . sodium chloride 0.9 % injection 3 mL  3 mL Intravenous Q12H Manuela Schwartz, MD      . sodium chloride 0.9 % injection 3 mL  3 mL Intravenous Q12H Manuela Schwartz, MD   3 mL at 09/28/12 0024  . sodium chloride 0.9 % injection 3 mL  3 mL Intravenous PRN Manuela Schwartz, MD      . thiamine (VITAMIN B-1) tablet 100 mg  100 mg Oral Daily Manuela Schwartz, MD       Or  . thiamine (B-1) injection 100 mg  100 mg Intravenous Daily Manuela Schwartz, MD      . thiamine (VITAMIN B-1) tablet 100 mg  100 mg Oral Daily Manuela Schwartz, MD        Allergies: Allergies as of 09/27/2012  . (No Known Allergies)   Past Medical History  Diagnosis Date  . ETOH abuse   . Asthma   . Tobacco abuse   . Enlarged heart     "when I was little" (09/18/2012)  . Chest pain     "only related to asthma attack" (09/18/2012)  . Shortness of breath     " related to asthma attack; sometimes in the mornings too" (09/18/2012)  . Sleep apnea     "I don't wear a mask" (09/18/2012)  . Headache(784.0)     "only related to asthma attack" (09/18/2012)   Past Surgical History  Procedure Laterality Date  . Laparoscopic cholecystectomy  2007    Performed in [place]   Family History  Problem Relation Age of Onset  . Heart attack Paternal Grandfather 8  . Colon cancer Paternal Grandmother    History   Social History  . Marital Status: Single    Spouse Name: N/A    Number of Children: N/A  . Years of Education: N/A   Occupational History  . Not on file.   Social History Main Topics  . Smoking status: Current Every Day Smoker -- 0.25 packs/day for .3 years    Types: Cigarettes  . Smokeless tobacco: Never Used  . Alcohol Use: 0.0 oz/week     Comment: 09/18/2012 Drinks a 40-oz beer maybe/month"  . Drug Use: No  . Sexual Activity: No   Other Topics Concern  . Not on file   Social  History Narrative   Works on a Veterinary surgeon.  Lives with a friend.  Does not have a PCP.    Review of  Systems: Pertinent items are noted in HPI.  Physical Exam: Blood pressure 125/75, pulse 105, temperature 99.1 F (37.3 C), temperature source Oral, resp. rate 20, height 5\' 5"  (1.651 m), weight 155 lb 3.3 oz (70.4 kg), SpO2 97.00%. Physical Exam  Constitutional: He is oriented to person, place, and time and well-developed, well-nourished, and in no distress. No distress.  HENT:  Head: Normocephalic and atraumatic.  Mouth/Throat: Oropharynx is clear and moist. No oropharyngeal exudate.  Eyes: Conjunctivae and EOM are normal. Pupils are equal, round, and reactive to light.  Neck: Normal range of motion. Neck supple.  Cardiovascular: Regular rhythm, normal heart sounds and intact distal pulses.  Tachycardia present.  Exam reveals no gallop and no friction rub.   No murmur heard. Pulmonary/Chest: Effort normal and breath sounds normal. No respiratory distress. He has no wheezes. He has no rales. He exhibits no tenderness.  Examined him after a breathing treatment. Moving air well. Normal I:E ratio. No wheezes.  Abdominal: Soft. Bowel sounds are normal. He exhibits no distension. There is no tenderness.  Musculoskeletal: Normal range of motion. He exhibits no edema and no tenderness.  Neurological: He is alert and oriented to person, place, and time. No cranial nerve deficit. GCS score is 15.  Skin: Skin is warm and dry. He is not diaphoretic.  No cyanosis. No clubbing.  Psychiatric:  Appears intoxicated. Slowed responses.     Lab results: Basic Metabolic Panel:  Recent Labs  96/04/54 1300  NA 145  K 3.7  CL 107  CO2 24  GLUCOSE 153*  BUN 18  CREATININE 0.91  CALCIUM 8.6   Liver Function Tests:  Recent Labs  09/27/12 1300  AST 15  ALT 22  ALKPHOS 106  BILITOT 0.2*  PROT 6.8  ALBUMIN 3.6   CBC:  Recent Labs  09/27/12 1300  WBC 6.0  NEUTROABS 2.8    HGB 16.0  HCT 46.6  MCV 90.3  PLT 202   D-Dimer:  Recent Labs  09/27/12 1500  DDIMER <0.27   Urine Drug Screen: Drugs of Abuse     Component Value Date/Time   LABOPIA NONE DETECTED 09/27/2012 1313   COCAINSCRNUR NONE DETECTED 09/27/2012 1313   LABBENZ NONE DETECTED 09/27/2012 1313   AMPHETMU NONE DETECTED 09/27/2012 1313   THCU NONE DETECTED 09/27/2012 1313   LABBARB NONE DETECTED 09/27/2012 1313    Alcohol Level:  Recent Labs  09/27/12 1300  ETH 184*   Urinalysis:  Recent Labs  09/27/12 1312  COLORURINE YELLOW  LABSPEC 1.030  PHURINE 5.5  GLUCOSEU NEGATIVE  HGBUR NEGATIVE  BILIRUBINUR NEGATIVE  KETONESUR NEGATIVE  PROTEINUR NEGATIVE  UROBILINOGEN 0.2  NITRITE NEGATIVE  LEUKOCYTESUR NEGATIVE     Imaging results:  Dg Chest Port 1 View  09/27/2012   *RADIOLOGY REPORT*  Clinical Data: Chest pain, shortness of breath.  PORTABLE CHEST - 1 VIEW  Comparison: 09/18/2012  Findings: Patchy subsegmental atelectasis or scarring at both lung bases, stable.  No confluent airspace infiltrate.  Heart size normal.  No effusion.  Regional bones unremarkable.  IMPRESSION:  1.  Stable chest.   Original Report Authenticated By: D. Andria Rhein, MD    Other results: EKG: unchanged from previous tracings, normal sinus rhythm, sinus tachycardia. S1Q3 noted today as well as on prior.  Assessment & Plan by Problem: Mr. TAYTE MCWHERTER is a 46 y.o. man with PMH asthma, alcohol abuse who presents complaining of SOB, wheezing and chest tightness.  #Asthma exacerbation - The patient presents with another  asthma exacerbation, likely triggered by cat dander exposure and cigarette smoking. He apparently did not know the proper way to use his rescue inhaler, though this seems strange since he has been managing his asthma since childhood. Patient is also an unreliable historian given intoxication. Lung exam is virtually normal after getting IV solumedrol and Duonebs, though he does still endorse  subjective SOB. - Admit to IMTS, telemetry - Levalbuterol nebs q6hrs, as well as q3hrs prn (due to tachycardia)  - Prednisone 40 daily starting tomorrow am - Encourage smoking cessation  - Will perform additional education on medication use at discharge  # Sinus tachycardia - Likely due to a combination of albuterol usage, asthma exacerbation. S1Q3 noted on EKG likely represents transient right heart strain. Pulmonary embolism is a concern but his D-dimer was negative. Alcohol withdrawal not likely as patient is intoxicated with an EtOH level of 189. - Will observe on telemetry  #S1Q3 noted on EKG - Likely represents transient right heart strain in the context of an asthma exacerbation. - Will repeat EKG with resolution of symptoms  #Elevated EtOH level - 189 today. - CIWA protocol - Folate, multivitamin, thiamine supplementation  # Tobacco abuse - Patient just started smoking 6 months ago and has worked his way up to 1 PPD. He states he is ready to quit. - Nicotine patch prn  - Will again encourage cessation  #DVT PPX - Heparin subq  Dispo: Disposition is deferred at this time, awaiting improvement of current medical problems. Anticipated discharge in approximately 1-3 day(s).   The patient unsure have a current PCP (No Pcp Per Patient) and does need an [facility] hospital follow-up appointment after discharge.  The patient does not have transportation limitations that hinder transportation to clinic appointments.  Signed: Vivi Barrack, MD 09/28/2012, 1:42 AM

## 2012-09-28 NOTE — Progress Notes (Signed)
Utilization review completed.  

## 2012-09-28 NOTE — H&P (Signed)
Internal Medicine Attending Admission Note Date: 09/28/2012  Patient name: Jacob Gaines Medical record number: [id number] Date of birth: 1965-10-10 Age: 48 y.o. Gender: male  I saw and evaluated the patient. I reviewed the resident's note and I agree with the resident's findings and plan as documented in the resident's note, with the following additional comments.  Chief Complaint(s): Shortness of breath, wheezing  History - key components related to admission: Patient is a 47 year old man with history of asthma, alcohol abuse, and other problems as outlined in the medical history, status post recent hospitalization here at [facility] 9/15-9/16/2014 for treatment of an asthma exacerbation, now admitted with recurrent symptoms of wheezing and shortness of breath  Physical Exam - key components related to admission:  Filed Vitals:   09/28/12 0414 09/28/12 0517 09/28/12 0556 09/28/12 1000  BP: 117/69  114/76 113/72  Pulse: 84  78 68  Temp: 98.9 F (37.2 C)   98.5 F (36.9 C)  TempSrc: Oral   Oral  Resp: 20   20  Height:      Weight:      SpO2: 97% 95%  96%    General: Alert, mildly tremulous Lungs: Clear at the time of my exam following bronchodilator treatment Heart: Regular; no extra sounds or murmurs Abdomen: Bowel sounds present, soft, nontender Extremities: No edema   Lab results:   Basic Metabolic Panel:  Recent Labs  64/40/34 1300 09/28/12 0445  NA 145 139  K 3.7 3.9  CL 107 106  CO2 24 22  GLUCOSE 153* 144*  BUN 18 16  CREATININE 0.91 0.62  CALCIUM 8.6 9.0    Liver Function Tests:  Recent Labs  09/27/12 1300  AST 15  ALT 22  ALKPHOS 106  BILITOT 0.2*  PROT 6.8  ALBUMIN 3.6     CBC:  Recent Labs  09/27/12 1300 09/28/12 0445  WBC 6.0 7.9  HGB 16.0 14.4  HCT 46.6 40.4  MCV 90.3 89.6  PLT 202 176    Recent Labs  09/27/12 1300  NEUTROABS 2.8  LYMPHSABS 2.9  MONOABS 0.3  EOSABS 0.0  BASOSABS 0.0      D-Dimer:  Recent Labs  09/27/12 1500  DDIMER <0.27      Urine Drug Screen: Drugs of Abuse     Component Value Date/Time   LABOPIA NONE DETECTED 09/27/2012 1313   COCAINSCRNUR NONE DETECTED 09/27/2012 1313   LABBENZ NONE DETECTED 09/27/2012 1313   AMPHETMU NONE DETECTED 09/27/2012 1313   THCU NONE DETECTED 09/27/2012 1313   LABBARB NONE DETECTED 09/27/2012 1313     Alcohol Level:  Recent Labs  09/27/12 1300  ETH 184*    Urinalysis    Component Value Date/Time   COLORURINE YELLOW 09/27/2012 1312   APPEARANCEUR CLEAR 09/27/2012 1312   LABSPEC 1.030 09/27/2012 1312   PHURINE 5.5 09/27/2012 1312   GLUCOSEU NEGATIVE 09/27/2012 1312   HGBUR NEGATIVE 09/27/2012 1312   BILIRUBINUR NEGATIVE 09/27/2012 1312   KETONESUR NEGATIVE 09/27/2012 1312   PROTEINUR NEGATIVE 09/27/2012 1312   UROBILINOGEN 0.2 09/27/2012 1312   NITRITE NEGATIVE 09/27/2012 1312   LEUKOCYTESUR NEGATIVE 09/27/2012 1312    Imaging results:  Dg Chest 2 View  09/28/2012   *RADIOLOGY REPORT*  Clinical Data: Asthma, shortness of breath  CHEST - 2 VIEW  Comparison: 09/27/2012  Findings: The heart and pulmonary vascularity are within normal limits.  The lungs are well-aerated bilaterally without focal infiltrate or sizable effusion.  No acute bony abnormality is noted.  IMPRESSION:  No acute abnormalities seen.   Original Report Authenticated By: Alcide Clever, M.D.   Dg Chest Port 1 View  09/27/2012   *RADIOLOGY REPORT*  Clinical Data: Chest pain, shortness of breath.  PORTABLE CHEST - 1 VIEW  Comparison: 09/18/2012  Findings: Patchy subsegmental atelectasis or scarring at both lung bases, stable.  No confluent airspace infiltrate.  Heart size normal.  No effusion.  Regional bones unremarkable.  IMPRESSION:  1.  Stable chest.   Original Report Authenticated By: D. Andria Rhein, MD    Other results: EKG: Sinus tachycardia, rate 127; otherwise normal  Assessment & Plan by Problem:  1.  Asthma exacerbation.  Possibly  triggered by environmental exposure; compliance with bronchodilators is unclear.  Patient has improved following initial treatment with IV steroids and inhaled bronchodilator.  Plan is continue steroids; inhaled bronchodilators; follow peak flows; asthma education.  2.  Alcohol abuse.  Patient is mildly tremulous, but does not have signs or symptoms of overt withdrawal.  Plan is thiamine/folate; CIWA protocol; social work consult for counseling/rehabilitation information.  3.  Tobacco.  Patient denies smoking since his recent hospital discharge.  We reinforced to him the importance of abstinence from tobacco.  4.  Other problems and plans as per the resident physician's note.

## 2012-09-28 NOTE — Progress Notes (Signed)
I have seen the patient and reviewed the daily progress note by medical student,  Josh and discussed the care of the patient with them. Please see my note for documentation of my findings, assessment, and plans.   Travion Ke, MD PGY3, Internal Medicine Teaching Service Pager: [phone]   

## 2012-09-28 NOTE — Progress Notes (Signed)
[name] is a 47 y.o. gentleman with a history of asthma and several emergency department visits for alcohol related injuries who presented to the emergency department 8/24 complaining of an asthma attack after drinking heavily the night before and visiting a friend who had a cat.   Subjective: NAEON, Patient complains of nausea to nurse this am. -Patient complains of abdominal pain. -No SOB or wheezing. -Patient reports compliance with his Advair and albuterol inhalers at home prior to this event. -Endorses willingness to see outpatient clinic for follow up (after hospitalization last week he had declined an offer of a follow up appointment with the [facility]. -This morning patient denies smoking over the past week since his last hospitalization, in contradiction to the information given to Dr. Montez Morita on admission.   Objective: Vital signs in last 24 hours: Filed Vitals:   09/28/12 0414 09/28/12 0517 09/28/12 0556 09/28/12 1000  BP: 117/69  114/76 113/72  Pulse: 84  78 68  Temp: 98.9 F (37.2 C)   98.5 F (36.9 C)  TempSrc: Oral   Oral  Resp: 20   20  Height:      Weight:      SpO2: 97% 95%  96%   Weight change:   Intake/Output Summary (Last 24 hours) at 09/28/12 1313 Last data filed at 09/28/12 1000  Gross per 24 hour  Intake    503 ml  Output   1900 ml  Net  -1397 ml   General: alert, cooperative HEENT: PERRL Neck: supple Lungs: clear to ascultation bilaterally, normal work of respiration, no wheezes, rales, ronchi.  Peak flow 450 at bedside on exam.  Nonproductive cough provoked by deep inspiration. Heart: regular rate and rhythm, no murmurs, rubs or gallops, some sinus tachycardia noted on telemetry. Abdomen: soft, mild/diffuse tenderness to palpation, bowel sounds present Extremities: no cyanosis, clubbing, or edema Neurologic: alert & oriented X3, strength grossly intact, Anxious appearing with tremulous hands Lab Results: Basic Metabolic Panel:  Recent Labs  16/10/96 1300 09/28/12 0445  NA 145 139  K 3.7 3.9  CL 107 106  CO2 24 22  GLUCOSE 153* 144*  BUN 18 16  CREATININE 0.91 0.62  CALCIUM 8.6 9.0   Liver Function Tests:  Recent Labs  09/27/12 1300  AST 15  ALT 22  ALKPHOS 106  BILITOT 0.2*  PROT 6.8  ALBUMIN 3.6   No results found for this basename: LIPASE, AMYLASE,  in the last 72 hours No results found for this basename: AMMONIA,  in the last 72 hours CBC:  Recent Labs  09/27/12 1300 09/28/12 0445  WBC 6.0 7.9  NEUTROABS 2.8  --   HGB 16.0 14.4  HCT 46.6 40.4  MCV 90.3 89.6  PLT 202 176   Cardiac Enzymes: No results found for this basename: CKTOTAL, CKMB, CKMBINDEX, TROPONINI,  in the last 72 hours BNP: No results found for this basename: PROBNP,  in the last 72 hours D-Dimer:  Recent Labs  09/27/12 1500  DDIMER <0.27   CBG: No results found for this basename: GLUCAP,  in the last 72 hours Hemoglobin A1C: No results found for this basename: HGBA1C,  in the last 72 hours Fasting Lipid Panel: No results found for this basename: CHOL, HDL, LDLCALC, TRIG, CHOLHDL, LDLDIRECT,  in the last 72 hours Thyroid Function Tests: No results found for this basename: TSH, T4TOTAL, FREET4, T3FREE, THYROIDAB,  in the last 72 hours Anemia Panel: No results found for this basename: VITAMINB12, FOLATE, FERRITIN, TIBC, IRON,  RETICCTPCT,  in the last 72 hours Coagulation: No results found for this basename: LABPROT, INR,  in the last 72 hours Urine Drug Screen: Drugs of Abuse     Component Value Date/Time   LABOPIA NONE DETECTED 09/27/2012 1313   COCAINSCRNUR NONE DETECTED 09/27/2012 1313   LABBENZ NONE DETECTED 09/27/2012 1313   AMPHETMU NONE DETECTED 09/27/2012 1313   THCU NONE DETECTED 09/27/2012 1313   LABBARB NONE DETECTED 09/27/2012 1313    Alcohol Level:  Recent Labs  09/27/12 1300  ETH 184*   Urinalysis:  Recent Labs  09/27/12 1312  COLORURINE YELLOW  LABSPEC 1.030  PHURINE 5.5  GLUCOSEU NEGATIVE    HGBUR NEGATIVE  BILIRUBINUR NEGATIVE  KETONESUR NEGATIVE  PROTEINUR NEGATIVE  UROBILINOGEN 0.2  NITRITE NEGATIVE  LEUKOCYTESUR NEGATIVE   Misc. Labs: Alcohol Level    Component Value Date/Time   ETH 184* 09/27/2012 1300       Micro Results: No results found for this or any previous visit (from the past 240 hour(s)). Studies/Results: Dg Chest 2 View  09/28/2012   *RADIOLOGY REPORT*  Clinical Data: Asthma, shortness of breath  CHEST - 2 VIEW  Comparison: 09/27/2012  Findings: The heart and pulmonary vascularity are within normal limits.  The lungs are well-aerated bilaterally without focal infiltrate or sizable effusion.  No acute bony abnormality is noted.  IMPRESSION: No acute abnormalities seen.   Original Report Authenticated By: Alcide Clever, M.D.   Dg Chest Port 1 View  09/27/2012   *RADIOLOGY REPORT*  Clinical Data: Chest pain, shortness of breath.  PORTABLE CHEST - 1 VIEW  Comparison: 09/18/2012  Findings: Patchy subsegmental atelectasis or scarring at both lung bases, stable.  No confluent airspace infiltrate.  Heart size normal.  No effusion.  Regional bones unremarkable.  IMPRESSION:  1.  Stable chest.   Original Report Authenticated By: D. Andria Rhein, MD   Medications: I have reviewed the patient's current medications. Scheduled Meds:  folic acid  1 mg Oral Daily   heparin  5,000 Units Subcutaneous Q8H   levalbuterol  1.25 mg Nebulization Q6H   And   ipratropium  0.5 mg Nebulization Q6H   multivitamin with minerals  1 tablet Oral Daily   pantoprazole  40 mg Oral Q1200   predniSONE  40 mg Oral Q breakfast   sodium chloride  3 mL Intravenous Q12H   thiamine  100 mg Oral Daily   Continuous Infusions:  PRN Meds:.levalbuterol, LORazepam, LORazepam, ondansetron  Assessment/Plan:  # Asthma exacerbation. Possibly triggered by environmental exposure; compliance with bronchodilators is unclear. Patient has improved following initial treatment with IV steroids  and inhaled bronchodilator.  Patient had been drinking heavily the night before this exacerbation similar to his last hospitalization as well as visiting a friend who had a cat. -continue steroids, inhaled bronchodilators -peak flows -asthma education. -patient was counseled to avoid second hand smoke in addition to smoking cessation.  # Alcohol abuse. Etoh level 189 on admission.  Patient endorses drinking 12 beers the night before admission.  Mildly tremulous on exam, no signs or symptoms of overt withdrawal. Social work consulted for alcohol abuse.  Patient received thiamine, folate and multivitamin. -CIWA protocol  #Abdominal pain - Patient had this pain on his last admission as well.  Unclear if pain is due to accessory muscle overuse during acute asthma exacerbation, or if he has GI involvement.  Pancreatitis is a possible GI cause in this patient who denies drinking heavily, but has multiple hospitalizations with elevated ethanol levels.  Alcoholic gastritis vs infectious causes are also possibilities. -Lipase and LFTs added onto this am lab draw.    # Tobacco. Patient denies smoking since his recent hospital discharge. We reinforced to him the importance of abstinence from tobacco and offered nicotine replacement which the patient declined.  #Sinus tachycardia - Resolved, now regular rate and rhythm.  Tachycardia likely 2/2 albuterol use and asthma exacerbation.  S1Q3 noted on EKG is consistent with previous EKG, and likely indicative of transient right heart strain.  PE was considered, but with low clinical suspicion and a negative D-dimer, PE is unlikely. -continue telemetry  #DVT Prophylaxis - -subq heparin.  This is a Psychologist, occupational Note.  The care of the patient was discussed with Dr. Meredith Pel and the assessment and plan formulated with their assistance.  Please see their attached note for official documentation of the daily encounter.   LOS: 1 day   [facility] Provider Link  Found] 09/28/2012, 1:13 PM

## 2012-09-28 NOTE — Progress Notes (Signed)
Subjective:  -Patient looks anxious.  -Has epigastric pain. Has nausea, no vomiting. -SOB and wheezing improved. - No fever or chills. No chest pain  Objective: Vital signs in last 24 hours: Filed Vitals:   09/28/12 0556 09/28/12 1000 09/28/12 1512 09/28/12 1530  BP: 114/76 113/72    Pulse: 78 68    Temp:  98.5 F (36.9 C)    TempSrc:  Oral    Resp:  20    Height:      Weight:      SpO2:  96% 96% 94%   Weight change:   Intake/Output Summary (Last 24 hours) at 09/28/12 1649 Last data filed at 09/28/12 1400  Gross per 24 hour  Intake   1253 ml  Output   2780 ml  Net  -1527 ml   Physical Exam:   Filed Vitals:   09/28/12 1000 09/28/12 1512 09/28/12 1530 09/28/12 1739  BP: 113/72   116/68  Pulse: 68   90  Temp: 98.5 F (36.9 C)   97.9 F (36.6 C)  TempSrc: Oral   Oral  Resp: 20   18  Height:      Weight:      SpO2: 96% 96% 94% 99%    General: Not in acute distress HEENT: PERRL, EOMI, no scleral icterus, No JVD or bruit Cardiac: S1/S2, RRR, No murmurs, gallops or rubs Pulm: slightly decreased air movement bilaterally. Clear to auscultation bilaterally. No rales, wheezing, rhonchi or rubs. Abd: Soft, nondistended, mild tenderness over the epigastric area, no rebound pain, no organomegaly, BS present Ext: No edema. 2+DP/PT pulse bilaterally Musculoskeletal: No joint deformities, erythema, or stiffness, ROM full Skin: No rashes.  Neuro: Alert and oriented X3, cranial nerves II-XII grossly intact, muscle strength 5/5 in all extremeties, sensation to light touch intact. Brachial reflex 2+ bilaterally. Knee reflex 1+ bilaterally. Negative Babinski's sign. Normal finger to nose test. Psych: Patient is not psychotic, no suicidal or hemocidal ideation.  Lab Results: Basic Metabolic Panel:  Recent Labs Lab 09/27/12 1300 09/28/12 0445  NA 145 139  K 3.7 3.9  CL 107 106  CO2 24 22  GLUCOSE 153* 144*  BUN 18 16  CREATININE 0.91 0.62  CALCIUM 8.6 9.0   Liver  Function Tests:  Recent Labs Lab 09/27/12 1300  AST 15  ALT 22  ALKPHOS 106  BILITOT 0.2*  PROT 6.8  ALBUMIN 3.6   No results found for this basename: LIPASE, AMYLASE,  in the last 168 hours No results found for this basename: AMMONIA,  in the last 168 hours CBC:  Recent Labs Lab 09/27/12 1300 09/28/12 0445  WBC 6.0 7.9  NEUTROABS 2.8  --   HGB 16.0 14.4  HCT 46.6 40.4  MCV 90.3 89.6  PLT 202 176   Cardiac Enzymes: No results found for this basename: CKTOTAL, CKMB, CKMBINDEX, TROPONINI,  in the last 168 hours BNP: No results found for this basename: PROBNP,  in the last 168 hours D-Dimer:   Recent Labs Lab 09/27/12 1500  DDIMER <0.27   CBG: No results found for this basename: GLUCAP,  in the last 168 hours Hemoglobin A1C: No results found for this basename: HGBA1C,  in the last 168 hours Fasting Lipid Panel: No results found for this basename: CHOL, HDL, LDLCALC, TRIG, CHOLHDL, LDLDIRECT,  in the last 168 hours Thyroid Function Tests: No results found for this basename: TSH, T4TOTAL, FREET4, T3FREE, THYROIDAB,  in the last 168 hours Coagulation: No results found for this basename: LABPROT, INR,  in the last 168 hours Anemia Panel: No results found for this basename: VITAMINB12, FOLATE, FERRITIN, TIBC, IRON, RETICCTPCT,  in the last 168 hours Urine Drug Screen: Drugs of Abuse     Component Value Date/Time   LABOPIA NONE DETECTED 09/27/2012 1313   COCAINSCRNUR NONE DETECTED 09/27/2012 1313   LABBENZ NONE DETECTED 09/27/2012 1313   AMPHETMU NONE DETECTED 09/27/2012 1313   THCU NONE DETECTED 09/27/2012 1313   LABBARB NONE DETECTED 09/27/2012 1313    Alcohol Level:  Recent Labs Lab 09/27/12 1300  ETH 184*   Urinalysis:  Recent Labs Lab 09/27/12 1312  COLORURINE YELLOW  LABSPEC 1.030  PHURINE 5.5  GLUCOSEU NEGATIVE  HGBUR NEGATIVE  BILIRUBINUR NEGATIVE  KETONESUR NEGATIVE  PROTEINUR NEGATIVE  UROBILINOGEN 0.2  NITRITE NEGATIVE  LEUKOCYTESUR  NEGATIVE   Misc. Labs:  Micro Results: No results found for this or any previous visit (from the past 240 hour(s)). Studies/Results: Dg Chest 2 View  09/28/2012   *RADIOLOGY REPORT*  Clinical Data: Asthma, shortness of breath  CHEST - 2 VIEW  Comparison: 09/27/2012  Findings: The heart and pulmonary vascularity are within normal limits.  The lungs are well-aerated bilaterally without focal infiltrate or sizable effusion.  No acute bony abnormality is noted.  IMPRESSION: No acute abnormalities seen.   Original Report Authenticated By: Alcide Clever, M.D.   Dg Chest Port 1 View  09/27/2012   *RADIOLOGY REPORT*  Clinical Data: Chest pain, shortness of breath.  PORTABLE CHEST - 1 VIEW  Comparison: 09/18/2012  Findings: Patchy subsegmental atelectasis or scarring at both lung bases, stable.  No confluent airspace infiltrate.  Heart size normal.  No effusion.  Regional bones unremarkable.  IMPRESSION:  1.  Stable chest.   Original Report Authenticated By: D. Andria Rhein, MD   Medications:  Scheduled Meds: . folic acid  1 mg Oral Daily  . heparin  5,000 Units Subcutaneous Q8H  . levalbuterol  1.25 mg Nebulization Q6H  . multivitamin with minerals  1 tablet Oral Daily  . pantoprazole  40 mg Oral Q1200  . predniSONE  40 mg Oral Q breakfast  . sodium chloride  3 mL Intravenous Q12H  . thiamine  100 mg Oral Daily   Continuous Infusions:  PRN Meds:.levalbuterol, LORazepam, LORazepam, ondansetron Assessment/Plan:  # Asthma Exacerbation - The patient presents with asthma exacerbation, likely due to non-complaint to medication and cigarette smoking. Symptoms have significantly improved after solumedrol 125 mg by EMS, nebulizer treatments, and Magnesium.  -start Levalbuterol nebs q4hrs, as well as q2hrs prn (due to tachycardia) -pt received solumedrol in ED, will transition to prednisone 60 mg daily and tape down every 3 days.  -encourage smoking cessation -will prescribe albuterol inhaler at  discharge  # AG acidosis - resolved. Initial AG=15, repeated BMP showed AG=11 in AM. The etiology of his current anion gap is most likely alcoholic ketoacidosis.  - will follow up BMP  # Sinus tachycardia - noted on EKG, likely due to a combination of alcohol use, albuterol usage, asthma exacerbation, and anxiety.  Pt notes occasional palpitations. No chest pain.  -will observe on telemetry overnight  # Alcohol use - pt has had several prior encounters for intoxication.  Notes drinking 12 beers on the night prior to admission. -CIWA protocol  # Tobacco abuse - patient recently started smoking 4 months ago -nicotine patch prn -encouraged cessation, patient in agreement  # Hyperglycemia/Glucosuria - Patient has random BG of 213 and 500 mg/dL of glucose in urine.  A1c 5.7 on  09/19/12.  - will monitor by BMP  #: Abdominal pain: tenderness over the epigastric area. Nausea, no vomiting. 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  - Protonix 40 mg qd - Zofran for nausea  # Prophy - lovenox  Dispo: Disposition is deferred at this time, awaiting improvement of current medical problems. Anticipated discharge in approximately 1 day(s).       LOS: 1 day   Lorretta Harp 09/28/2012, 4:49 PM

## 2012-09-29 DIAGNOSIS — F17200 Nicotine dependence, unspecified, uncomplicated: Secondary | ICD-10-CM

## 2012-09-29 DIAGNOSIS — F1010 Alcohol abuse, uncomplicated: Secondary | ICD-10-CM

## 2012-09-29 DIAGNOSIS — J45901 Unspecified asthma with (acute) exacerbation: Principal | ICD-10-CM

## 2012-09-29 DIAGNOSIS — R780 Finding of alcohol in blood: Secondary | ICD-10-CM

## 2012-09-29 LAB — BASIC METABOLIC PANEL
BUN: 15 mg/dL (ref 6–23)
Calcium: 9.1 mg/dL (ref 8.4–10.5)
Creatinine, Ser: 0.68 mg/dL (ref 0.50–1.35)
GFR calc non Af Amer: >90 mL/min (ref >90)
Glucose, Bld: 145 mg/dL — ABNORMAL HIGH (ref 70–99)
Potassium: 4 mEq/L (ref 3.5–5.1)

## 2012-09-29 LAB — CBC
Hemoglobin: 15.2 g/dL (ref 13.0–17.0)
MCH: 32.1 pg (ref 26.0–34.0)
MCHC: 35.8 g/dL (ref 30.0–36.0)
RDW: 13 % (ref 11.5–15.5)

## 2012-09-29 MED ORDER — THIAMINE HCL 100 MG PO TABS: 100.0000 mg | ORAL_TABLET | Freq: Every day | ORAL | Status: DC

## 2012-09-29 MED ORDER — PANTOPRAZOLE SODIUM 40 MG PO TBEC: 40.0000 mg | DELAYED_RELEASE_TABLET | Freq: Every day | ORAL | Status: DC

## 2012-09-29 MED ORDER — PREDNISONE 10 MG PO TABS: ORAL_TABLET | ORAL | Status: DC

## 2012-09-29 MED ORDER — FOLIC ACID 1 MG PO TABS: 1.0000 mg | ORAL_TABLET | Freq: Every day | ORAL | Status: DC

## 2012-09-29 NOTE — Progress Notes (Signed)
Subjective:  -Patient feels much better, near his baseline. Ready to go home.  -Peak flow 400-->550  -Has epigastric pain improved -SOB and wheezing resolved. - No fever or chills. No chest pain  Objective: Vital signs in last 24 hours: Filed Vitals:   09/28/12 2133 09/29/12 0139 09/29/12 0618 09/29/12 0947  BP: 120/76  122/79 137/81  Pulse: 71  58 80  Temp: 98.3 F (36.8 C)  98.3 F (36.8 C) 98 F (36.7 C)  TempSrc: Oral  Oral Oral  Resp: 18  18 20   Height: 5\' 5"  (1.651 m)     Weight: 155 lb 10.3 oz (70.6 kg)     SpO2: 94% 95% 97% 94%   Weight change: -4 lb 5.7 oz (-1.975 kg)  Intake/Output Summary (Last 24 hours) at 09/29/12 1007 Last data filed at 09/29/12 0900  Gross per 24 hour  Intake   1590 ml  Output   1900 ml  Net   -310 ml   Physical Exam:   Filed Vitals:   09/28/12 2133 09/29/12 0139 09/29/12 0618 09/29/12 0947  BP: 120/76  122/79 137/81  Pulse: 71  58 80  Temp: 98.3 F (36.8 C)  98.3 F (36.8 C) 98 F (36.7 C)  TempSrc: Oral  Oral Oral  Resp: 18  18 20   Height: 5\' 5"  (1.651 m)     Weight: 155 lb 10.3 oz (70.6 kg)     SpO2: 94% 95% 97% 94%    General: Not in acute distress HEENT: PERRL, EOMI, no scleral icterus, No JVD or bruit Cardiac: S1/S2, RRR, No murmurs, gallops or rubs Pulm: slightly decreased air movement bilaterally. Clear to auscultation bilaterally. No rales, wheezing, rhonchi or rubs. Abd: Soft, nondistended, mild tenderness over the epigastric area, no rebound pain, no organomegaly, BS present Ext: No edema. 2+DP/PT pulse bilaterally Musculoskeletal: No joint deformities, erythema, or stiffness, ROM full Skin: No rashes.  Neuro: Alert and oriented X3, cranial nerves II-XII grossly intact, muscle strength 5/5 in all extremeties, sensation to light touch intact. Brachial reflex 2+ bilaterally. Knee reflex 1+ bilaterally. Negative Babinski's sign. Normal finger to nose test. Psych: Patient is not psychotic, no suicidal or hemocidal  ideation.  Lab Results: Basic Metabolic Panel:  Recent Labs Lab 09/28/12 0445 09/29/12 0510  NA 139 141  K 3.9 4.0  CL 106 106  CO2 22 24  GLUCOSE 144* 145*  BUN 16 15  CREATININE 0.62 0.68  CALCIUM 9.0 9.1   Liver Function Tests:  Recent Labs Lab 09/27/12 1300 09/28/12 1530  AST 15 17  ALT 22 20  ALKPHOS 106 89  BILITOT 0.2* 0.6  PROT 6.8 7.1  ALBUMIN 3.6 3.8    Recent Labs Lab 09/28/12 1530  LIPASE 40   No results found for this basename: AMMONIA,  in the last 168 hours CBC:  Recent Labs Lab 09/27/12 1300 09/28/12 0445 09/29/12 0510  WBC 6.0 7.9 6.9  NEUTROABS 2.8  --   --   HGB 16.0 14.4 15.2  HCT 46.6 40.4 42.5  MCV 90.3 89.6 89.9  PLT 202 176 170   Cardiac Enzymes: No results found for this basename: CKTOTAL, CKMB, CKMBINDEX, TROPONINI,  in the last 168 hours BNP: No results found for this basename: PROBNP,  in the last 168 hours D-Dimer:   Recent Labs Lab 09/27/12 1500  DDIMER <0.27   CBG: No results found for this basename: GLUCAP,  in the last 168 hours Hemoglobin A1C: No results found for this basename: HGBA1C,  in the last 168 hours Fasting Lipid Panel: No results found for this basename: CHOL, HDL, LDLCALC, TRIG, CHOLHDL, LDLDIRECT,  in the last 168 hours Thyroid Function Tests: No results found for this basename: TSH, T4TOTAL, FREET4, T3FREE, THYROIDAB,  in the last 168 hours Coagulation: No results found for this basename: LABPROT, INR,  in the last 168 hours Anemia Panel: No results found for this basename: VITAMINB12, FOLATE, FERRITIN, TIBC, IRON, RETICCTPCT,  in the last 168 hours Urine Drug Screen: Drugs of Abuse     Component Value Date/Time   LABOPIA NONE DETECTED 09/27/2012 1313   COCAINSCRNUR NONE DETECTED 09/27/2012 1313   LABBENZ NONE DETECTED 09/27/2012 1313   AMPHETMU NONE DETECTED 09/27/2012 1313   THCU NONE DETECTED 09/27/2012 1313   LABBARB NONE DETECTED 09/27/2012 1313    Alcohol Level:  Recent Labs Lab  09/27/12 1300  ETH 184*   Urinalysis:  Recent Labs Lab 09/27/12 1312  COLORURINE YELLOW  LABSPEC 1.030  PHURINE 5.5  GLUCOSEU NEGATIVE  HGBUR NEGATIVE  BILIRUBINUR NEGATIVE  KETONESUR NEGATIVE  PROTEINUR NEGATIVE  UROBILINOGEN 0.2  NITRITE NEGATIVE  LEUKOCYTESUR NEGATIVE   Misc. Labs:  Micro Results: No results found for this or any previous visit (from the past 240 hour(s)). Studies/Results: Dg Chest 2 View  09/28/2012   *RADIOLOGY REPORT*  Clinical Data: Asthma, shortness of breath  CHEST - 2 VIEW  Comparison: 09/27/2012  Findings: The heart and pulmonary vascularity are within normal limits.  The lungs are well-aerated bilaterally without focal infiltrate or sizable effusion.  No acute bony abnormality is noted.  IMPRESSION: No acute abnormalities seen.   Original Report Authenticated By: Alcide Clever, M.D.   Dg Chest Port 1 View  09/27/2012   *RADIOLOGY REPORT*  Clinical Data: Chest pain, shortness of breath.  PORTABLE CHEST - 1 VIEW  Comparison: 09/18/2012  Findings: Patchy subsegmental atelectasis or scarring at both lung bases, stable.  No confluent airspace infiltrate.  Heart size normal.  No effusion.  Regional bones unremarkable.  IMPRESSION:  1.  Stable chest.   Original Report Authenticated By: D. Andria Rhein, MD   Medications:  Scheduled Meds: . folic acid  1 mg Oral Daily  . heparin  5,000 Units Subcutaneous Q8H  . levalbuterol  1.25 mg Nebulization Q6H  . multivitamin with minerals  1 tablet Oral Daily  . pantoprazole  40 mg Oral Q1200  . predniSONE  60 mg Oral Q breakfast  . sodium chloride  3 mL Intravenous Q12H  . thiamine  100 mg Oral Daily   Continuous Infusions:  PRN Meds:.levalbuterol, LORazepam, LORazepam Assessment/Plan:  # Asthma Exacerbation - improved significantly.  The patient presents with asthma exacerbation, likely due to non-complaint to medication and cigarette smoking. Symptoms have significantly improved after solumedrol 125 mg by EMS,  nebulizer treatments, and Magnesium. pateint has been treated with steroid and nebulizers. His SOB and wheezing resolved.   -will d/c home and follow up in clinic. -encourage smoking cessation -will taper down prednisone at discharge.  # AG acidosis - resolved. Initial AG=15, repeated BMP showed AG=11 in AM. The etiology of his current anion gap is most likely alcoholic ketoacidosis.  # Sinus tachycardia - resolved. It was likely due to a combination of alcohol use, albuterol usage, asthma exacerbation, and anxiety.  No chest pain.   # Alcohol use - pt has had several prior encounters for intoxication.  Notes drinking 12 beers on the night prior to admission. Treated with CIWA protocol in hospital. He dose not  have withdraw symptoms at discharge.  # Tobacco abuse - patient recently started smoking 4 months ago, treated with nicotine patch prn. Encouraged cessation, patient in agreement.  #: Abdominal pain: tenderness over the epigastric area. Nausea, no vomiting. 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 Will conitnue Protonix 40 mg qd at discharge.    Dispo:  D/C home today  LOS: 2 days   Lorretta Harp 09/29/2012, 10:07 AM

## 2012-09-29 NOTE — Progress Notes (Signed)
Jacob Gaines is a 47 y.o. gentleman with a history of asthma and several emergency department visits for alcohol related injuries who presented to the emergency department 8/24 complaining of an asthma attack after drinking heavily the night before and visiting a friend who had a cat.   Subjective: -Patient complains of green urine yesterday (nurse who emptied urinal into toilet denies any such finding and reports normal urine yellow in color). -No SOB or wheezing. -Endorses willingness to see outpatient clinic for follow up (after hospitalization last week he had declined an offer of a follow up appointment with the [facility]). -This morning patient denies smoking over the past week since his last hospitalization, in contradiction to the information given to Jacob Gaines on admission. -Patient denies any visual disturbances or hallucinations.   Objective: Vital signs in last 24 hours: Filed Vitals:   09/28/12 2133 09/29/12 0139 09/29/12 0618 09/29/12 0947  BP: 120/76  122/79 137/81  Pulse: 71  58 80  Temp: 98.3 F (36.8 C)  98.3 F (36.8 C) 98 F (36.7 C)  TempSrc: Oral  Oral Oral  Resp: 18  18 20   Height: 5\' 5"  (1.651 m)     Weight: 70.6 kg (155 lb 10.3 oz)     SpO2: 94% 95% 97% 94%   Weight change: -1.975 kg (-4 lb 5.7 oz)  Intake/Output Summary (Last 24 hours) at 09/29/12 1030 Last data filed at 09/29/12 0900  Gross per 24 hour  Intake   1590 ml  Output   1900 ml  Net   -310 ml   General: alert, cooperative, anxious HEENT: PERRL Neck: supple Lungs: clear to ascultation bilaterally, normal work of respiration, no wheezes, rales, ronchi.  Peak flow 550 at bedside on exam after breathing treatment this morning. Heart: regular rate and rhythm, no murmurs, rubs or gallops, tachycardia has resolved. Abdomen: soft, bowel sounds present, no tenderness to palpation Extremities: no cyanosis, clubbing, or edema Neurologic: alert & oriented X3, strength grossly intact, Anxious  appearing, though no tremors seen yesterday.  Lab Results: Basic Metabolic Panel:  Recent Labs  14/78/29 0445 09/29/12 0510  NA 139 141  K 3.9 4.0  CL 106 106  CO2 22 24  GLUCOSE 144* 145*  BUN 16 15  CREATININE 0.62 0.68  CALCIUM 9.0 9.1   Liver Function Tests:  Recent Labs  09/27/12 1300 09/28/12 1530  AST 15 17  ALT 22 20  ALKPHOS 106 89  BILITOT 0.2* 0.6  PROT 6.8 7.1  ALBUMIN 3.6 3.8    Recent Labs  09/28/12 1530  LIPASE 40   No results found for this basename: AMMONIA,  in the last 72 hours CBC:  Recent Labs  09/27/12 1300 09/28/12 0445 09/29/12 0510  WBC 6.0 7.9 6.9  NEUTROABS 2.8  --   --   HGB 16.0 14.4 15.2  HCT 46.6 40.4 42.5  MCV 90.3 89.6 89.9  PLT 202 176 170   Cardiac Enzymes: No results found for this basename: CKTOTAL, CKMB, CKMBINDEX, TROPONINI,  in the last 72 hours BNP: No results found for this basename: PROBNP,  in the last 72 hours D-Dimer:  Recent Labs  09/27/12 1500  DDIMER <0.27   CBG: No results found for this basename: GLUCAP,  in the last 72 hours Hemoglobin A1C: No results found for this basename: HGBA1C,  in the last 72 hours Fasting Lipid Panel: No results found for this basename: CHOL, HDL, LDLCALC, TRIG, CHOLHDL, LDLDIRECT,  in the last 72 hours Thyroid  Function Tests: No results found for this basename: TSH, T4TOTAL, FREET4, T3FREE, THYROIDAB,  in the last 72 hours Anemia Panel: No results found for this basename: VITAMINB12, FOLATE, FERRITIN, TIBC, IRON, RETICCTPCT,  in the last 72 hours Coagulation: No results found for this basename: LABPROT, INR,  in the last 72 hours Urine Drug Screen: Drugs of Abuse     Component Value Date/Time   LABOPIA NONE DETECTED 09/27/2012 1313   COCAINSCRNUR NONE DETECTED 09/27/2012 1313   LABBENZ NONE DETECTED 09/27/2012 1313   AMPHETMU NONE DETECTED 09/27/2012 1313   THCU NONE DETECTED 09/27/2012 1313   LABBARB NONE DETECTED 09/27/2012 1313    Alcohol Level:  Recent  Labs  09/27/12 1300  ETH 184*   Urinalysis:  Recent Labs  09/27/12 1312  COLORURINE YELLOW  LABSPEC 1.030  PHURINE 5.5  GLUCOSEU NEGATIVE  HGBUR NEGATIVE  BILIRUBINUR NEGATIVE  KETONESUR NEGATIVE  PROTEINUR NEGATIVE  UROBILINOGEN 0.2  NITRITE NEGATIVE  LEUKOCYTESUR NEGATIVE   Misc. Labs: Alcohol Level    Component Value Date/Time   ETH 184* 09/27/2012 1300       Micro Results: No results found for this or any previous visit (from the past 240 hour(s)). Studies/Results: Dg Chest 2 View  09/28/2012   *RADIOLOGY REPORT*  Clinical Data: Asthma, shortness of breath  CHEST - 2 VIEW  Comparison: 09/27/2012  Findings: The heart and pulmonary vascularity are within normal limits.  The lungs are well-aerated bilaterally without focal infiltrate or sizable effusion.  No acute bony abnormality is noted.  IMPRESSION: No acute abnormalities seen.   Original Report Authenticated By: Alcide Clever, M.D.   Dg Chest Port 1 View  09/27/2012   *RADIOLOGY REPORT*  Clinical Data: Chest pain, shortness of breath.  PORTABLE CHEST - 1 VIEW  Comparison: 09/18/2012  Findings: Patchy subsegmental atelectasis or scarring at both lung bases, stable.  No confluent airspace infiltrate.  Heart size normal.  No effusion.  Regional bones unremarkable.  IMPRESSION:  1.  Stable chest.   Original Report Authenticated By: D. Andria Rhein, MD   Medications: I have reviewed the patient's current medications. Scheduled Meds:  folic acid  1 mg Oral Daily   heparin  5,000 Units Subcutaneous Q8H   levalbuterol  1.25 mg Nebulization Q6H   multivitamin with minerals  1 tablet Oral Daily   pantoprazole  40 mg Oral Q1200   predniSONE  60 mg Oral Q breakfast   sodium chloride  3 mL Intravenous Q12H   thiamine  100 mg Oral Daily   Continuous Infusions:  PRN Meds:.levalbuterol, LORazepam, LORazepam  Assessment/Plan:  # Asthma exacerbation. Possibly triggered by environmental exposure; compliance with  bronchodilators is unclear. Patient has improved following initial treatment with IV steroids and inhaled bronchodilator.  Patient had been drinking heavily the night before this exacerbation similar to his last hospitalization as well as visiting a friend who had a cat.  Importance of adherence to inhaled medications as well as prednisone taper was impressed on patient this morning.  He voiced understanding of the prescribed prednisone taper and with some teaching was able to correctly identify what dose of prednisone he will take on which day. -continue albuterol and advair as outpatient. -Patient started on 12 day prednisone taper (60, 40, 20, 10mg  for 3 days each) begun yesterday.  # Alcohol abuse. Etoh level 189 on admission.  Patient endorses drinking 12 beers the night before admission.  Mildly tremulous on exam, no signs or symptoms of overt withdrawal.  Patient received thiamine,  folate and multivitamin.  It is unclear if the patient had some mild alcoholic hallucinations yesterday or not.  He reported green urine, but seemed unsurprised when the nurse told us that it was not green.  He confirmed that it had indeed appeared green to him and was also complaining of some vague abdominal sensations.  Patient was counseled on alcohol cessation, however he remains precontemplative relative to reduction of his ethanol intake. -CIWA protocol  #Abdominal pain - Patient had this pain on his last admission as well.  Unclear if pain is due to accessory muscle overuse during acute asthma exacerbation, or if he has GI involvement.  Pancreatitis is a possible GI cause in this patient who denies drinking heavily, but has multiple hospitalizations with elevated ethanol levels.  Alcoholic gastritis vs infectious causes are also possibilities.  Lipase and LFTs normal, consistent with labs drawn during last weeks admission.  Abdominal pain has resolved this am.  # Tobacco. Patient denies smoking since his recent  hospital discharge. We reinforced to him the importance of abstinence from tobacco and offered nicotine replacement which the patient declined.  Patient was also counseled on the contribution of second hand smoke to asthma exacerbations relative to his new roommate who is a heavy smoker.  #Sinus tachycardia - Resolved, now regular rate and rhythm.  Tachycardia likely 2/2 albuterol use and asthma exacerbation.  S1Q3 noted on EKG is consistent with previous EKG, and likely indicative of transient right heart strain.  PE was considered, but with low clinical suspicion and a negative D-dimer, PE is unlikely.    #DVT Prophylaxis - -subq heparin.  #Dispo - Patient discharged to home this am.  This is a Psychologist, occupational Note.  The care of the patient was discussed with Dr. Meredith Pel and the assessment and plan formulated with their assistance.  Please see their attached note for official documentation of the daily encounter.   LOS: 2 days   [facility] Provider Link Found] 09/29/2012, 10:30 AM

## 2012-09-29 NOTE — Discharge Summary (Signed)
Patient Name:  Jacob Gaines  MRN: [id number]  PCP: No PCP Per Patient  DOB:  12-12-1965       Date of Admission:  09/27/2012  Date of Discharge:  09/29/2012      Attending Physician: Dr. Farley Ly, MD       DISCHARGE DIAGNOSES:   Asthma exacerbation   Alcohol abuse   Smoking   Abdominal pain   DISPOSITION AND FOLLOW-UP: Jacob Gaines is to follow-up with the listed providers as detailed below, at patient's visiting, please address following issues:  1. Please make sure he is taking his medications for asthma 2. Continue to educate patient the importance of quitting drinking alcohol.  Follow-up Information   Follow up with Farley Ly, MD On 10/06/2012. (Please see Dr. Meredith Pel on September 2nd at 2:15 pm for your hospital follow up appointment.)    Specialty:  Internal Medicine   Contact information:   [address] [place] [address] [phone]       Future Appointments Provider Department Dept Phone   10/06/2012 2:15 PM Lars Masson, MD [facility] [phone]     DISCHARGE MEDICATIONS:   Medication List         albuterol 108 (90 BASE) MCG/ACT inhaler  Commonly known as:  PROVENTIL HFA;VENTOLIN HFA  Inhale 2 puffs into the lungs every 6 (six) hours as needed for wheezing.     Fluticasone-Salmeterol 250-50 MCG/DOSE Aepb  Commonly known as:  ADVAIR DISKUS  Inhale 2 puffs into the lungs 2 (two) times daily.     folic acid 1 MG tablet  Commonly known as:  FOLVITE  Take 1 tablet (1 mg total) by mouth daily.     pantoprazole 40 MG tablet  Commonly known as:  PROTONIX  Take 1 tablet (40 mg total) by mouth daily at 12 noon.     predniSONE 10 MG tablet  Commonly known as:  DELTASONE  - Please be careful to take these as instructed.  - 60 mg (6 tablets) once daily on 8/26 and 8/27  - 40 mg (4 tablets) once daily on 8/28, 8/29 and 8/30.  - 20 mg (2 tablets) once daily on 8/31, 9/01, and 9/02  - 10 mg (1  tablet) once daily on 9/03, 9/04, and 9/05     thiamine 100 MG tablet  Take 1 tablet (100 mg total) by mouth daily.        CONSULTS: none    PROCEDURES PERFORMED:  Dg Chest 2 View  09/28/2012   *RADIOLOGY REPORT*  Clinical Data: Asthma, shortness of breath  CHEST - 2 VIEW  Comparison: 09/27/2012  Findings: The heart and pulmonary vascularity are within normal limits.  The lungs are well-aerated bilaterally without focal infiltrate or sizable effusion.  No acute bony abnormality is noted.  IMPRESSION: No acute abnormalities seen.   Original Report Authenticated By: Alcide Clever, M.D.   Dg Chest Port 1 View  09/27/2012   *RADIOLOGY REPORT*  Clinical Data: Chest pain, shortness of breath.  PORTABLE CHEST - 1 VIEW  Comparison: 09/18/2012  Findings: Patchy subsegmental atelectasis or scarring at both lung bases, stable.  No confluent airspace infiltrate.  Heart size normal.  No effusion.  Regional bones unremarkable.  IMPRESSION:  1.  Stable chest.   Original Report Authenticated By: D. Andria Rhein, MD   Dg Chest Portable 1 View  09/18/2012   *RADIOLOGY REPORT*  Clinical Data: Respiratory distress and asthma.  PORTABLE CHEST -  1 VIEW  Comparison: 02/23/2012  Findings: Single view of the chest was obtained.  Mild elevation of the right hemidiaphragm appears unchanged.  Stable appearance of the heart and mediastinum.  The trachea is midline.  No focal lung disease and no evidence for pulmonary edema.  IMPRESSION: No acute cardiopulmonary disease.   Original Report Authenticated By: Richarda Overlie, M.D.      ADMISSION DATA:  History of Present Illness:   Jacob Gaines is a 47 y.o. man with PMH asthma, alcohol abuse who presents as a transfer from [facility] complaining of SOB, wheezing and chest tightness.  The patient was in his usual state of health until he awoke this morning from sleep with a non-productive cough, dyspnea, and wheezing. As the day wore on he also developed chest "tightness".  He also found it hard to speak in full sentences at that time, so decided to come to the ED. He did not use his albuterol rescue inhaler because he says he was told "not to use it too much because of the steroids." The patient notes no fevers, chills, nausea, vomiting. He states his symptoms feel similar to his prior asthma exacerbations. His triggers are usually environment/allergic in nature. He thinks the reason for his current attack was a house cat he encountered during a visit to a friend's house.  He reports a long history of asthma since childhood, which appears to have been relatively well controlled until he ran out of his medications 3 months ago. Since then, he admits he has been managing his asthma via trips to the emergency room. Adding to his poor control is the fact that he started smoking cigarettes 6 months ago, now up to 1 PPD. Some of his asthma attacks have required steroids and hospitalization; most recently he was admitted to our service on 09/18/12. He has never been intubated.  In the ED he was given IV solumedrol and Duonebs. He was also found to have an EtOH level of 184. He states that his last drink was a 12-pack of beer 2 days ago. He drinks this amount every other day. He denies a history of alcohol withdrawal or seizures.   Physical Exam:  Blood pressure 125/75, pulse 105, temperature 99.1 F (37.3 C), temperature source Oral, resp. rate 20, height 5\' 5"  (1.651 m), weight 155 lb 3.3 oz (70.4 kg), SpO2 97.00%.  Constitutional: He is oriented to person, place, and time and well-developed, well-nourished, and in no distress. No distress.  HENT:   Head: Normocephalic and atraumatic.   Mouth/Throat: Oropharynx is clear and moist. No oropharyngeal exudate.  Eyes: Conjunctivae and EOM are normal. Pupils are equal, round, and reactive to light.  Neck: Normal range of motion. Neck supple.  Cardiovascular: Regular rhythm, normal heart sounds and intact distal pulses.   Tachycardia present.  Exam reveals no gallop and no friction rub.    No murmur heard. Pulmonary/Chest: Effort normal and breath sounds normal. No respiratory distress. He has no wheezes. He has no rales. He exhibits no tenderness.  Examined him after a breathing treatment. Moving air well. Normal I:E ratio. No wheezes.  Abdominal: Soft. Bowel sounds are normal. He exhibits no distension. There is no tenderness.  Musculoskeletal: Normal range of motion. He exhibits no edema and no tenderness.  Neurological: He is alert and oriented to person, place, and time. No cranial nerve deficit. GCS score is 15.  Skin: Skin is warm and dry. He is not diaphoretic.  No cyanosis. No clubbing.  Psychiatric: Appears intoxicated. Slowed responses.   Lab results: Basic Metabolic Panel:  Recent Labs   09/27/12 1300   NA  145   K  3.7   CL  107   CO2  24   GLUCOSE  153*   BUN  18   CREATININE  0.91   CALCIUM  8.6    Liver Function Tests:  Recent Labs   09/27/12 1300   AST  15   ALT  22   ALKPHOS  106   BILITOT  0.2*   PROT  6.8   ALBUMIN  3.6    CBC:  Recent Labs   09/27/12 1300   WBC  6.0   NEUTROABS  2.8   HGB  16.0   HCT  46.6   MCV  90.3   PLT  202    D-Dimer:  Recent Labs   09/27/12 1500   DDIMER  <0.27    Urine Drug Screen: Drugs of Abuse      Component  Value  Date/Time     LABOPIA  NONE DETECTED  09/27/2012 1313     COCAINSCRNUR  NONE DETECTED  09/27/2012 1313     LABBENZ  NONE DETECTED  09/27/2012 1313     AMPHETMU  NONE DETECTED  09/27/2012 1313     THCU  NONE DETECTED  09/27/2012 1313     LABBARB  NONE DETECTED  09/27/2012 1313    Alcohol Level:  Recent Labs   09/27/12 1300   ETH  184*    Urinalysis:  Recent Labs   09/27/12 1312   COLORURINE  YELLOW   LABSPEC  1.030   PHURINE  5.5   GLUCOSEU  NEGATIVE   HGBUR  NEGATIVE   BILIRUBINUR  NEGATIVE   KETONESUR  NEGATIVE   PROTEINUR  NEGATIVE   UROBILINOGEN  0.2   NITRITE  NEGATIVE   LEUKOCYTESUR   NEGATIVE     HOSPITAL COURSE:  1. Asthma Exacerbation - The patient presented with asthma exacerbation again It is likely triggered by exposure to cigarette smoke and cat, which the patient reports has triggered attacks previously. Patient has received salmeterol from ED on 8/24, and was subsequently started on a steroid taper while receiving levalbuterol and ipratropium treatments. Symptoms greatly improved over the course of HD#1, and patient had no respiratory complaints at discharge. Peak flow improved from 400 to 550 L/M before discharge. The patient will complete 12 day course of steroid taper given his refractory symptoms which occurred while he was still taking his last taper. He will continue home albuterol prn and Advair two puffs bid. 12 day prednisone taper (60mg , 40mg , 20mg , 10mg  x 3 days) was begun on hospital day #2. At previous discharge 1 week ago, patient declined to make a hospital follow up, but has verbalized agreement to keep his appointment for this follow up visit scheduled for September 2nd at 2:15 with Dr. Yetta Barre   2. Sinus tachycardia - likely due to a combination of albuterol usage and asthma exacerbation. Tachycardia resolved by hospital day #2. No treatment needed. Will follow up in clinic with Dr. Meredith Pel.  3. Tobacco abuse - patient recently started smoking 6 months ago, has given conflicting stories endorsing use of 1 ppd to some providers and denying any smoking since his last hospitalization 1 week ago to other providers. Patient previously endorsed using 4-5 cigarettes per day on prior admission one week earlier. Patient was counseled about the risks of smoking exacerbating his asthma and reports  that he will not smoke any longer. He was offered pharmacologic treatment for smoking cessation, but does not believe he needs this. Patient will follow up at [facility] for this issue.   4. Alcohol use - Patient presented with ethanol level of 189. He denies using alcohol often, but  this was his 7th visit to the ED this year, all of which he has been found to be acutely intoxicated to >300 at times. He was counseled that his alcohol use is dangerous and unhealthy as evidenced by these multiple injuries and hospitalizations. Patient appears to be in pre-contempative state relative to his alcohol use, and changes subject whenever it is discussed, stating "I don't think I need any help to quit", but not voicing any intention to change his drinking habits. On HD #2 the patient reported green urine, the nurse who emptied his urinal into the toilet reports normal appearing yellow urine. It is possible that this visual disturbance represents some mild alcoholic hallucinations, but the patient had no other perceptual disturbances or overt hallucinations during his hospital stay. Patient was put on CIWA protocol while in the hospital, but had no symptoms of etoh withdrawal other than slight tremor of the hand, which resolved at discharge.  Patient was discharged with prescriptions for folate and thiamine and will follow up with his outpatient appointment with Dr. Yetta Barre.   5. Abdominal pain - On HD#2 the patient complained of diffuse mild abdominal pain. Lipase and LFTs were drawn and found to be normal. 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 Other DD include accessory muscle use 2/2/ asthma exacerbation vs perceptual disturbance secondary to alcohol withdrawal. He was treated with Protonix in hospital.  Abdominal pain resolved by hospital day #3. He was discharged home on Protonix. Patient advised to mention any subsequent abdominal pain at his hospital follow up visit.   DISCHARGE DATA: Vital Signs: BP 137/81  Pulse 80  Temp(Src) 98 F (36.7 C) (Oral)  Resp 20  Ht 5\' 5"  (1.651 m)  Wt 155 lb 10.3 oz (70.6 kg)  BMI 25.9 kg/m2  SpO2 94%  Labs: Results for orders placed during the hospital encounter of 09/27/12 (from the past 24 hour(s))  HEPATIC FUNCTION PANEL     Status: None    Collection Time    09/28/12  3:30 PM      Result Value Range   Total Protein 7.1  6.0 - 8.3 g/dL   Albumin 3.8  3.5 - 5.2 g/dL   AST 17  0 - 37 U/L   ALT 20  0 - 53 U/L   Alkaline Phosphatase 89  39 - 117 U/L   Total Bilirubin 0.6  0.3 - 1.2 mg/dL   Bilirubin, Direct <1.6  0.0 - 0.3 mg/dL   Indirect Bilirubin NOT CALCULATED  0.3 - 0.9 mg/dL  LIPASE, BLOOD     Status: None   Collection Time    09/28/12  3:30 PM      Result Value Range   Lipase 40  11 - 59 U/L  BASIC METABOLIC PANEL     Status: Abnormal   Collection Time    09/29/12  5:10 AM      Result Value Range   Sodium 141  135 - 145 mEq/L   Potassium 4.0  3.5 - 5.1 mEq/L   Chloride 106  96 - 112 mEq/L   CO2 24  19 - 32 mEq/L   Glucose, Bld 145 (*) 70 - 99 mg/dL   BUN 15  6 - 23 mg/dL   Creatinine, Ser 7.84  0.50 - 1.35 mg/dL   Calcium 9.1  8.4 - 69.6 mg/dL   GFR calc non Af Amer >90  >90 mL/min   GFR calc Af Amer >90  >90 mL/min  CBC     Status: None   Collection Time    09/29/12  5:10 AM      Result Value Range   WBC 6.9  4.0 - 10.5 K/uL   RBC 4.73  4.22 - 5.81 MIL/uL   Hemoglobin 15.2  13.0 - 17.0 g/dL   HCT 29.5  28.4 - 13.2 %   MCV 89.9  78.0 - 100.0 fL   MCH 32.1  26.0 - 34.0 pg   MCHC 35.8  30.0 - 36.0 g/dL   RDW 44.0  10.2 - 72.5 %   Platelets 170  150 - 400 K/uL     Services Ordered on Discharge: Y = Yes; Blank = No PT:   OT:   RN:   Equipment:   Other:      Time Spent on Discharge: 35 min   Signed: Lorretta Harp, MD PGY 2, Internal Medicine Resident 09/29/2012, 10:08 AM

## 2012-09-29 NOTE — Progress Notes (Signed)
Patient given DC instructions, reports no question at this time.  Jacqulyn Cane RN, BSN, CCRN

## 2012-09-29 NOTE — Progress Notes (Signed)
I have seen the patient and reviewed the daily progress note by medical student,  Josh and discussed the care of the patient with them. Please see my note for documentation of my findings, assessment, and plans.   Jaishon Krisher, MD PGY3, Internal Medicine Teaching Service Pager: [phone]   

## 2012-09-30 DIAGNOSIS — R1013 Epigastric pain: Secondary | ICD-10-CM

## 2012-10-06 ENCOUNTER — Ambulatory Visit: Payer: Self-pay | Admitting: Internal Medicine

## 2012-11-15 ENCOUNTER — Emergency Department (HOSPITAL_COMMUNITY)
Admission: EM | Admit: 2012-11-15 | Discharge: 2012-11-15 | Disposition: A | Discharge status: Home / Self Care | Payer: Self-pay | Attending: Emergency Medicine | Admitting: Emergency Medicine

## 2012-11-15 ENCOUNTER — Emergency Department (HOSPITAL_COMMUNITY): Payer: Self-pay

## 2012-11-15 ENCOUNTER — Encounter (HOSPITAL_COMMUNITY): Payer: Self-pay | Admitting: Emergency Medicine

## 2012-11-15 DIAGNOSIS — J45901 Unspecified asthma with (acute) exacerbation: Secondary | ICD-10-CM | POA: Insufficient documentation

## 2012-11-15 DIAGNOSIS — Z79899 Other long term (current) drug therapy: Secondary | ICD-10-CM | POA: Insufficient documentation

## 2012-11-15 DIAGNOSIS — R58 Hemorrhage, not elsewhere classified: Secondary | ICD-10-CM

## 2012-11-15 DIAGNOSIS — S298XXA Other specified injuries of thorax, initial encounter: Secondary | ICD-10-CM | POA: Insufficient documentation

## 2012-11-15 DIAGNOSIS — R109 Unspecified abdominal pain: Secondary | ICD-10-CM

## 2012-11-15 DIAGNOSIS — F10129 Alcohol abuse with intoxication, unspecified: Secondary | ICD-10-CM

## 2012-11-15 DIAGNOSIS — F1010 Alcohol abuse, uncomplicated: Secondary | ICD-10-CM | POA: Insufficient documentation

## 2012-11-15 DIAGNOSIS — I998 Other disorder of circulatory system: Secondary | ICD-10-CM | POA: Insufficient documentation

## 2012-11-15 DIAGNOSIS — F17200 Nicotine dependence, unspecified, uncomplicated: Secondary | ICD-10-CM | POA: Insufficient documentation

## 2012-11-15 LAB — URINALYSIS, ROUTINE W REFLEX MICROSCOPIC
Bilirubin Urine: NEGATIVE
Glucose, UA: NEGATIVE mg/dL
Ketones, ur: NEGATIVE mg/dL
pH: 5 (ref 5.0–8.0)

## 2012-11-15 LAB — COMPREHENSIVE METABOLIC PANEL
AST: 15 U/L (ref 0–37)
CO2: 25 mEq/L (ref 19–32)
Calcium: 7.4 mg/dL — ABNORMAL LOW (ref 8.4–10.5)
Creatinine, Ser: 0.89 mg/dL (ref 0.50–1.35)
GFR calc Af Amer: >90 mL/min (ref >90)
GFR calc non Af Amer: >90 mL/min (ref >90)
Glucose, Bld: 105 mg/dL — ABNORMAL HIGH (ref 70–99)

## 2012-11-15 LAB — ABO/RH: ABO/RH(D): O POS

## 2012-11-15 LAB — CBC
MCV: 88.5 fL (ref 78.0–100.0)
Platelets: 181 10*3/uL (ref 150–400)
RDW: 13.4 % (ref 11.5–15.5)
WBC: 4.8 10*3/uL (ref 4.0–10.5)

## 2012-11-15 LAB — PROTIME-INR
INR: 1.02 (ref 0.00–1.49)
Prothrombin Time: 13.2 seconds (ref 11.6–15.2)

## 2012-11-15 LAB — CK TOTAL AND CKMB (NOT AT ARMC)
Relative Index: 1.6 (ref 0.0–2.5)
Total CK: 125 U/L (ref 7–232)

## 2012-11-15 LAB — HEMOGLOBIN AND HEMATOCRIT, BLOOD
HCT: 39.7 % (ref 39.0–52.0)
Hemoglobin: 13.9 g/dL (ref 13.0–17.0)

## 2012-11-15 MED ORDER — MORPHINE SULFATE 4 MG/ML IJ SOLN
4.0000 mg | Freq: Once | INTRAMUSCULAR | Status: AC
  Administered 2012-11-15: 4 mg via INTRAVENOUS
  Filled 2012-11-15: qty 1

## 2012-11-15 MED ORDER — SODIUM CHLORIDE 0.9 % IV BOLUS (SEPSIS)
1000.0000 mL | Freq: Once | INTRAVENOUS | Status: AC
  Administered 2012-11-15: 1000 mL via INTRAVENOUS

## 2012-11-15 MED ORDER — IOHEXOL 300 MG/ML  SOLN
100.0000 mL | Freq: Once | INTRAMUSCULAR | Status: AC | PRN
  Administered 2012-11-15: 100 mL via INTRAVENOUS
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
## 2012-11-15 NOTE — ED Notes (Addendum)
Pt need to wait until he gets sober per Dr. Elesa Massed.

## 2012-11-15 NOTE — ED Notes (Addendum)
Discharge the pt per Dr. Elesa Massed, pt alert and oriented x4. Pt states he is fine to go home.
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
## 2013-01-22 ENCOUNTER — Emergency Department (HOSPITAL_COMMUNITY): Payer: Self-pay

## 2013-01-22 ENCOUNTER — Emergency Department (HOSPITAL_COMMUNITY)
Admission: EM | Admit: 2013-01-22 | Discharge: 2013-01-23 | Disposition: A | Discharge status: Home / Self Care | Payer: Self-pay | Attending: Emergency Medicine | Admitting: Emergency Medicine

## 2013-01-22 DIAGNOSIS — S0990XA Unspecified injury of head, initial encounter: Secondary | ICD-10-CM | POA: Insufficient documentation

## 2013-01-22 DIAGNOSIS — F1010 Alcohol abuse, uncomplicated: Secondary | ICD-10-CM | POA: Insufficient documentation

## 2013-01-22 DIAGNOSIS — F17200 Nicotine dependence, unspecified, uncomplicated: Secondary | ICD-10-CM | POA: Insufficient documentation

## 2013-01-22 DIAGNOSIS — Z8679 Personal history of other diseases of the circulatory system: Secondary | ICD-10-CM | POA: Insufficient documentation

## 2013-01-22 DIAGNOSIS — J45909 Unspecified asthma, uncomplicated: Secondary | ICD-10-CM | POA: Insufficient documentation

## 2013-01-22 DIAGNOSIS — Z79899 Other long term (current) drug therapy: Secondary | ICD-10-CM | POA: Insufficient documentation

## 2013-01-22 DIAGNOSIS — IMO0002 Reserved for concepts with insufficient information to code with codable children: Secondary | ICD-10-CM | POA: Insufficient documentation

## 2013-01-22 DIAGNOSIS — F10929 Alcohol use, unspecified with intoxication, unspecified: Secondary | ICD-10-CM

## 2013-01-22 LAB — CBC WITH DIFFERENTIAL/PLATELET
Basophils Absolute: 0 10*3/uL (ref 0.0–0.1)
Eosinophils Relative: 0 % (ref 0–5)
HCT: 47.2 % (ref 39.0–52.0)
Hemoglobin: 17.1 g/dL — ABNORMAL HIGH (ref 13.0–17.0)
Lymphocytes Relative: 51 % — ABNORMAL HIGH (ref 12–46)
MCHC: 36.2 g/dL — ABNORMAL HIGH (ref 30.0–36.0)
MCV: 89.1 fL (ref 78.0–100.0)
Monocytes Absolute: 0.5 10*3/uL (ref 0.1–1.0)
RDW: 12.3 % (ref 11.5–15.5)
WBC: 6.6 10*3/uL (ref 4.0–10.5)

## 2013-01-22 LAB — BASIC METABOLIC PANEL
CO2: 25 mEq/L (ref 19–32)
Calcium: 9 mg/dL (ref 8.4–10.5)
Creatinine, Ser: 0.86 mg/dL (ref 0.50–1.35)
GFR calc Af Amer: >90 mL/min (ref >90)
GFR calc non Af Amer: >90 mL/min (ref >90)
Glucose, Bld: 124 mg/dL — ABNORMAL HIGH (ref 70–99)

## 2013-01-22 LAB — ETHANOL: Alcohol, Ethyl (B): 295 mg/dL — ABNORMAL HIGH (ref 0–11)

## 2013-01-22 MED ORDER — KETOROLAC TROMETHAMINE 30 MG/ML IJ SOLN: 30.0000 mg | Freq: Once | INTRAMUSCULAR | Status: DC

## 2013-01-22 MED ORDER — SODIUM CHLORIDE 0.9 % IV BOLUS (SEPSIS)
1000.0000 mL | Freq: Once | INTRAVENOUS | Status: AC
  Administered 2013-01-22: 1000 mL via INTRAVENOUS
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
## 2013-01-22 NOTE — ED Notes (Signed)
Pt ambulated with assistance to the restroom so he could urinate.
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
## 2013-01-23 NOTE — ED Provider Notes (Signed)
Patient ambulating without assistance. Symptoms improved. We'll discharge home with head injury precautions.  Loren Racer, MD 01/23/13 [id number]

## 2013-01-23 NOTE — ED Notes (Signed)
Pt was ambulated without assistance to the bathroom.

## 2013-01-25 NOTE — ED Provider Notes (Signed)
Medical screening examination/treatment/procedure(s) were conducted as a shared visit with non-physician practitioner(s) and myself.  I personally evaluated the patient during the encounter.  EKG Interpretation   None       Well appearing. Clinically sober. Dc home  Lyanne Co, MD 01/25/13 2134

## 2013-04-01 ENCOUNTER — Emergency Department (HOSPITAL_COMMUNITY): Payer: Self-pay

## 2013-04-01 ENCOUNTER — Encounter (HOSPITAL_COMMUNITY): Payer: Self-pay | Admitting: Emergency Medicine

## 2013-04-01 ENCOUNTER — Emergency Department (HOSPITAL_COMMUNITY)
Admission: EM | Admit: 2013-04-01 | Discharge: 2013-04-01 | Disposition: A | Discharge status: Home / Self Care | Payer: Self-pay | Attending: Emergency Medicine | Admitting: Emergency Medicine

## 2013-04-01 DIAGNOSIS — R112 Nausea with vomiting, unspecified: Secondary | ICD-10-CM | POA: Insufficient documentation

## 2013-04-01 DIAGNOSIS — F17200 Nicotine dependence, unspecified, uncomplicated: Secondary | ICD-10-CM | POA: Insufficient documentation

## 2013-04-01 DIAGNOSIS — F1010 Alcohol abuse, uncomplicated: Secondary | ICD-10-CM | POA: Insufficient documentation

## 2013-04-01 DIAGNOSIS — J45901 Unspecified asthma with (acute) exacerbation: Secondary | ICD-10-CM | POA: Insufficient documentation

## 2013-04-01 DIAGNOSIS — R1084 Generalized abdominal pain: Secondary | ICD-10-CM | POA: Insufficient documentation

## 2013-04-01 DIAGNOSIS — Z8679 Personal history of other diseases of the circulatory system: Secondary | ICD-10-CM | POA: Insufficient documentation

## 2013-04-01 DIAGNOSIS — R000 Tachycardia, unspecified: Secondary | ICD-10-CM | POA: Insufficient documentation

## 2013-04-01 DIAGNOSIS — R1033 Periumbilical pain: Secondary | ICD-10-CM | POA: Insufficient documentation

## 2013-04-01 DIAGNOSIS — Z79899 Other long term (current) drug therapy: Secondary | ICD-10-CM | POA: Insufficient documentation

## 2013-04-01 DIAGNOSIS — R109 Unspecified abdominal pain: Secondary | ICD-10-CM

## 2013-04-01 LAB — COMPREHENSIVE METABOLIC PANEL
ALK PHOS: 120 U/L — AB (ref 39–117)
ALT: 43 U/L (ref 0–53)
AST: 30 U/L (ref 0–37)
Albumin: 4 g/dL (ref 3.5–5.2)
BILIRUBIN TOTAL: 0.3 mg/dL (ref 0.3–1.2)
BUN: 12 mg/dL (ref 6–23)
CHLORIDE: 105 meq/L (ref 96–112)
CO2: 21 meq/L (ref 19–32)
Calcium: 8.5 mg/dL (ref 8.4–10.5)
Creatinine, Ser: 0.78 mg/dL (ref 0.50–1.35)
GLUCOSE: 132 mg/dL — AB (ref 70–99)
POTASSIUM: 4 meq/L (ref 3.7–5.3)
Sodium: 143 mEq/L (ref 137–147)
TOTAL PROTEIN: 7.2 g/dL (ref 6.0–8.3)

## 2013-04-01 LAB — CBC WITH DIFFERENTIAL/PLATELET
Basophils Absolute: 0 10*3/uL (ref 0.0–0.1)
Basophils Relative: 0 % (ref 0–1)
Eosinophils Absolute: 0 10*3/uL (ref 0.0–0.7)
Eosinophils Relative: 1 % (ref 0–5)
HCT: 43.7 % (ref 39.0–52.0)
HEMOGLOBIN: 15.7 g/dL (ref 13.0–17.0)
LYMPHS ABS: 2.1 10*3/uL (ref 0.7–4.0)
Lymphocytes Relative: 38 % (ref 12–46)
MCH: 31.2 pg (ref 26.0–34.0)
MCHC: 35.9 g/dL (ref 30.0–36.0)
MCV: 86.9 fL (ref 78.0–100.0)
MONOS PCT: 9 % (ref 3–12)
Monocytes Absolute: 0.5 10*3/uL (ref 0.1–1.0)
NEUTROS ABS: 2.8 10*3/uL (ref 1.7–7.7)
Neutrophils Relative %: 52 % (ref 43–77)
Platelets: 180 10*3/uL (ref 150–400)
RBC: 5.03 MIL/uL (ref 4.22–5.81)
RDW: 12.9 % (ref 11.5–15.5)
WBC: 5.4 10*3/uL (ref 4.0–10.5)

## 2013-04-01 LAB — URINALYSIS, ROUTINE W REFLEX MICROSCOPIC
Bilirubin Urine: NEGATIVE
Glucose, UA: NEGATIVE mg/dL
HGB URINE DIPSTICK: NEGATIVE
KETONES UR: NEGATIVE mg/dL
Leukocytes, UA: NEGATIVE
Nitrite: NEGATIVE
Protein, ur: NEGATIVE mg/dL
SPECIFIC GRAVITY, URINE: 1.019 (ref 1.005–1.030)
UROBILINOGEN UA: 0.2 mg/dL (ref 0.0–1.0)
pH: 5 (ref 5.0–8.0)

## 2013-04-01 LAB — LIPASE, BLOOD: Lipase: 33 U/L (ref 11–59)

## 2013-04-01 MED ORDER — HYDROMORPHONE HCL PF 1 MG/ML IJ SOLN
1.0000 mg | Freq: Once | INTRAMUSCULAR | Status: AC
  Administered 2013-04-01: 1 mg via INTRAVENOUS
  Filled 2013-04-01: qty 1

## 2013-04-01 MED ORDER — IOHEXOL 300 MG/ML  SOLN
50.0000 mL | Freq: Once | INTRAMUSCULAR | Status: AC | PRN
  Administered 2013-04-01: 50 mL via ORAL

## 2013-04-01 MED ORDER — IOHEXOL 300 MG/ML  SOLN
100.0000 mL | Freq: Once | INTRAMUSCULAR | Status: AC | PRN
  Administered 2013-04-01: 100 mL via INTRAVENOUS

## 2013-04-01 MED ORDER — SODIUM CHLORIDE 0.9 % IV BOLUS (SEPSIS)
1000.0000 mL | Freq: Once | INTRAVENOUS | Status: AC
  Administered 2013-04-01: 1000 mL via INTRAVENOUS

## 2013-04-01 NOTE — Discharge Instructions (Signed)
Abdominal Pain, Adult  Many things can cause abdominal pain. Usually, abdominal pain is not caused by a disease and will improve without treatment. It can often be observed and treated at home. Your health care provider will do a physical exam and possibly order blood tests and X-rays to help determine the seriousness of your pain. However, in many cases, more time must pass before a clear cause of the pain can be found. Before that point, your health care provider may not know if you need more testing or further treatment.  HOME CARE INSTRUCTIONS   Monitor your abdominal pain for any changes. The following actions may help to alleviate any discomfort you are experiencing:  · Only take over-the-counter or prescription medicines as directed by your health care provider.  · Do not take laxatives unless directed to do so by your health care provider.  · Try a clear liquid diet (broth, tea, or water) as directed by your health care provider. Slowly move to a bland diet as tolerated.  SEEK MEDICAL CARE IF:  · You have unexplained abdominal pain.  · You have abdominal pain associated with nausea or diarrhea.  · You have pain when you urinate or have a bowel movement.  · You experience abdominal pain that wakes you in the night.  · You have abdominal pain that is worsened or improved by eating food.  · You have abdominal pain that is worsened with eating fatty foods.  SEEK IMMEDIATE MEDICAL CARE IF:   · Your pain does not go away within 2 hours.  · You have a fever.  · You keep throwing up (vomiting).  · Your pain is felt only in portions of the abdomen, such as the right side or the left lower portion of the abdomen.  · You pass bloody or black tarry stools.  MAKE SURE YOU:  · Understand these instructions.    · Will watch your condition.    · Will get help right away if you are not doing well or get worse.    Document Released: 10/31/2004 Document Revised: 11/11/2012 Document Reviewed: 09/30/2012  ExitCare® Patient  Information ©2014 [employer].    Alcohol Intoxication  Alcohol intoxication occurs when the amount of alcohol that a person has consumed impairs his or her ability to mentally and physically function. Alcohol directly impairs the normal chemical activity of the brain. Drinking large amounts of alcohol can lead to changes in mental function and behavior, and it can cause many physical effects that can be harmful.   Alcohol intoxication can range in severity from mild to very severe. Various factors can affect the level of intoxication that occurs, such as the person's age, gender, weight, frequency of alcohol consumption, and the presence of other medical conditions (such as diabetes, seizures, or heart conditions). Dangerous levels of alcohol intoxication may occur when people drink large amounts of alcohol in a short period (binge drinking). Alcohol can also be especially dangerous when combined with certain prescription medicines or "recreational" drugs.  SIGNS AND SYMPTOMS  Some common signs and symptoms of mild alcohol intoxication include:  · Loss of coordination.  · Changes in mood and behavior.  · Impaired judgment.  · Slurred speech.  As alcohol intoxication progresses to more severe levels, other signs and symptoms will appear. These may include:  · Vomiting.  · Confusion and impaired memory.  · Slowed breathing.  · Seizures.  · Loss of consciousness.  DIAGNOSIS   Your health care provider will take a medical   history and perform a physical exam. You will be asked about the amount and type of alcohol you have consumed. Blood tests will be done to measure the concentration of alcohol in your blood. In many places, your blood alcohol level must be lower than 80 mg/dL (0.08%) to legally drive. However, many dangerous effects of alcohol can occur at much lower levels.   TREATMENT   People with alcohol intoxication often do not require treatment. Most of the effects of alcohol intoxication are temporary, and  they go away as the alcohol naturally leaves the body. Your health care provider will monitor your condition until you are stable enough to go home. Fluids are sometimes given through an IV access tube to help prevent dehydration.   HOME CARE INSTRUCTIONS  · Do not drive after drinking alcohol.  · Stay hydrated. Drink enough water and fluids to keep your urine clear or pale yellow. Avoid caffeine.    · Only take over-the-counter or prescription medicines as directed by your health care provider.    SEEK MEDICAL CARE IF:   · You have persistent vomiting.    · You do not feel better after a few days.  · You have frequent alcohol intoxication. Your health care provider can help determine if you should see a substance use treatment counselor.  SEEK IMMEDIATE MEDICAL CARE IF:   · You become shaky or tremble when you try to stop drinking.    · You shake uncontrollably (seizure).    · You throw up (vomit) blood. This may be bright red or may look like black coffee grounds.    · You have blood in your stool. This may be bright red or may appear as a black, tarry, bad smelling stool.    · You become lightheaded or faint.    MAKE SURE YOU:   · Understand these instructions.  · Will watch your condition.  · Will get help right away if you are not doing well or get worse.  Document Released: 10/31/2004 Document Revised: 09/23/2012 Document Reviewed: 06/26/2012  ExitCare® Patient Information ©2014 [employer].

## 2013-04-01 NOTE — ED Provider Notes (Signed)
CSN: [id number]     Arrival date & time 04/01/13  1024 History   First MD Initiated Contact with Patient 04/01/13 1105     Chief Complaint  Patient presents with  . Generalized Body Aches     (Consider location/radiation/quality/duration/timing/severity/associated sxs/prior Treatment) HPI Comments: 48 year old male presents with abdominal pain that started this morning. States been having diffuse aches as well as nausea and vomiting. He's been having chills but has not taken his temperature. He states he drank alcohol last night with his brother. When asked how much he said that he and his brother drank an 18 pack. He states his brother is more of an alcoholic that he is. At this time the abdominal pain is mostly below his umbilicus although is having trouble localizing it. He did have a little bit of a cough and some shortness of breath as well. No diarrhea or constipation.   Past Medical History  Diagnosis Date  . ETOH abuse   . Asthma   . Tobacco abuse   . Enlarged heart     "when I was little" (09/18/2012)  . Chest pain     "only related to asthma attack" (09/18/2012)  . Shortness of breath     " related to asthma attack; sometimes in the mornings too" (09/18/2012)  . Sleep apnea     "I don't wear a mask" (09/18/2012)  . Headache(784.0)     "only related to asthma attack" (09/18/2012)   Past Surgical History  Procedure Laterality Date  . Laparoscopic cholecystectomy  2007    Performed in [place]   Family History  Problem Relation Age of Onset  . Heart attack Paternal Grandfather 50  . Colon cancer Paternal Grandmother    History  Substance Use Topics  . Smoking status: Current Every Day Smoker -- 0.25 packs/day for .3 years    Types: Cigarettes  . Smokeless tobacco: Never Used  . Alcohol Use: 0.0 oz/week     Comment: 09/18/2012 Drinks a 40-oz beer maybe/month"    Review of Systems  Constitutional: Positive for chills. Negative for fever.  Respiratory: Positive for  cough and shortness of breath.   Cardiovascular: Negative for chest pain.  Gastrointestinal: Positive for nausea, vomiting and abdominal pain. Negative for diarrhea.  Genitourinary: Negative for dysuria.  Musculoskeletal: Negative for back pain.  All other systems reviewed and are negative.      Allergies  Review of patient's allergies indicates no known allergies.  Home Medications   Current Outpatient Rx  Name  Route  Sig  Dispense  Refill  . albuterol (PROVENTIL HFA;VENTOLIN HFA) 108 (90 BASE) MCG/ACT inhaler   Inhalation   Inhale 2 puffs into the lungs every 6 (six) hours as needed for wheezing or shortness of breath.          BP 121/79  Pulse 124  Temp(Src) 98.8 F (37.1 C) (Oral)  Resp 20  SpO2 96% Physical Exam  Nursing note and vitals reviewed. Constitutional: He is oriented to person, place, and time. He appears well-developed and well-nourished.  HENT:  Head: Normocephalic and atraumatic.  Right Ear: External ear normal.  Left Ear: External ear normal.  Nose: Nose normal.  Eyes: Right eye exhibits no discharge. Left eye exhibits no discharge.  Neck: Neck supple.  Cardiovascular: Regular rhythm, normal heart sounds and intact distal pulses.  Tachycardia present.   Pulmonary/Chest: Effort normal and breath sounds normal.  Abdominal: Soft. There is generalized tenderness.  Musculoskeletal: He exhibits no edema.  Neurological: He  is alert and oriented to person, place, and time.  Skin: Skin is warm and dry.    ED Course  Procedures (including critical care time) Labs Review Labs Reviewed  COMPREHENSIVE METABOLIC PANEL - Abnormal; Notable for the following:    Glucose, Bld 132 (*)    Alkaline Phosphatase 120 (*)    All other components within normal limits  CBC WITH DIFFERENTIAL  LIPASE, BLOOD  URINALYSIS, ROUTINE W REFLEX MICROSCOPIC   Imaging Review Dg Chest 2 View  04/01/2013   CLINICAL DATA:  GENERALIZED BODY ACHES  EXAM: CHEST  2 VIEW   COMPARISON:  1View Chest 11/15/2012  FINDINGS: The heart size and mediastinal contours are within normal limits. Both lungs are clear. The visualized skeletal structures are unremarkable.  IMPRESSION: No active cardiopulmonary disease.   Electronically Signed   By: Salome HolmesHector  Cooper M.D.   On: 04/01/2013 12:02   Ct Abdomen Pelvis W Contrast  04/01/2013   CLINICAL DATA:  diffuse abdominal pain, chills  EXAM: CT ABDOMEN AND PELVIS WITH CONTRAST  TECHNIQUE: Multidetector CT imaging of the abdomen and pelvis was performed using the standard protocol following bolus administration of intravenous contrast.  CONTRAST:  100mL OMNIPAQUE IOHEXOL 300 MG/ML  SOLN  COMPARISON:  CT ABD/PELVIS W CM dated 11/15/2012  FINDINGS: Mild hypoventilation is appreciated within the lung bases.  The liver, spleen, adrenals, pancreas, right kidney are unremarkable. A stable, benign, 8 mm cyst is appreciated within the medial lower pole of the left kidney.  There is no evidence of bowel obstruction, enteritis, colitis, diverticulitis, nor appendicitis. The appendix is identified and is unremarkable.  There is no evidence of abdominal aortic aneurysm. The celiac, SMA, IMA, portal vein, SMV are opacified. Patient is status post cholecystectomy.  There is no evidence of abdominal or pelvic free fluid, loculated fluid collections, masses, nor adenopathy.  Mild diverticulosis is appreciated within the descending colon.  There is no evidence of abdominal wall nor inguinal hernia.  The osseous structures demonstrate no evidence of aggressive appearing new lesions.  IMPRESSION: No CT evidence of focal or acute abnormalities. There is no evidence of obstructive or inflammatory abnormalities.  The small cyst left kidney Bosniak type 1   Electronically Signed   By: Salome HolmesHector  Cooper M.D.   On: 04/01/2013 12:58    EKG Interpretation   None       MDM   Final diagnoses:  Abdominal pain  ETOH abuse    Patient's abdominal pain is likely from  alcohol abuse last night. CT scan is benign. His symptoms, including his tachycardia and pain, improved with hydration and pain meds. On reevaluation the patient was asleep and in no pain upon awakening. His heart rate is normalized. Besides the drinking binge last night there is no obvious cause for his abdominal pain.    Audree CamelScott T Yotam Rhine, MD 04/01/13 [phone]

## 2013-04-01 NOTE — ED Notes (Signed)
Pt states he was drinking beer last night w/ his brother, woke up this morning w/ abdominal pain, nausea, states felt like needed to vomit but nothing came up, denies diarrhea, states when he coughs his whole body hurts, fever, chills, denies congestion, pt states drank coffee this morning and last ate [employer] yesterday.

## 2013-04-01 NOTE — ED Notes (Signed)
Patient transported to CT 

## 2013-04-01 NOTE — ED Notes (Signed)
Per EMS pt drinking all night; c/o body aches and asthma flairing up; ambulatory on arrival

## 2013-04-15 ENCOUNTER — Encounter (HOSPITAL_COMMUNITY): Payer: Self-pay | Admitting: Emergency Medicine

## 2013-04-15 ENCOUNTER — Emergency Department (HOSPITAL_COMMUNITY)
Admission: EM | Admit: 2013-04-15 | Discharge: 2013-04-16 | Disposition: A | Discharge status: Home / Self Care | Payer: Self-pay | Attending: Emergency Medicine | Admitting: Emergency Medicine

## 2013-04-15 DIAGNOSIS — F10929 Alcohol use, unspecified with intoxication, unspecified: Secondary | ICD-10-CM

## 2013-04-15 DIAGNOSIS — F1010 Alcohol abuse, uncomplicated: Secondary | ICD-10-CM | POA: Insufficient documentation

## 2013-04-15 DIAGNOSIS — Z8679 Personal history of other diseases of the circulatory system: Secondary | ICD-10-CM | POA: Insufficient documentation

## 2013-04-15 DIAGNOSIS — J45909 Unspecified asthma, uncomplicated: Secondary | ICD-10-CM | POA: Insufficient documentation

## 2013-04-15 DIAGNOSIS — Z79899 Other long term (current) drug therapy: Secondary | ICD-10-CM | POA: Insufficient documentation

## 2013-04-15 DIAGNOSIS — R109 Unspecified abdominal pain: Secondary | ICD-10-CM

## 2013-04-15 DIAGNOSIS — K2920 Alcoholic gastritis without bleeding: Secondary | ICD-10-CM

## 2013-04-15 DIAGNOSIS — F17200 Nicotine dependence, unspecified, uncomplicated: Secondary | ICD-10-CM | POA: Insufficient documentation

## 2013-04-15 LAB — CBC WITH DIFFERENTIAL/PLATELET
BASOS PCT: 0 % (ref 0–1)
Basophils Absolute: 0 10*3/uL (ref 0.0–0.1)
EOS ABS: 0 10*3/uL (ref 0.0–0.7)
EOS PCT: 0 % (ref 0–5)
HCT: 46.5 % (ref 39.0–52.0)
Hemoglobin: 17.1 g/dL — ABNORMAL HIGH (ref 13.0–17.0)
LYMPHS ABS: 2.8 10*3/uL (ref 0.7–4.0)
Lymphocytes Relative: 34 % (ref 12–46)
MCH: 32.8 pg (ref 26.0–34.0)
MCHC: 36.8 g/dL — AB (ref 30.0–36.0)
MCV: 89.1 fL (ref 78.0–100.0)
MONOS PCT: 6 % (ref 3–12)
Monocytes Absolute: 0.5 10*3/uL (ref 0.1–1.0)
Neutro Abs: 4.9 10*3/uL (ref 1.7–7.7)
Neutrophils Relative %: 60 % (ref 43–77)
PLATELETS: 202 10*3/uL (ref 150–400)
RBC: 5.22 MIL/uL (ref 4.22–5.81)
RDW: 13.4 % (ref 11.5–15.5)
WBC: 8.2 10*3/uL (ref 4.0–10.5)

## 2013-04-15 LAB — COMPREHENSIVE METABOLIC PANEL
ALT: 28 U/L (ref 0–53)
AST: 18 U/L (ref 0–37)
Albumin: 4.2 g/dL (ref 3.5–5.2)
Alkaline Phosphatase: 121 U/L — ABNORMAL HIGH (ref 39–117)
BUN: 14 mg/dL (ref 6–23)
CALCIUM: 8.5 mg/dL (ref 8.4–10.5)
CO2: 24 mEq/L (ref 19–32)
Chloride: 104 mEq/L (ref 96–112)
Creatinine, Ser: 0.73 mg/dL (ref 0.50–1.35)
GFR calc Af Amer: >90 mL/min (ref >90)
GFR calc non Af Amer: >90 mL/min (ref >90)
Glucose, Bld: 116 mg/dL — ABNORMAL HIGH (ref 70–99)
Potassium: 4.1 mEq/L (ref 3.7–5.3)
Sodium: 147 mEq/L (ref 137–147)
TOTAL PROTEIN: 7.5 g/dL (ref 6.0–8.3)
Total Bilirubin: 0.2 mg/dL — ABNORMAL LOW (ref 0.3–1.2)

## 2013-04-15 LAB — I-STAT TROPONIN, ED: TROPONIN I, POC: 0 ng/mL (ref 0.00–0.08)

## 2013-04-15 LAB — ETHANOL: Alcohol, Ethyl (B): 314 mg/dL — ABNORMAL HIGH (ref 0–11)

## 2013-04-15 MED ORDER — FENTANYL CITRATE 0.05 MG/ML IJ SOLN
50.0000 ug | Freq: Once | INTRAMUSCULAR | Status: AC
  Administered 2013-04-15: 50 ug via INTRAVENOUS
  Filled 2013-04-15: qty 2

## 2013-04-15 MED ORDER — SODIUM CHLORIDE 0.9 % IV BOLUS (SEPSIS)
1000.0000 mL | Freq: Once | INTRAVENOUS | Status: AC
  Administered 2013-04-15: 1000 mL via INTRAVENOUS

## 2013-04-15 MED ORDER — PANTOPRAZOLE SODIUM 40 MG IV SOLR
40.0000 mg | Freq: Once | INTRAVENOUS | Status: AC
  Administered 2013-04-15: 40 mg via INTRAVENOUS
  Filled 2013-04-15: qty 40

## 2013-04-15 MED ORDER — ONDANSETRON HCL 4 MG/2ML IJ SOLN
4.0000 mg | Freq: Once | INTRAMUSCULAR | Status: AC
  Administered 2013-04-15: 4 mg via INTRAVENOUS
  Filled 2013-04-15: qty 2

## 2013-04-15 NOTE — ED Provider Notes (Signed)
CSN: [id number]     Arrival date & time 04/15/13  2236 History   First MD Initiated Contact with Patient 04/15/13 2253     Chief Complaint  Patient presents with  . Abdominal Pain     (Consider location/radiation/quality/duration/timing/severity/associated sxs/prior Treatment) HPI 48 year old male presents to the emergency department via EMS with complaint of left upper abdominal pain.  Patient is intoxicated, is a poor historian.  He is unsure when the pain began.  He reports pain is similar to prior episodes of gastritis.  He denies previous history of pancreatitis.  Patient without fever, chills, no nausea or vomiting. Past Medical History  Diagnosis Date  . ETOH abuse   . Asthma   . Tobacco abuse   . Enlarged heart     "when I was little" (09/18/2012)  . Chest pain     "only related to asthma attack" (09/18/2012)  . Shortness of breath     " related to asthma attack; sometimes in the mornings too" (09/18/2012)  . Sleep apnea     "I don't wear a mask" (09/18/2012)  . Headache(784.0)     "only related to asthma attack" (09/18/2012)   Past Surgical History  Procedure Laterality Date  . Laparoscopic cholecystectomy  2007    [place] in [place]   Family History  Problem Relation Age of Onset  . Heart attack Paternal Grandfather [phone]  . Colon cancer Paternal Grandmother    History  Substance Use Topics  . Smoking status: Current Every Day Smoker -- 0.25 packs/day for .3 years    Types: Cigarettes  . Smokeless tobacco: Never Used  . Alcohol Use: 0.0 oz/week     Comment: 09/18/2012 Drinks a 40-oz beer maybe/month"    Review of Systems  See History of Present Illness; otherwise all other systems are reviewed and negative   Allergies  Review of patient's allergies indicates no known allergies.  Home Medications   Current Outpatient Rx  Name  Route  Sig  Dispense  Refill  . albuterol (PROVENTIL HFA;VENTOLIN HFA) 108 (90 BASE) MCG/ACT inhaler   Inhalation   Inhale 2  puffs into the lungs every 6 (six) hours as needed for wheezing or shortness of breath.          BP 110/75  Pulse 91  Temp(Src) 98.1 F (36.7 C) (Oral)  Resp 14  SpO2 98% Physical Exam  Nursing note and vitals reviewed. Constitutional: He appears well-developed and well-nourished. He appears distressed.  HENT:  Head: Normocephalic and atraumatic.  Nose: Nose normal.  Mouth/Throat: Oropharynx is clear and moist.  Eyes: Conjunctivae and EOM are normal. Pupils are equal, round, and reactive to light.  Neck: Normal range of motion. Neck supple. No JVD present. No tracheal deviation present. No thyromegaly present.  Cardiovascular: Normal rate, regular rhythm, normal heart sounds and intact distal pulses.  Exam reveals no gallop and no friction rub.   No murmur heard. Pulmonary/Chest: Effort normal and breath sounds normal. No stridor. No respiratory distress. He has no wheezes. He has no rales. He exhibits no tenderness.  Abdominal: Soft. Bowel sounds are normal. He exhibits no distension and no mass. There is tenderness (diffuse abdominal pain). There is no rebound and no guarding.  Musculoskeletal: Normal range of motion. He exhibits no edema and no tenderness.  Lymphadenopathy:    He has no cervical adenopathy.  Neurological: He is alert. He exhibits normal muscle tone. Coordination normal.  Skin: Skin is warm and dry. No rash noted. No erythema. No  pallor.  Psychiatric:  Patient has poor insight and judgment    ED Course  Procedures (including critical care time) Labs Review Labs Reviewed  CBC WITH DIFFERENTIAL - Abnormal; Notable for the following:    Hemoglobin 17.1 (*)    MCHC 36.8 (*)    All other components within normal limits  COMPREHENSIVE METABOLIC PANEL - Abnormal; Notable for the following:    Glucose, Bld 116 (*)    Alkaline Phosphatase 121 (*)    Total Bilirubin 0.2 (*)    All other components within normal limits  ETHANOL - Abnormal; Notable for the  following:    Alcohol, Ethyl (B) 314 (*)    All other components within normal limits  LIPASE, BLOOD  URINALYSIS, ROUTINE W REFLEX MICROSCOPIC  I-STAT TROPOININ, ED   Imaging Review No results found.   EKG Interpretation   Date/Time:  Thursday April 15 2013 22:41:01 EDT Ventricular Rate:  101 PR Interval:  164 QRS Duration: 82 QT Interval:  337 QTC Calculation: 437 R Axis:   80 Text Interpretation:  Sinus tachycardia No significant change since last  tracing Confirmed by Tranesha Lessner  MD, Sheriann Newmann ([id number]) on 04/15/2013 10:57:31 PM      MDM   Final diagnoses:  Alcohol intoxication  Abdominal pain  Alcoholic gastritis    48 year old male with abdominal pain, recent alcohol ingestion.  Differential includes pancreatitis, alcoholic gastritis, less likely, diverticulitis, colitis.  Patient had CT scan within the last month.  We'll get labs, give IV fluids, Protonix, pain medicine.  Will have to metabolize his alcohol and reassess.  12:51 AM Pt reports pain has resolved.  If able to get a ride home, can d/c, if not will place on pod c for sobriety    Olivia Mackie, MD 04/16/13 [phone]

## 2013-04-15 NOTE — ED Notes (Addendum)
PER EMS: pt reports tender, non-radiating, constant sharp LLQ abdominal pain, unsure of time when pain began. Pt A&Ox4, moaning in pain. 18g IV Lhand. BP-130/60, HR-94, CBG-125, RR-20.

## 2013-04-16 LAB — URINALYSIS, ROUTINE W REFLEX MICROSCOPIC
Bilirubin Urine: NEGATIVE
Glucose, UA: NEGATIVE mg/dL
Hgb urine dipstick: NEGATIVE
Ketones, ur: 15 mg/dL — AB
Leukocytes, UA: NEGATIVE
NITRITE: NEGATIVE
PH: 5 (ref 5.0–8.0)
Protein, ur: NEGATIVE mg/dL
SPECIFIC GRAVITY, URINE: 1.017 (ref 1.005–1.030)
Urobilinogen, UA: 0.2 mg/dL (ref 0.0–1.0)

## 2013-04-16 LAB — LIPASE, BLOOD: LIPASE: 28 U/L (ref 11–59)

## 2013-04-16 MED ORDER — FAMOTIDINE 20 MG PO TABS: 20.0000 mg | ORAL_TABLET | Freq: Two times a day (BID) | ORAL | Status: DC

## 2013-04-16 MED ORDER — GI COCKTAIL ~~LOC~~
30.0000 mL | Freq: Once | ORAL | Status: AC
  Administered 2013-04-16: 30 mL via ORAL
  Filled 2013-04-16: qty 30

## 2013-04-16 NOTE — Discharge Instructions (Signed)
Finding Treatment for Alcohol and Drug Addiction It can be hard to find the right place to get professional treatment. Here are some important things to consider:  There are different types of treatment to choose from.  Some programs are live-in (residential) while others are not (outpatient). Sometimes a combination is offered.  No single type of program is right for everyone.  Most treatment programs involve a combination of education, counseling, and a 12-step, spiritually-based approach.  There are non-spiritually based programs (not 12-step).  Some treatment programs are government sponsored. They are geared for patients without private insurance.  Treatment programs can vary in many respects such as:  Cost and types of insurance accepted.  Types of on-site medical services offered.  Length of stay, setting, and size.  Overall philosophy of treatment. A person may need specialized treatment or have needs not addressed by all programs. For example, adolescents need treatment appropriate for their age. Other people have secondary disorders that must be managed as well. Secondary conditions can include mental illness, such as depression or diabetes. Often, a period of detoxification from alcohol or drugs is needed. This requires medical supervision and not all programs offer this. THINGS TO CONSIDER WHEN SELECTING A TREATMENT PROGRAM   Is the program certified by the appropriate government agency? Even private programs must be certified and employ certified professionals.  Does the program accept your insurance? If not, can a payment plan be set up?  Is the facility clean, organized, and well run? Do they allow you to speak with graduates who can share their treatment experience with you? Can you tour the facility? Can you meet with staff?  Does the program meet the full range of individual needs?  Does the treatment program address sexual orientation and physical disabilities?  Do they provide age, gender, and culturally appropriate treatment services?  Is treatment available in languages other than English?  Is long-term aftercare support or guidance encouraged and provided?  Is assessment of an individual's treatment plan ongoing to ensure it meets changing needs?  Does the program use strategies to encourage reluctant patients to remain in treatment long enough to increase the likelihood of success?  Does the program offer counseling (individual or group) and other behavioral therapies?  Does the program offer medicine as part of the treatment regimen, if needed?  Is there ongoing monitoring of possible relapse? Is there a defined relapse prevention program? Are services or referrals offered to family members to ensure they understand addiction and the recovery process? This would help them support the recovering individual.  Are 12-step meetings held at the center or is transport available for patients to attend outside meetings? In countries outside of the [place] and [place], [employer] for contact information for services in your area. Document Released: 12/20/2004 Document Revised: 04/15/2011 Document Reviewed: 07/02/2007 [facility] Patient Information 2014 [place].  Alcohol Intoxication Alcohol intoxication occurs when the amount of alcohol that a person has consumed impairs his or her ability to mentally and physically function. Alcohol directly impairs the normal chemical activity of the brain. Drinking large amounts of alcohol can lead to changes in mental function and behavior, and it can cause many physical effects that can be harmful.  Alcohol intoxication can range in severity from mild to very severe. Various factors can affect the level of intoxication that occurs, such as the person's age, gender, weight, frequency of alcohol consumption, and the presence of other medical conditions (such as diabetes, seizures, or heart conditions).  Dangerous levels of alcohol intoxication may occur when people drink large amounts of alcohol in a short period (binge drinking). Alcohol can also be especially dangerous when combined with certain prescription medicines or "recreational" drugs. SIGNS AND SYMPTOMS Some common signs and symptoms of mild alcohol intoxication include:  Loss of coordination.  Changes in mood and behavior.  Impaired judgment.  Slurred speech. As alcohol intoxication progresses to more severe levels, other signs and symptoms will appear. These may include:  Vomiting.  Confusion and impaired memory.  Slowed breathing.  Seizures.  Loss of consciousness. DIAGNOSIS  Your health care provider will take a medical history and perform a physical exam. You will be asked about the amount and type of alcohol you have consumed. Blood tests will be done to measure the concentration of alcohol in your blood. In many places, your blood alcohol level must be lower than 80 mg/dL (8.11%0.08%) to legally drive. However, many dangerous effects of alcohol can occur at much lower levels.  TREATMENT  People with alcohol intoxication often do not require treatment. Most of the effects of alcohol intoxication are temporary, and they go away as the alcohol naturally leaves the body. Your health care provider will monitor your condition until you are stable enough to go home. Fluids are sometimes given through an IV access tube to help prevent dehydration.  HOME CARE INSTRUCTIONS  Do not drive after drinking alcohol.  Stay hydrated. Drink enough water and fluids to keep your urine clear or pale yellow. Avoid caffeine.   Only take over-the-counter or prescription medicines as directed by your health care provider.  SEEK MEDICAL CARE IF:   You have persistent vomiting.   You do not feel better after a few days.  You have frequent alcohol intoxication. Your health care provider can help determine if you should see a substance use  treatment counselor. SEEK IMMEDIATE MEDICAL CARE IF:   You become shaky or tremble when you try to stop drinking.   You shake uncontrollably (seizure).   You throw up (vomit) blood. This may be bright red or may look like black coffee grounds.   You have blood in your stool. This may be bright red or may appear as a black, tarry, bad smelling stool.   You become lightheaded or faint.  MAKE SURE YOU:   Understand these instructions.  Will watch your condition.  Will get help right away if you are not doing well or get worse. Document Released: 10/31/2004 Document Revised: 09/23/2012 Document Reviewed: 06/26/2012 [facility] Patient Information 2014 [place].  Abdominal Pain, Adult Many things can cause belly (abdominal) pain. Most times, the belly pain is not dangerous. Many cases of belly pain can be watched and treated at home. HOME CARE   Do not take medicines that help you go poop (laxatives) unless told to by your doctor.  Only take medicine as told by your doctor.  Eat or drink as told by your doctor. Your doctor will tell you if you should be on a special diet. GET HELP IF:  You do not know what is causing your belly pain.  You have belly pain while you are sick to your stomach (nauseous) or have runny poop (diarrhea).  You have pain while you pee or poop.  Your belly pain wakes you up at night.  You have belly pain that gets worse or better when you eat.  You have belly pain that gets worse when you eat fatty foods. GET HELP RIGHT AWAY IF:  The pain does not go away within 2 hours.  You have a fever.  You keep throwing up (vomiting).  The pain changes and is only in the right or left part of the belly.  You have bloody or tarry looking poop. MAKE SURE YOU:   Understand these instructions.  Will watch your condition.  Will get help right away if you are not doing well or get worse. Document Released: 07/10/2007 Document Revised: 11/11/2012  Document Reviewed: 09/30/2012 [facility] Patient Information 2014 [place], [place].  Gastritis, Adult Gastritis is soreness and swelling (inflammation) of the lining of the stomach. Gastritis can develop as a sudden onset (acute) or long-term (chronic) condition. If gastritis is not treated, it can lead to stomach bleeding and ulcers. CAUSES  Gastritis occurs when the stomach lining is weak or damaged. Digestive juices from the stomach then inflame the weakened stomach lining. The stomach lining may be weak or damaged due to viral or bacterial infections. One common bacterial infection is the Helicobacter pylori infection. Gastritis can also result from excessive alcohol consumption, taking certain medicines, or having too much acid in the stomach.  SYMPTOMS  In some cases, there are no symptoms. When symptoms are present, they may include:  Pain or a burning sensation in the upper abdomen.  Nausea.  Vomiting.  An uncomfortable feeling of fullness after eating. DIAGNOSIS  Your caregiver may suspect you have gastritis based on your symptoms and a physical exam. To determine the cause of your gastritis, your caregiver may perform the following:  Blood or stool tests to check for the H pylori bacterium.  Gastroscopy. A thin, flexible tube (endoscope) is passed down the esophagus and into the stomach. The endoscope has a light and camera on the end. Your caregiver uses the endoscope to view the inside of the stomach.  Taking a tissue sample (biopsy) from the stomach to examine under a microscope. TREATMENT  Depending on the cause of your gastritis, medicines may be prescribed. If you have a bacterial infection, such as an H pylori infection, antibiotics may be given. If your gastritis is caused by too much acid in the stomach, H2 blockers or antacids may be given. Your caregiver may recommend that you stop taking aspirin, ibuprofen, or other nonsteroidal anti-inflammatory drugs (NSAIDs). HOME  CARE INSTRUCTIONS  Only take over-the-counter or prescription medicines as directed by your caregiver.  If you were given antibiotic medicines, take them as directed. Finish them even if you start to feel better.  Drink enough fluids to keep your urine clear or pale yellow.  Avoid foods and drinks that make your symptoms worse, such as:  Caffeine or alcoholic drinks.  Chocolate.  Peppermint or mint flavorings.  Garlic and onions.  Spicy foods.  Citrus fruits, such as oranges, lemons, or limes.  Tomato-based foods such as sauce, chili, salsa, and pizza.  Fried and fatty foods.  Eat small, frequent meals instead of large meals. SEEK IMMEDIATE MEDICAL CARE IF:   You have black or dark red stools.  You vomit blood or material that looks like coffee grounds.  You are unable to keep fluids down.  Your abdominal pain gets worse.  You have a fever.  You do not feel better after 1 week.  You have any other questions or concerns. MAKE SURE YOU:  Understand these instructions.  Will watch your condition.  Will get help right away if you are not doing well or get worse. Document Released: 01/15/2001 Document Revised: 07/23/2011 Document Reviewed: 03/06/2011 [employer]  Patient Information 2014 [place].

## 2013-05-10 ENCOUNTER — Emergency Department (HOSPITAL_COMMUNITY)
Admission: EM | Admit: 2013-05-10 | Discharge: 2013-05-10 | Discharge status: Against Medical Advice | Payer: Self-pay | Attending: Emergency Medicine | Admitting: Emergency Medicine

## 2013-05-10 ENCOUNTER — Encounter (HOSPITAL_COMMUNITY): Payer: Self-pay | Admitting: Emergency Medicine

## 2013-05-10 DIAGNOSIS — R1084 Generalized abdominal pain: Secondary | ICD-10-CM | POA: Insufficient documentation

## 2013-05-10 DIAGNOSIS — R1013 Epigastric pain: Secondary | ICD-10-CM

## 2013-05-10 DIAGNOSIS — R197 Diarrhea, unspecified: Secondary | ICD-10-CM | POA: Insufficient documentation

## 2013-05-10 DIAGNOSIS — J45909 Unspecified asthma, uncomplicated: Secondary | ICD-10-CM | POA: Insufficient documentation

## 2013-05-10 DIAGNOSIS — F17200 Nicotine dependence, unspecified, uncomplicated: Secondary | ICD-10-CM | POA: Insufficient documentation

## 2013-05-10 DIAGNOSIS — R1110 Vomiting, unspecified: Secondary | ICD-10-CM | POA: Insufficient documentation

## 2013-05-10 LAB — CBC WITH DIFFERENTIAL/PLATELET
BASOS PCT: 0 % (ref 0–1)
Basophils Absolute: 0 10*3/uL (ref 0.0–0.1)
Eosinophils Absolute: 0.1 10*3/uL (ref 0.0–0.7)
Eosinophils Relative: 1 % (ref 0–5)
HEMATOCRIT: 42.3 % (ref 39.0–52.0)
HEMOGLOBIN: 15 g/dL (ref 13.0–17.0)
Lymphocytes Relative: 36 % (ref 12–46)
Lymphs Abs: 2.6 10*3/uL (ref 0.7–4.0)
MCH: 31.1 pg (ref 26.0–34.0)
MCHC: 35.5 g/dL (ref 30.0–36.0)
MCV: 87.8 fL (ref 78.0–100.0)
MONO ABS: 0.4 10*3/uL (ref 0.1–1.0)
MONOS PCT: 6 % (ref 3–12)
Neutro Abs: 4.1 10*3/uL (ref 1.7–7.7)
Neutrophils Relative %: 57 % (ref 43–77)
Platelets: 183 10*3/uL (ref 150–400)
RBC: 4.82 MIL/uL (ref 4.22–5.81)
RDW: 12.9 % (ref 11.5–15.5)
WBC: 7.2 10*3/uL (ref 4.0–10.5)

## 2013-05-10 LAB — COMPREHENSIVE METABOLIC PANEL
ALK PHOS: 114 U/L (ref 39–117)
ALT: 34 U/L (ref 0–53)
AST: 28 U/L (ref 0–37)
Albumin: 3.9 g/dL (ref 3.5–5.2)
BUN: 9 mg/dL (ref 6–23)
CALCIUM: 8.6 mg/dL (ref 8.4–10.5)
CHLORIDE: 106 meq/L (ref 96–112)
CO2: 23 meq/L (ref 19–32)
Creatinine, Ser: 0.72 mg/dL (ref 0.50–1.35)
GFR calc Af Amer: >90 mL/min (ref >90)
GFR calc non Af Amer: >90 mL/min (ref >90)
Glucose, Bld: 113 mg/dL — ABNORMAL HIGH (ref 70–99)
POTASSIUM: 3.6 meq/L — AB (ref 3.7–5.3)
SODIUM: 142 meq/L (ref 137–147)
Total Bilirubin: 0.2 mg/dL — ABNORMAL LOW (ref 0.3–1.2)
Total Protein: 7 g/dL (ref 6.0–8.3)

## 2013-05-10 LAB — LIPASE, BLOOD: Lipase: 30 U/L (ref 11–59)

## 2013-05-10 LAB — ETHANOL: Alcohol, Ethyl (B): 283 mg/dL — ABNORMAL HIGH (ref 0–11)

## 2013-05-10 MED ORDER — PANTOPRAZOLE SODIUM 40 MG IV SOLR: 40.0000 mg | Freq: Once | INTRAVENOUS | Status: DC

## 2013-05-10 NOTE — ED Notes (Signed)
[facility] Expected date:  Expected time:  Means of arrival:  Comments: ems- abdominal pain ETOH

## 2013-05-10 NOTE — ED Notes (Signed)
Patient having diffuse abdominal pain today after drinking an unknown quantity of Vodka today with his brother.  Patient reports he vomited once and had one episode of diarrhea.  Patient ambulated at the scene but appears intoxicated. CBG-[id number]

## 2013-05-10 NOTE — ED Notes (Signed)
Pt informed he would not be receiving pain meds, pt decided to leave AMA

## 2013-05-15 NOTE — ED Provider Notes (Signed)
Patient left prior to examination and discussion by me per nursing.  Was ambulatory with capacity to make informed decision per nursing.  No grounds for IVC.    Nelia Shiobert L Lulamae Skorupski, MD 05/15/13 [phone]

## 2013-07-31 ENCOUNTER — Emergency Department (HOSPITAL_COMMUNITY): Payer: Self-pay

## 2013-07-31 ENCOUNTER — Emergency Department (HOSPITAL_COMMUNITY)
Admission: EM | Admit: 2013-07-31 | Discharge: 2013-07-31 | Disposition: A | Discharge status: Home / Self Care | Payer: Self-pay | Attending: Emergency Medicine | Admitting: Emergency Medicine

## 2013-07-31 ENCOUNTER — Encounter (HOSPITAL_COMMUNITY): Payer: Self-pay | Admitting: Emergency Medicine

## 2013-07-31 DIAGNOSIS — S20229A Contusion of unspecified back wall of thorax, initial encounter: Secondary | ICD-10-CM | POA: Insufficient documentation

## 2013-07-31 DIAGNOSIS — S60512A Abrasion of left hand, initial encounter: Secondary | ICD-10-CM

## 2013-07-31 DIAGNOSIS — S3981XA Other specified injuries of abdomen, initial encounter: Secondary | ICD-10-CM | POA: Insufficient documentation

## 2013-07-31 DIAGNOSIS — S1093XA Contusion of unspecified part of neck, initial encounter: Secondary | ICD-10-CM

## 2013-07-31 DIAGNOSIS — S6990XA Unspecified injury of unspecified wrist, hand and finger(s), initial encounter: Secondary | ICD-10-CM | POA: Insufficient documentation

## 2013-07-31 DIAGNOSIS — R109 Unspecified abdominal pain: Secondary | ICD-10-CM | POA: Insufficient documentation

## 2013-07-31 DIAGNOSIS — F17200 Nicotine dependence, unspecified, uncomplicated: Secondary | ICD-10-CM | POA: Insufficient documentation

## 2013-07-31 DIAGNOSIS — Z9089 Acquired absence of other organs: Secondary | ICD-10-CM | POA: Insufficient documentation

## 2013-07-31 DIAGNOSIS — Z8679 Personal history of other diseases of the circulatory system: Secondary | ICD-10-CM | POA: Insufficient documentation

## 2013-07-31 DIAGNOSIS — Z23 Encounter for immunization: Secondary | ICD-10-CM | POA: Insufficient documentation

## 2013-07-31 DIAGNOSIS — S060X1A Concussion with loss of consciousness of 30 minutes or less, initial encounter: Secondary | ICD-10-CM | POA: Insufficient documentation

## 2013-07-31 DIAGNOSIS — S0993XA Unspecified injury of face, initial encounter: Secondary | ICD-10-CM | POA: Insufficient documentation

## 2013-07-31 DIAGNOSIS — S80212A Abrasion, left knee, initial encounter: Secondary | ICD-10-CM

## 2013-07-31 DIAGNOSIS — S0003XA Contusion of scalp, initial encounter: Secondary | ICD-10-CM | POA: Insufficient documentation

## 2013-07-31 DIAGNOSIS — S0083XA Contusion of other part of head, initial encounter: Secondary | ICD-10-CM | POA: Insufficient documentation

## 2013-07-31 DIAGNOSIS — S060X9A Concussion with loss of consciousness of unspecified duration, initial encounter: Secondary | ICD-10-CM

## 2013-07-31 DIAGNOSIS — IMO0002 Reserved for concepts with insufficient information to code with codable children: Secondary | ICD-10-CM | POA: Insufficient documentation

## 2013-07-31 DIAGNOSIS — J45909 Unspecified asthma, uncomplicated: Secondary | ICD-10-CM | POA: Insufficient documentation

## 2013-07-31 DIAGNOSIS — S199XXA Unspecified injury of neck, initial encounter: Secondary | ICD-10-CM

## 2013-07-31 DIAGNOSIS — Z79899 Other long term (current) drug therapy: Secondary | ICD-10-CM | POA: Insufficient documentation

## 2013-07-31 DIAGNOSIS — S6980XA Other specified injuries of unspecified wrist, hand and finger(s), initial encounter: Secondary | ICD-10-CM | POA: Insufficient documentation

## 2013-07-31 MED ORDER — NAPROXEN 500 MG PO TABS: 500.0000 mg | ORAL_TABLET | Freq: Two times a day (BID) | ORAL | Status: DC

## 2013-07-31 MED ORDER — TETANUS-DIPHTH-ACELL PERTUSSIS 5-2.5-18.5 LF-MCG/0.5 IM SUSP
0.5000 mL | Freq: Once | INTRAMUSCULAR | Status: AC
  Administered 2013-07-31: 0.5 mL via INTRAMUSCULAR
  Filled 2013-07-31: qty 0.5

## 2013-07-31 MED ORDER — MORPHINE SULFATE 4 MG/ML IJ SOLN
4.0000 mg | Freq: Once | INTRAMUSCULAR | Status: AC
  Administered 2013-07-31: 4 mg via INTRAVENOUS

## 2013-07-31 MED ORDER — IOHEXOL 300 MG/ML  SOLN
100.0000 mL | Freq: Once | INTRAMUSCULAR | Status: AC | PRN
  Administered 2013-07-31: 100 mL via INTRAVENOUS

## 2013-07-31 MED ORDER — MORPHINE SULFATE 4 MG/ML IJ SOLN
4.0000 mg | Freq: Once | INTRAMUSCULAR | Status: DC
  Filled 2013-07-31: qty 1

## 2013-07-31 NOTE — ED Notes (Signed)
Given ice packs. 
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
## 2013-07-31 NOTE — Discharge Instructions (Signed)
Call for a follow up appointment with a Family or Primary Care Provider.  Return if Symptoms worsen.   Take medication as prescribed.  Ice your head, back, abdomen, knee, hand 3-4 times a day. Keep your wounds clean and dry. Avoid alcohol abuse, please contact someone if you think you have an alcohol problem or would like to stop drinking.   Emergency Department Resource Guide 1) Find a Doctor and Pay Out of Pocket Although you won't have to find out who is covered by your insurance plan, it is a good idea to ask around and get recommendations. You will then need to call the office and see if the doctor you have chosen will accept you as a new patient and what types of options they offer for patients who are self-pay. Some doctors offer discounts or will set up payment plans for their patients who do not have insurance, but you will need to ask so you aren't surprised when you get to your appointment.  2) Contact Your Local Health Department Not all health departments have doctors that can see patients for sick visits, but many do, so it is worth a call to see if yours does. If you don't know where your local health department is, you can check in your phone book. The CDC also has a tool to help you locate your state's health department, and many state websites also have listings of all of their local health departments.  3) Find a Walk-in Clinic If your illness is not likely to be very severe or complicated, you may want to try a walk in clinic. These are popping up all over the country in pharmacies, drugstores, and shopping centers. They're usually staffed by nurse practitioners or physician assistants that have been trained to treat common illnesses and complaints. They're usually fairly quick and inexpensive. However, if you have serious medical issues or chronic medical problems, these are probably not your best option.  No Primary Care Doctor: - Call Health Connect at  [phone] - they can  help you locate a primary care doctor that  accepts your insurance, provides certain services, etc. - Physician Referral Service- [phone]  Chronic Pain Problems: Organization         Address  Phone   Notes  [facility]  [phone] Patients need to be referred by their primary care doctor.   Medication Assistance: Organization         Address  Phone   Notes  [facility] Medication [facility] [address] [place], [place] [address] [phone] --Must be a resident of [place] -- Must have NO insurance coverage whatsoever (no Medicaid/ Medicare, etc.) -- The pt. MUST have a primary care doctor that directs their care regularly and follows them in the community   MedAssist  [phone]   [employer]  [phone]    Agencies that provide inexpensive medical care: Organization         Address  Phone   Notes  [facility]  [phone]   [facility]    [phone]   [facility] [address] [place], [place] [address] [phone]   [facility] [address], [place] [phone]   Planned Parenthood    [phone]   [facility]    [phone]   [facility]  [address], [place] Phone:  [phone], Fax:  [phone] Hours of Operation:  9 am - 6 pm, M-F.  Also accepts Medicaid/Medicare and self-pay.  [facility] for [place], [address], [place] Phone: [phone], Fax: [phone]. Hours of Operation:  8:30 am - 5:30 pm, M-F.  Also accepts Medicaid and self-pay.  [facility] [address] [place] Phone: [phone]   [place], [place], [place] [phone], Ext. [phone] Mondays & Thursdays: 7-9 AM.  First 15 patients are seen on a first come, first serve basis.    [place] Providers:  Organization         Address  Phone   Notes  [facility] [address], [place] [phone] Also accepts self-pay patients.  [facility] [id number] [place], [place]  [phone]   [place], [address], [place] [phone]   [facility] [address] [place] [phone]   Lucianne Lei [address], [place]   [phone] Only accepts [place] Access [place] patients after they have their name applied to their card.   Self-Pay (no insurance) in [facility]:  Organization         Address  Phone   Notes  Sickle Cell Patients, [facility] [place] [phone]   [facility] [place] [phone]   [facility] [place]  [place], [place], [place] [phone]   [facility]/Dr. Osei-Bonsu  [address], [place] or [address], [place] [phone] Phone number for both [place] and [place] locations is the same.  [facility]gent Medical and [facility] [address] [place] [phone]   [facility] [address] [place] or [address] [phone]   [facility] [address], [place] [phone], phone; [phone], fax Sees patients 1st and 3rd Saturday of every month.  Must not qualify for public or private insurance (i.e. Medicaid, Medicare, [place] Health Choice, Veterans' Benefits)  Household income should be no more than 200% of the poverty level The clinic cannot treat you if you are pregnant or think you are pregnant  Sexually transmitted diseases are not treated at the clinic.    Dental Care: Organization         Address  Phone  Notes  [facility] [place] [phone] Accepts children up to age 31 who are enrolled in [place] or [place]; pregnant women with a Medicaid card; and children who have applied for Medicaid or [place] Health Choice, but were declined, whose parents can pay a reduced fee at time of service.  [facility]  [address], [place] [phone] Accepts children up to age 34 who are enrolled in [place] or [place]; pregnant women with a Medicaid card; and children who have applied for Medicaid or [place] Health Choice, but were declined, whose parents can pay a reduced fee at time of service.  [place] Adult [facility] Access PROGRAM  [place] [phone] Patients are seen by appointment only. Walk-ins  are not accepted. [place] will see patients 33 years of age and older. Monday - Tuesday (8am-5pm) Most Wednesdays (8:30-5pm) $30 per visit, cash only  [facility] Adult [facility] Access PROGRAM  [address], [facility] [phone] Patients are seen by appointment only. Walk-ins are not accepted. [place] will see patients 17 years of age and older. One Wednesday Evening (Monthly: Volunteer Based).  $30 per visit, cash only  [place]  [phone] for adults; Children under age 66, call Graduate Pediatric Dentistry at [phone]. Children aged 52-14, please call [phone] to request a pediatric application.  Dental services are provided in all areas of dental care including fillings, crowns and bridges, complete and partial dentures, implants, gum treatment, root canals, and extractions. Preventive care is also provided. Treatment is provided to both adults and children. Patients are selected via a lottery and there is often a waiting list.   [facility] [address] [place]  [phone] [email or web address]   [facility] [address] [place], [place] [phone], Ext. [phone] Second and Fourth Thursday of each month, opens at 6:30 AM; Clinic ends at 9 AM.  Patients are seen on a first-come first-served basis, and a limited number are seen during each clinic.   [facility]  [address] Hillard Danker [place], [place] [phone]   Eligibility Requirements You must have lived in [place], [place], or [place] counties for at least the last three months.   You cannot be eligible for state or federal sponsored [employer], including [employer], [place], or [employer].   You generally cannot be eligible for healthcare insurance through your employer.    How to apply: Eligibility screenings are held every Tuesday and Wednesday afternoon from 1:00 pm until 4:00 pm. You do not need an appointment for the interview!  [facility] [address], [place], [place]   [place]  [place] Department  [place]  [phone]    Behavioral Health Resources in the Community: Intensive Outpatient Programs Organization         Address  Phone  Notes  [place]. [address] [place], [place] [phone]   [facility] Outpatient [address] [place], [place]   ADS: Alcohol & Drug Svcs [address] [place], [place]   [place] [address],  [place], [place] or [phone]   Substance Abuse Resources Organization         Address  Phone  Notes  Alcohol and Drug Services  [phone]   [place]  [phone]   [facility] [place]   Chinita Pester  [phone]   Residential & Outpatient Substance Abuse Program  [phone]   Psychological Services Organization         Address  Phone  Notes  [facility] [place]  [place]  [phone]   [place] [address], [place] [phone] or [phone]    Mobile Crisis Teams Organization         Address  Phone  Notes  [facility], Mobile Crisis Care Unit  [phone]   [facility]ive Psychotherapeutic Services  [address] [place], [place]   [facility] [address], [place] [phone]  Self-Help/Support Groups Organization         Address  Phone             Notes  [facility] [place] - variety of support groups  [place] Call for more information  Narcotics Anonymous (NA), [facility] [address], [employer] [place]  2 meetings at this location   Special educational needs teacher         Address  Phone  Notes  [facility] Residential Treatment [place],    [place]  [phone]   [facility]  [address] [place] [address], [place], [place]   [place], [place] [phone] Admissions: 8am-3pm M-F  Incentives Substance [place] [address],    [place], [place] [phone]   [facility] [address] [place], [place], [place]   [facility] [address],  [place], [place]   Insight Programs - Intensive Outpatient [address]een [address], [place], [place]   [facility] ([place].,  [place], [place] [phone] or [phone]   [facility] Services (RTS) [address] [place], [place] Accepts Medicaid  [facility] [place] [address],  [place] [phone] Substance Abuse/Addiction Treatment   [facility] Organization         Address  Phone  Notes  [facility]  [phone]   Domenic Schwab, PhD [address] [place], [place]   [phone] or  [phone]   [place] [employer], [place] [phone]   [facility] [address], [place], [place] [phone] Insurance/Medicaid/sponsorship through [facility] and Families [address], [address] [place]                                    [place], [place] [phone] [place] [address], [place] [phone]    Dr. Adele Schilder  [phone]   [facility] Dept. 1) [address], [place] 2) [place] 3)  [place] [address], [place] [phone]  [phone]   [place] [phone] or [phone] (After Hours)

## 2013-07-31 NOTE — ED Notes (Signed)
Back from xray, IV started, pending CTs, alert, NAD, calm, no dyspnea noted.

## 2013-07-31 NOTE — ED Notes (Signed)
Patient resting peacefully, no acute distress noted

## 2013-07-31 NOTE — ED Notes (Signed)
Pt to xray, no changes, alert, NAD, calm.

## 2013-07-31 NOTE — ED Notes (Signed)
Patient transported to CT 
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
## 2013-07-31 NOTE — ED Notes (Signed)
Patient returned from xray, monitor reapplied.  No acute distress noted.
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
## 2013-07-31 NOTE — ED Notes (Signed)
Dr Pickering at bedside 

## 2013-08-01 NOTE — ED Provider Notes (Signed)
Medical screening examination/treatment/procedure(s) were performed by non-physician practitioner and as supervising physician I was immediately available for consultation/collaboration.   EKG Interpretation None       Nathan R. Pickering, MD 08/01/13 0720 

## 2013-09-18 ENCOUNTER — Emergency Department (HOSPITAL_COMMUNITY): Payer: Self-pay

## 2013-09-18 ENCOUNTER — Emergency Department (HOSPITAL_COMMUNITY)
Admission: EM | Admit: 2013-09-18 | Discharge: 2013-09-19 | Disposition: A | Discharge status: Home / Self Care | Payer: Self-pay | Attending: Emergency Medicine | Admitting: Emergency Medicine

## 2013-09-18 DIAGNOSIS — J45909 Unspecified asthma, uncomplicated: Secondary | ICD-10-CM | POA: Insufficient documentation

## 2013-09-18 DIAGNOSIS — Z791 Long term (current) use of non-steroidal anti-inflammatories (NSAID): Secondary | ICD-10-CM | POA: Insufficient documentation

## 2013-09-18 DIAGNOSIS — S023XXA Fracture of orbital floor, initial encounter for closed fracture: Secondary | ICD-10-CM

## 2013-09-18 DIAGNOSIS — F17200 Nicotine dependence, unspecified, uncomplicated: Secondary | ICD-10-CM | POA: Insufficient documentation

## 2013-09-18 DIAGNOSIS — R51 Headache: Secondary | ICD-10-CM | POA: Insufficient documentation

## 2013-09-18 DIAGNOSIS — S0280XA Fracture of other specified skull and facial bones, unspecified side, initial encounter for closed fracture: Secondary | ICD-10-CM | POA: Insufficient documentation

## 2013-09-18 DIAGNOSIS — S02839A Fracture of medial orbital wall, unspecified side, initial encounter for closed fracture: Secondary | ICD-10-CM

## 2013-09-18 DIAGNOSIS — S0590XA Unspecified injury of unspecified eye and orbit, initial encounter: Secondary | ICD-10-CM | POA: Insufficient documentation

## 2013-09-18 DIAGNOSIS — Z8679 Personal history of other diseases of the circulatory system: Secondary | ICD-10-CM | POA: Insufficient documentation

## 2013-09-18 DIAGNOSIS — Z23 Encounter for immunization: Secondary | ICD-10-CM | POA: Insufficient documentation

## 2013-09-18 DIAGNOSIS — S0230XA Fracture of orbital floor, unspecified side, initial encounter for closed fracture: Secondary | ICD-10-CM | POA: Insufficient documentation

## 2013-09-18 LAB — COMPREHENSIVE METABOLIC PANEL
ALK PHOS: 118 U/L — AB (ref 39–117)
ALT: 22 U/L (ref 0–53)
AST: 19 U/L (ref 0–37)
Albumin: 4.1 g/dL (ref 3.5–5.2)
Anion gap: 22 — ABNORMAL HIGH (ref 5–15)
BUN: 6 mg/dL (ref 6–23)
CO2: 18 meq/L — AB (ref 19–32)
Calcium: 9.1 mg/dL (ref 8.4–10.5)
Chloride: 103 mEq/L (ref 96–112)
Creatinine, Ser: 0.8 mg/dL (ref 0.50–1.35)
GLUCOSE: 155 mg/dL — AB (ref 70–99)
Potassium: 3.6 mEq/L — ABNORMAL LOW (ref 3.7–5.3)
Sodium: 143 mEq/L (ref 137–147)
Total Bilirubin: 0.2 mg/dL — ABNORMAL LOW (ref 0.3–1.2)
Total Protein: 7.5 g/dL (ref 6.0–8.3)

## 2013-09-18 LAB — CBC WITH DIFFERENTIAL/PLATELET
Basophils Absolute: 0 10*3/uL (ref 0.0–0.1)
Basophils Relative: 0 % (ref 0–1)
Eosinophils Absolute: 0 10*3/uL (ref 0.0–0.7)
Eosinophils Relative: 1 % (ref 0–5)
HCT: 43.4 % (ref 39.0–52.0)
Hemoglobin: 15.7 g/dL (ref 13.0–17.0)
LYMPHS ABS: 3.1 10*3/uL (ref 0.7–4.0)
LYMPHS PCT: 49 % — AB (ref 12–46)
MCH: 31.7 pg (ref 26.0–34.0)
MCHC: 36.2 g/dL — ABNORMAL HIGH (ref 30.0–36.0)
MCV: 87.7 fL (ref 78.0–100.0)
Monocytes Absolute: 0.4 10*3/uL (ref 0.1–1.0)
Monocytes Relative: 6 % (ref 3–12)
NEUTROS ABS: 2.8 10*3/uL (ref 1.7–7.7)
Neutrophils Relative %: 44 % (ref 43–77)
PLATELETS: 196 10*3/uL (ref 150–400)
RBC: 4.95 MIL/uL (ref 4.22–5.81)
RDW: 13.4 % (ref 11.5–15.5)
WBC: 6.3 10*3/uL (ref 4.0–10.5)

## 2013-09-18 LAB — ETHANOL: Alcohol, Ethyl (B): 294 mg/dL — ABNORMAL HIGH (ref 0–11)

## 2013-09-18 LAB — LIPASE, BLOOD: Lipase: 38 U/L (ref 11–59)

## 2013-09-18 MED ORDER — FENTANYL CITRATE 0.05 MG/ML IJ SOLN
50.0000 ug | Freq: Once | INTRAMUSCULAR | Status: AC
  Administered 2013-09-18: 50 ug via INTRAVENOUS
  Filled 2013-09-18: qty 2

## 2013-09-18 MED ORDER — FENTANYL CITRATE 0.05 MG/ML IJ SOLN
100.0000 ug | Freq: Once | INTRAMUSCULAR | Status: AC
  Administered 2013-09-19: 100 ug via INTRAVENOUS
  Filled 2013-09-18: qty 2

## 2013-09-18 MED ORDER — SODIUM CHLORIDE 0.9 % IV BOLUS (SEPSIS)
1000.0000 mL | Freq: Once | INTRAVENOUS | Status: AC
  Administered 2013-09-18: 1000 mL via INTRAVENOUS

## 2013-09-18 MED ORDER — DEXAMETHASONE SODIUM PHOSPHATE 10 MG/ML IJ SOLN
10.0000 mg | Freq: Once | INTRAMUSCULAR | Status: AC
  Administered 2013-09-18: 10 mg via INTRAVENOUS
  Filled 2013-09-18: qty 1

## 2013-09-18 MED ORDER — TETANUS-DIPHTH-ACELL PERTUSSIS 5-2.5-18.5 LF-MCG/0.5 IM SUSP
0.5000 mL | Freq: Once | INTRAMUSCULAR | Status: AC
  Administered 2013-09-18: 0.5 mL via INTRAMUSCULAR
  Filled 2013-09-18: qty 0.5
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
## 2013-09-18 NOTE — ED Notes (Signed)
Chaplain at bedside

## 2013-09-18 NOTE — ED Notes (Signed)
Family at beside. Family given emotional support. 

## 2013-09-18 NOTE — ED Notes (Signed)
MD at bedside. 

## 2013-09-18 NOTE — ED Notes (Signed)
EDP to consult oppthalmology
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
## 2013-09-18 NOTE — ED Notes (Signed)
CT called

## 2013-09-19 LAB — RAPID URINE DRUG SCREEN, HOSP PERFORMED
AMPHETAMINES: NOT DETECTED
Barbiturates: NOT DETECTED
Benzodiazepines: NOT DETECTED
Cocaine: NOT DETECTED
OPIATES: NOT DETECTED
TETRAHYDROCANNABINOL: NOT DETECTED

## 2013-09-19 MED ORDER — OXYCODONE-ACETAMINOPHEN 5-325 MG PO TABS: 1.0000 | ORAL_TABLET | ORAL | Status: DC | PRN

## 2013-09-19 NOTE — ED Notes (Addendum)
Both ENT and Opthalmology were consulted. They did not arrive. Provided d/c recommendations.

## 2013-09-19 NOTE — Discharge Instructions (Signed)
Facial Fracture °A facial fracture is a break in one of the bones of your face. °HOME CARE INSTRUCTIONS  °· Protect the injured part of your face until it is healed. °· Do not participate in activities which give chance for re-injury until your doctor approves. °· Gently wash and dry your face. °· Wear head and facial protection while riding a bicycle, motorcycle, or snowmobile. °SEEK MEDICAL CARE IF:  °· An oral temperature above 102° F (38.9° C) develops. °· You have severe headaches or notice changes in your vision. °· You have new numbness or tingling in your face. °· You develop nausea (feeling sick to your stomach), vomiting or a stiff neck. °SEEK IMMEDIATE MEDICAL CARE IF:  °· You develop difficulty seeing or experience double vision. °· You become dizzy, lightheaded, or faint. °· You develop trouble speaking, breathing, or swallowing. °· You have a watery discharge from your nose or ear. °MAKE SURE YOU:  °· Understand these instructions. °· Will watch your condition. °· Will get help right away if you are not doing well or get worse. °Document Released: 01/21/2005 Document Revised: 04/15/2011 Document Reviewed: 09/10/2007 °[employer]® Patient Information ©2015 [employer]. This information is not intended to replace advice given to you by your health care provider. Make sure you discuss any questions you have with your health care provider. ° ° ° °

## 2013-09-19 NOTE — ED Notes (Signed)
Family at bedside; EDP Resident at bedside to encourage family to keep pt. At their home until pt. Goes to eye clinic.
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
## 2013-09-23 ENCOUNTER — Encounter (HOSPITAL_COMMUNITY): Payer: Self-pay | Admitting: Emergency Medicine

## 2013-09-23 ENCOUNTER — Emergency Department (HOSPITAL_COMMUNITY): Payer: Self-pay

## 2013-09-23 ENCOUNTER — Emergency Department (HOSPITAL_COMMUNITY)
Admission: EM | Admit: 2013-09-23 | Discharge: 2013-09-23 | Disposition: A | Discharge status: Home / Self Care | Payer: Self-pay | Attending: Emergency Medicine | Admitting: Emergency Medicine

## 2013-09-23 DIAGNOSIS — F10920 Alcohol use, unspecified with intoxication, uncomplicated: Secondary | ICD-10-CM

## 2013-09-23 DIAGNOSIS — J45901 Unspecified asthma with (acute) exacerbation: Secondary | ICD-10-CM | POA: Insufficient documentation

## 2013-09-23 DIAGNOSIS — F17200 Nicotine dependence, unspecified, uncomplicated: Secondary | ICD-10-CM | POA: Insufficient documentation

## 2013-09-23 DIAGNOSIS — F1010 Alcohol abuse, uncomplicated: Secondary | ICD-10-CM | POA: Insufficient documentation

## 2013-09-23 DIAGNOSIS — Z8679 Personal history of other diseases of the circulatory system: Secondary | ICD-10-CM | POA: Insufficient documentation

## 2013-09-23 DIAGNOSIS — S023XXS Fracture of orbital floor, sequela: Secondary | ICD-10-CM

## 2013-09-23 DIAGNOSIS — Z79899 Other long term (current) drug therapy: Secondary | ICD-10-CM | POA: Insufficient documentation

## 2013-09-23 DIAGNOSIS — S0280XS Fracture of other specified skull and facial bones, unspecified side, sequela: Secondary | ICD-10-CM

## 2013-09-23 DIAGNOSIS — R51 Headache: Secondary | ICD-10-CM | POA: Insufficient documentation

## 2013-09-23 DIAGNOSIS — IMO0001 Reserved for inherently not codable concepts without codable children: Secondary | ICD-10-CM | POA: Insufficient documentation

## 2013-09-23 LAB — COMPREHENSIVE METABOLIC PANEL
ALBUMIN: 3.7 g/dL (ref 3.5–5.2)
ALK PHOS: 99 U/L (ref 39–117)
ALT: 21 U/L (ref 0–53)
AST: 17 U/L (ref 0–37)
Anion gap: 15 (ref 5–15)
BUN: 11 mg/dL (ref 6–23)
CALCIUM: 8.7 mg/dL (ref 8.4–10.5)
CO2: 22 mEq/L (ref 19–32)
Chloride: 108 mEq/L (ref 96–112)
Creatinine, Ser: 0.71 mg/dL (ref 0.50–1.35)
GFR calc Af Amer: >90 mL/min (ref >90)
GFR calc non Af Amer: >90 mL/min (ref >90)
Glucose, Bld: 116 mg/dL — ABNORMAL HIGH (ref 70–99)
Potassium: 3.6 mEq/L — ABNORMAL LOW (ref 3.7–5.3)
SODIUM: 145 meq/L (ref 137–147)
TOTAL PROTEIN: 6.8 g/dL (ref 6.0–8.3)
Total Bilirubin: 0.4 mg/dL (ref 0.3–1.2)

## 2013-09-23 LAB — CBC WITH DIFFERENTIAL/PLATELET
BASOS ABS: 0 10*3/uL (ref 0.0–0.1)
Basophils Relative: 0 % (ref 0–1)
EOS ABS: 0.1 10*3/uL (ref 0.0–0.7)
EOS PCT: 2 % (ref 0–5)
HCT: 42.1 % (ref 39.0–52.0)
Hemoglobin: 15.1 g/dL (ref 13.0–17.0)
LYMPHS ABS: 2.2 10*3/uL (ref 0.7–4.0)
Lymphocytes Relative: 46 % (ref 12–46)
MCH: 31.5 pg (ref 26.0–34.0)
MCHC: 35.9 g/dL (ref 30.0–36.0)
MCV: 87.7 fL (ref 78.0–100.0)
Monocytes Absolute: 0.4 10*3/uL (ref 0.1–1.0)
Monocytes Relative: 8 % (ref 3–12)
Neutro Abs: 2.1 10*3/uL (ref 1.7–7.7)
Neutrophils Relative %: 44 % (ref 43–77)
PLATELETS: 171 10*3/uL (ref 150–400)
RBC: 4.8 MIL/uL (ref 4.22–5.81)
RDW: 13.5 % (ref 11.5–15.5)
WBC: 4.8 10*3/uL (ref 4.0–10.5)

## 2013-09-23 LAB — ETHANOL: Alcohol, Ethyl (B): 118 mg/dL — ABNORMAL HIGH (ref 0–11)

## 2013-09-23 LAB — I-STAT TROPONIN, ED: TROPONIN I, POC: 0 ng/mL (ref 0.00–0.08)

## 2013-09-23 LAB — LIPASE, BLOOD: LIPASE: 37 U/L (ref 11–59)

## 2013-09-23 MED ORDER — SODIUM CHLORIDE 0.9 % IV BOLUS (SEPSIS)
1000.0000 mL | Freq: Once | INTRAVENOUS | Status: AC
  Administered 2013-09-23: 1000 mL via INTRAVENOUS

## 2013-09-23 MED ORDER — OXYCODONE-ACETAMINOPHEN 5-325 MG PO TABS
1.0000 | ORAL_TABLET | Freq: Once | ORAL | Status: AC
  Administered 2013-09-23: 1 via ORAL
  Filled 2013-09-23: qty 1

## 2013-09-23 MED ORDER — TETRACAINE HCL 0.5 % OP SOLN
1.0000 [drp] | Freq: Once | OPHTHALMIC | Status: AC
  Administered 2013-09-23: 1 [drp] via OPHTHALMIC
  Filled 2013-09-23: qty 2

## 2013-09-23 MED ORDER — OXYCODONE-ACETAMINOPHEN 5-325 MG PO TABS: 2.0000 | ORAL_TABLET | ORAL | Status: DC | PRN

## 2013-09-23 MED ORDER — MORPHINE SULFATE 4 MG/ML IJ SOLN
4.0000 mg | Freq: Once | INTRAMUSCULAR | Status: AC
  Administered 2013-09-23: 4 mg via INTRAVENOUS
  Filled 2013-09-23: qty 1
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
## 2013-09-23 NOTE — ED Notes (Signed)
Spoke with pharmacy re: tubing Tetracaine to POD A
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
## 2013-09-23 NOTE — ED Notes (Signed)
ENT MD at bedside.

## 2013-09-23 NOTE — ED Provider Notes (Signed)
I saw and evaluated the patient, reviewed the resident's note and I agree with the findings and plan.   EKG Interpretation None     Patient also saw. Has dilated pupil. Post septal hematoma. Discussed with ENT and ophthalmology. Does not need emergent admission. We'll follow in the office tomorrow.  Juliet RudeNathan R. Rubin PayorPickering, MD 09/23/13 [phone]

## 2013-09-23 NOTE — ED Notes (Signed)
Talked to pharmacy regarding Pontocaine drop, pharmacy to send tech with drops.
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
## 2013-09-23 NOTE — Discharge Instructions (Signed)
Return to the emergency room with worsening of symptoms or with symptoms that are concerning, especially worsening severe HA, visual changes, vomiting. Follow up with ENT Dr. Jenne PaneBates on Monday. [phone] [phone].
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
## 2013-09-24 NOTE — Discharge Planning (Signed)
[facility] Community Liaison  Spoke to patient regarding primary care resources and establishing care with a provider. Resource guide and my contact information provided. No other Community Liaison needs identified at this time.

## 2013-09-25 NOTE — ED Provider Notes (Signed)
Medical screening examination/treatment/procedure(s) were performed by non-physician practitioner and as supervising physician I was immediately available for consultation/collaboration.   EKG Interpretation   Date/Time:  Thursday September 23 2013 07:27:11 EDT Ventricular Rate:  78 PR Interval:  141 QRS Duration: 89 QT Interval:  382 QTC Calculation: 435 R Axis:   38 Text Interpretation:  Sinus rhythm Artifact When compared with ECG of  04/15/2013 No significant change was found Confirmed by MCCMANUS  MD,  Dae Antonucci ([id number]) on 09/23/2013 9:26:07 AM        Jacob Baum, DO 09/25/13 1556 

## 2013-09-25 NOTE — ED Provider Notes (Signed)
Medical screening examination/treatment/procedure(s) were performed by non-physician practitioner and as supervising physician I was immediately available for consultation/collaboration.   EKG Interpretation   Date/Time:  Thursday September 23 2013 07:27:11 EDT Ventricular Rate:  78 PR Interval:  141 QRS Duration: 89 QT Interval:  382 QTC Calculation: 435 R Axis:   38 Text Interpretation:  Sinus rhythm Artifact When compared with ECG of  04/15/2013 No significant change was found Confirmed by [facility]CC[facility]US  MD,  Kahliyah Dick ([id number]) on 09/23/2013 9:26:07 AM        Samuel JesterKathleen Shantara Goosby, DO 09/25/13 1556

## 2013-09-30 ENCOUNTER — Encounter (HOSPITAL_COMMUNITY): Payer: Self-pay | Admitting: *Deleted

## 2013-10-01 ENCOUNTER — Observation Stay (HOSPITAL_COMMUNITY)
Admission: RE | Admit: 2013-10-01 | Discharge: 2013-10-02 | Disposition: A | Discharge status: Home / Self Care | Payer: Self-pay | Source: Ambulatory Visit | Attending: Otolaryngology | Admitting: Otolaryngology

## 2013-10-01 ENCOUNTER — Encounter (HOSPITAL_COMMUNITY): Payer: Self-pay | Admitting: *Deleted

## 2013-10-01 ENCOUNTER — Encounter (HOSPITAL_COMMUNITY): Payer: Self-pay | Admitting: Anesthesiology

## 2013-10-01 ENCOUNTER — Ambulatory Visit (HOSPITAL_COMMUNITY): Payer: Self-pay | Admitting: Anesthesiology

## 2013-10-01 ENCOUNTER — Encounter (HOSPITAL_COMMUNITY)
Admission: RE | Disposition: A | Discharge status: Home / Self Care | Payer: Self-pay | Source: Ambulatory Visit | Attending: Otolaryngology

## 2013-10-01 DIAGNOSIS — K219 Gastro-esophageal reflux disease without esophagitis: Secondary | ICD-10-CM | POA: Insufficient documentation

## 2013-10-01 DIAGNOSIS — F17200 Nicotine dependence, unspecified, uncomplicated: Secondary | ICD-10-CM | POA: Insufficient documentation

## 2013-10-01 DIAGNOSIS — Y998 Other external cause status: Secondary | ICD-10-CM | POA: Insufficient documentation

## 2013-10-01 DIAGNOSIS — S0230XA Fracture of orbital floor, unspecified side, initial encounter for closed fracture: Principal | ICD-10-CM | POA: Diagnosis present

## 2013-10-01 DIAGNOSIS — J45909 Unspecified asthma, uncomplicated: Secondary | ICD-10-CM | POA: Insufficient documentation

## 2013-10-01 DIAGNOSIS — F411 Generalized anxiety disorder: Secondary | ICD-10-CM | POA: Insufficient documentation

## 2013-10-01 DIAGNOSIS — Z9089 Acquired absence of other organs: Secondary | ICD-10-CM | POA: Insufficient documentation

## 2013-10-01 DIAGNOSIS — Z800 Family history of malignant neoplasm of digestive organs: Secondary | ICD-10-CM | POA: Insufficient documentation

## 2013-10-01 DIAGNOSIS — G4730 Sleep apnea, unspecified: Secondary | ICD-10-CM | POA: Insufficient documentation

## 2013-10-01 HISTORY — PX: NASAL SINUS SURGERY: SHX719

## 2013-10-01 HISTORY — DX: Anxiety disorder, unspecified: F41.9

## 2013-10-01 HISTORY — DX: Pneumonia, unspecified organism: J18.9

## 2013-10-01 HISTORY — DX: Gastro-esophageal reflux disease without esophagitis: K21.9

## 2013-10-01 LAB — COMPREHENSIVE METABOLIC PANEL
ALT: 24 U/L (ref 0–53)
ANION GAP: 13 (ref 5–15)
AST: 15 U/L (ref 0–37)
Albumin: 4 g/dL (ref 3.5–5.2)
Alkaline Phosphatase: 114 U/L (ref 39–117)
BILIRUBIN TOTAL: 0.5 mg/dL (ref 0.3–1.2)
BUN: 13 mg/dL (ref 6–23)
CHLORIDE: 107 meq/L (ref 96–112)
CO2: 23 meq/L (ref 19–32)
CREATININE: 0.78 mg/dL (ref 0.50–1.35)
Calcium: 9 mg/dL (ref 8.4–10.5)
Glucose, Bld: 105 mg/dL — ABNORMAL HIGH (ref 70–99)
Potassium: 4.3 mEq/L (ref 3.7–5.3)
Sodium: 143 mEq/L (ref 137–147)
Total Protein: 7.3 g/dL (ref 6.0–8.3)

## 2013-10-01 LAB — CBC
HCT: 46.8 % (ref 39.0–52.0)
Hemoglobin: 16.4 g/dL (ref 13.0–17.0)
MCH: 31.8 pg (ref 26.0–34.0)
MCHC: 35 g/dL (ref 30.0–36.0)
MCV: 90.9 fL (ref 78.0–100.0)
Platelets: 180 10*3/uL (ref 150–400)
RBC: 5.15 MIL/uL (ref 4.22–5.81)
RDW: 13.4 % (ref 11.5–15.5)
WBC: 6.7 10*3/uL (ref 4.0–10.5)

## 2013-10-01 SURGERY — SINUS SURGERY, ENDOSCOPIC
Anesthesia: General | Site: Mouth | Laterality: Right

## 2013-10-01 MED ORDER — MIDAZOLAM HCL 2 MG/2ML IJ SOLN
INTRAMUSCULAR | Status: AC
  Filled 2013-10-01: qty 2

## 2013-10-01 MED ORDER — NEOSTIGMINE METHYLSULFATE 10 MG/10ML IV SOLN
INTRAVENOUS | Status: DC | PRN
  Administered 2013-10-01: 3 mg via INTRAVENOUS

## 2013-10-01 MED ORDER — MORPHINE SULFATE 2 MG/ML IJ SOLN
2.0000 mg | INTRAMUSCULAR | Status: DC | PRN
  Administered 2013-10-01: 4 mg via INTRAVENOUS
  Administered 2013-10-01 – 2013-10-02 (×2): 2 mg via INTRAVENOUS
  Administered 2013-10-02 (×4): 4 mg via INTRAVENOUS
  Filled 2013-10-01: qty 2
  Filled 2013-10-01 (×2): qty 1
  Filled 2013-10-01 (×4): qty 2

## 2013-10-01 MED ORDER — LACTATED RINGERS IV SOLN
INTRAVENOUS | Status: DC | PRN
  Administered 2013-10-01 (×2): via INTRAVENOUS

## 2013-10-01 MED ORDER — GLYCOPYRROLATE 0.2 MG/ML IJ SOLN
INTRAMUSCULAR | Status: DC | PRN
  Administered 2013-10-01: .4 mg via INTRAVENOUS

## 2013-10-01 MED ORDER — OXYCODONE HCL 5 MG/5ML PO SOLN: 5.0000 mg | Freq: Once | ORAL | Status: DC | PRN

## 2013-10-01 MED ORDER — LIDOCAINE HCL (CARDIAC) 20 MG/ML IV SOLN
INTRAVENOUS | Status: DC | PRN
  Administered 2013-10-01: 30 mg via INTRAVENOUS

## 2013-10-01 MED ORDER — FENTANYL CITRATE 0.05 MG/ML IJ SOLN
INTRAMUSCULAR | Status: AC
  Filled 2013-10-01: qty 5

## 2013-10-01 MED ORDER — LIDOCAINE-EPINEPHRINE 1 %-1:100000 IJ SOLN
INTRAMUSCULAR | Status: AC
  Filled 2013-10-01: qty 1

## 2013-10-01 MED ORDER — ONDANSETRON HCL 4 MG/2ML IJ SOLN
INTRAMUSCULAR | Status: DC | PRN
  Administered 2013-10-01: 4 mg via INTRAVENOUS

## 2013-10-01 MED ORDER — HYDROMORPHONE HCL PF 1 MG/ML IJ SOLN: 0.2500 mg | INTRAMUSCULAR | Status: DC | PRN

## 2013-10-01 MED ORDER — KCL IN DEXTROSE-NACL 20-5-0.45 MEQ/L-%-% IV SOLN
INTRAVENOUS | Status: DC
  Administered 2013-10-01: 16:00:00 via INTRAVENOUS
  Filled 2013-10-01 (×5): qty 1000

## 2013-10-01 MED ORDER — MIDAZOLAM HCL 5 MG/5ML IJ SOLN
INTRAMUSCULAR | Status: DC | PRN
  Administered 2013-10-01: 2 mg via INTRAVENOUS

## 2013-10-01 MED ORDER — PROMETHAZINE HCL 12.5 MG PO TABS
12.5000 mg | ORAL_TABLET | Freq: Four times a day (QID) | ORAL | Status: DC | PRN
  Administered 2013-10-02: 12.5 mg via ORAL
  Filled 2013-10-01: qty 1

## 2013-10-01 MED ORDER — MIDAZOLAM HCL 2 MG/2ML IJ SOLN: 0.5000 mg | Freq: Once | INTRAMUSCULAR | Status: DC | PRN

## 2013-10-01 MED ORDER — NEOSTIGMINE METHYLSULFATE 10 MG/10ML IV SOLN
INTRAVENOUS | Status: AC
  Filled 2013-10-01: qty 1

## 2013-10-01 MED ORDER — HYDROCODONE-ACETAMINOPHEN 5-325 MG PO TABS
1.0000 | ORAL_TABLET | ORAL | Status: DC | PRN
  Administered 2013-10-01 – 2013-10-02 (×4): 2 via ORAL
  Filled 2013-10-01 (×4): qty 2

## 2013-10-01 MED ORDER — OXYMETAZOLINE HCL 0.05 % NA SOLN
NASAL | Status: AC
  Filled 2013-10-01: qty 15

## 2013-10-01 MED ORDER — EPHEDRINE SULFATE 50 MG/ML IJ SOLN
INTRAMUSCULAR | Status: DC | PRN
  Administered 2013-10-01: 10 mg via INTRAVENOUS

## 2013-10-01 MED ORDER — GLYCOPYRROLATE 0.2 MG/ML IJ SOLN
INTRAMUSCULAR | Status: AC
  Filled 2013-10-01: qty 2

## 2013-10-01 MED ORDER — PHENYLEPHRINE HCL 10 MG/ML IJ SOLN
INTRAMUSCULAR | Status: DC | PRN
  Administered 2013-10-01 (×2): 80 ug via INTRAVENOUS

## 2013-10-01 MED ORDER — ONDANSETRON HCL 4 MG/2ML IJ SOLN
INTRAMUSCULAR | Status: AC
  Filled 2013-10-01: qty 2

## 2013-10-01 MED ORDER — HYDROCODONE-ACETAMINOPHEN 5-325 MG PO TABS: 1.0000 | ORAL_TABLET | ORAL | Status: DC | PRN

## 2013-10-01 MED ORDER — MEPERIDINE HCL 25 MG/ML IJ SOLN: 6.2500 mg | INTRAMUSCULAR | Status: DC | PRN

## 2013-10-01 MED ORDER — BACITRACIN ZINC 500 UNIT/GM EX OINT
TOPICAL_OINTMENT | CUTANEOUS | Status: AC
  Filled 2013-10-01: qty 15

## 2013-10-01 MED ORDER — ALBUTEROL SULFATE (2.5 MG/3ML) 0.083% IN NEBU: 3.0000 mL | INHALATION_SOLUTION | Freq: Four times a day (QID) | RESPIRATORY_TRACT | Status: DC | PRN

## 2013-10-01 MED ORDER — CEFAZOLIN SODIUM-DEXTROSE 2-3 GM-% IV SOLR
INTRAVENOUS | Status: DC | PRN
  Administered 2013-10-01: 2 g via INTRAVENOUS

## 2013-10-01 MED ORDER — PROPOFOL 10 MG/ML IV BOLUS
INTRAVENOUS | Status: DC | PRN
  Administered 2013-10-01: 200 mg via INTRAVENOUS

## 2013-10-01 MED ORDER — FENTANYL CITRATE 0.05 MG/ML IJ SOLN
INTRAMUSCULAR | Status: DC | PRN
  Administered 2013-10-01: 100 ug via INTRAVENOUS
  Administered 2013-10-01 (×3): 50 ug via INTRAVENOUS
  Administered 2013-10-01: 150 ug via INTRAVENOUS

## 2013-10-01 MED ORDER — PROPOFOL 10 MG/ML IV BOLUS
INTRAVENOUS | Status: AC
  Filled 2013-10-01: qty 20

## 2013-10-01 MED ORDER — PROMETHAZINE HCL 25 MG/ML IJ SOLN: 6.2500 mg | INTRAMUSCULAR | Status: DC | PRN

## 2013-10-01 MED ORDER — PROMETHAZINE HCL 12.5 MG RE SUPP
12.5000 mg | Freq: Four times a day (QID) | RECTAL | Status: DC | PRN
  Filled 2013-10-01: qty 1

## 2013-10-01 MED ORDER — LIDOCAINE HCL (CARDIAC) 20 MG/ML IV SOLN
INTRAVENOUS | Status: AC
  Filled 2013-10-01: qty 5

## 2013-10-01 MED ORDER — ROCURONIUM BROMIDE 100 MG/10ML IV SOLN
INTRAVENOUS | Status: DC | PRN
  Administered 2013-10-01: 40 mg via INTRAVENOUS

## 2013-10-01 MED ORDER — LACTATED RINGERS IV SOLN
INTRAVENOUS | Status: DC
  Administered 2013-10-01: 50 mL/h via INTRAVENOUS

## 2013-10-01 MED ORDER — OXYCODONE HCL 5 MG PO TABS: 5.0000 mg | ORAL_TABLET | Freq: Once | ORAL | Status: DC | PRN

## 2013-10-01 MED ORDER — LIDOCAINE-EPINEPHRINE 1 %-1:100000 IJ SOLN
INTRAMUSCULAR | Status: DC | PRN
  Administered 2013-10-01: 2.5 mL

## 2013-10-01 SURGICAL SUPPLY — 39 items
BLADE SURG ROTATE 9660 (MISCELLANEOUS) IMPLANT
BUR CROSS CUT FISSURE 1.6 (BURR) ×6 IMPLANT
BUR CROSS CUT FISSURE 1.6MM (BURR) ×2
CANISTER SUCTION 2500CC (MISCELLANEOUS) ×4 IMPLANT
CLEANER TIP ELECTROSURG 2X2 (MISCELLANEOUS) ×4 IMPLANT
CONFORMERS SILICONE 5649 (OPHTHALMIC RELATED) IMPLANT
COVER SURGICAL LIGHT HANDLE (MISCELLANEOUS) ×4 IMPLANT
CRADLE DONUT ADULT HEAD (MISCELLANEOUS) IMPLANT
DECANTER SPIKE VIAL GLASS SM (MISCELLANEOUS) ×4 IMPLANT
ELECT COATED BLADE 2.86 ST (ELECTRODE) ×4 IMPLANT
ELECT REM PT RETURN 9FT ADLT (ELECTROSURGICAL) ×4
ELECTRODE REM PT RTRN 9FT ADLT (ELECTROSURGICAL) ×2 IMPLANT
GLOVE BIO SURGEON STRL SZ7.5 (GLOVE) ×4 IMPLANT
GOWN STRL REUS W/ TWL LRG LVL3 (GOWN DISPOSABLE) ×4 IMPLANT
GOWN STRL REUS W/TWL LRG LVL3 (GOWN DISPOSABLE) ×4
IMPL BARRIER ORB 38X50X1.0 (Mesh General) ×2 IMPLANT
IMPLANT BARRIER ORB 38X50X1.0 (Mesh General) ×4 IMPLANT
KIT BASIN OR (CUSTOM PROCEDURE TRAY) ×4 IMPLANT
KIT ROOM TURNOVER OR (KITS) ×4 IMPLANT
NEEDLE 27GAX1X1/2 (NEEDLE) ×4 IMPLANT
NS IRRIG 1000ML POUR BTL (IV SOLUTION) ×4 IMPLANT
PAD ARMBOARD 7.5X6 YLW CONV (MISCELLANEOUS) ×8 IMPLANT
PENCIL BUTTON HOLSTER BLD 10FT (ELECTRODE) ×4 IMPLANT
SCISSORS WIRE ANG 4 3/4 DISP (INSTRUMENTS) ×4 IMPLANT
SOLUTION ANTI FOG 6CC (MISCELLANEOUS) ×4 IMPLANT
STRYKER 4.0MM EGG BUR MEDIUM ×4 IMPLANT
SUT ETHILON 4 0 CL P 3 (SUTURE) IMPLANT
SUT MON AB 3-0 SH 27 (SUTURE) ×2
SUT MON AB 3-0 SH27 (SUTURE) ×2 IMPLANT
SUT PROLENE 6 0 PC 1 (SUTURE) IMPLANT
SUT STEEL 0 (SUTURE)
SUT STEEL 0 18XMFL TIE 17 (SUTURE) IMPLANT
SUT STEEL 2 (SUTURE) IMPLANT
SUT VICRYL 4-0 PS2 18IN ABS (SUTURE) IMPLANT
TOWEL OR 17X24 6PK STRL BLUE (TOWEL DISPOSABLE) ×4 IMPLANT
TOWEL OR 17X26 10 PK STRL BLUE (TOWEL DISPOSABLE) ×4 IMPLANT
TRAY ENT MC OR (CUSTOM PROCEDURE TRAY) ×4 IMPLANT
TRAY FOLEY CATH 14FRSI W/METER (CATHETERS) IMPLANT
WATER STERILE IRR 1000ML POUR (IV SOLUTION) ×4 IMPLANT

## 2013-10-01 NOTE — Anesthesia Preprocedure Evaluation (Addendum)
Anesthesia Evaluation  Patient identified by MRN, date of birth, ID band Patient awake    Reviewed: Allergy & Precautions, H&P , NPO status , Patient's Chart, lab work & pertinent test results  History of Anesthesia Complications Negative for: history of anesthetic complications  Airway Mallampati: II TM Distance: >3 FB Neck ROM: Full    Dental  (+) Poor Dentition, Missing, Chipped, Dental Advisory Given   Pulmonary asthma , sleep apnea (does not use CPAP) , COPD (last inhaler use a few weeks ago) COPD inhaler, Current Smoker,  breath sounds clear to auscultation        Cardiovascular - anginanegative cardio ROS  Rhythm:Regular Rate:Normal     Neuro/Psych    GI/Hepatic GERD-  Controlled,(+)     substance abuse  alcohol use,   Endo/Other  negative endocrine ROS  Renal/GU negative Renal ROS     Musculoskeletal   Abdominal   Peds  Hematology   Anesthesia Other Findings   Reproductive/Obstetrics                          Anesthesia Physical Anesthesia Plan  ASA: III  Anesthesia Plan: General   Post-op Pain Management:    Induction: Intravenous  Airway Management Planned: Oral ETT  Additional Equipment:   Intra-op Plan:   Post-operative Plan: Extubation in OR  Informed Consent: I have reviewed the patients History and Physical, chart, labs and discussed the procedure including the risks, benefits and alternatives for the proposed anesthesia with the patient or authorized representative who has indicated his/her understanding and acceptance.   Dental advisory given  Plan Discussed with: CRNA and Surgeon  Anesthesia Plan Comments: (Plan routine monitors, GETA)        Anesthesia Quick Evaluation
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
## 2013-10-01 NOTE — Brief Op Note (Signed)
10/01/2013  1:45 PM  PATIENT:  Jacob Gaines  48 y.o. male  PRE-OPERATIVE DIAGNOSIS:  right orbital floor fracture  POST-OPERATIVE DIAGNOSIS:  right orbital floor fracture  PROCEDURE:  Procedure(s): ENDOSCOPIC Transantral Repair of Right Orbital Floor Fracture  (Right)  SURGEON:  Surgeon(s) and Role:    * Christia Reading, MD - Primary  PHYSICIAN ASSISTANT:   ASSISTANTS: none   ANESTHESIA:   general  EBL:  Total I/O In: 1000 [I.V.:1000] Out: -   BLOOD ADMINISTERED:none  DRAINS: none   LOCAL MEDICATIONS USED:  LIDOCAINE   SPECIMEN:  No Specimen  DISPOSITION OF SPECIMEN:  N/A  COUNTS:  YES  TOURNIQUET:  * No tourniquets in log *  DICTATION: .Other Dictation: Dictation Number [id number]  PLAN OF CARE: Discharge to home after PACU  PATIENT DISPOSITION:  PACU - hemodynamically stable.   Delay start of Pharmacological VTE agent (>24hrs) due to surgical blood loss or risk of bleeding: no

## 2013-10-01 NOTE — Anesthesia Postprocedure Evaluation (Signed)
  Anesthesia Post-op Note  Patient: Jacob Gaines  Procedure(s) Performed: Procedure(s): ENDOSCOPIC Transantral Repair of Right Orbital Floor Fracture  (Right)  Patient Location: PACU  Anesthesia Type:General  Level of Consciousness: awake, alert , oriented and patient cooperative  Airway and Oxygen Therapy: Patient Spontanous Breathing and Patient connected to nasal cannula oxygen  Post-op Pain: mild  Post-op Assessment: Post-op Vital signs reviewed, Patient's Cardiovascular Status Stable, Respiratory Function Stable, Patent Airway, No signs of Nausea or vomiting and Pain level controlled  Post-op Vital Signs: Reviewed and stable  Last Vitals:  Filed Vitals:   10/01/13 1430  BP: 140/88  Pulse: 66  Temp:   Resp: 18    Complications: No apparent anesthesia complications

## 2013-10-01 NOTE — Progress Notes (Signed)
10/01/2013 6:27 PM  Jacob Gaines [id number]  Post-Op Check   Temp:  [97.8 F (36.6 C)-98.5 F (36.9 C)] 98 F (36.7 C) (08/28 1513) Pulse Rate:  [64-105] 72 (08/28 1513) Resp:  [17-20] 17 (08/28 1513) BP: (136-148)/(81-89) 136/82 mmHg (08/28 1513) SpO2:  [92 %-100 %] 100 % (08/28 1513) Weight:  [73.392 kg (161 lb 12.8 oz)] 73.392 kg (161 lb 12.8 oz) (08/28 0939),     Intake/Output Summary (Last 24 hours) at 10/01/13 1827 Last data filed at 10/01/13 1400  Gross per 24 hour  Intake   1400 ml  Output      0 ml  Net   1400 ml    Results for orders placed during the hospital encounter of 10/01/13 (from the past 24 hour(s))  CBC     Status: None   Collection Time    10/01/13  9:42 AM      Result Value Ref Range   WBC 6.7  4.0 - 10.5 K/uL   RBC 5.15  4.22 - 5.81 MIL/uL   Hemoglobin 16.4  13.0 - 17.0 g/dL   HCT 78.2  95.6 - 21.3 %   MCV 90.9  78.0 - 100.0 fL   MCH 31.8  26.0 - 34.0 pg   MCHC 35.0  30.0 - 36.0 g/dL   RDW 08.6  57.8 - 46.9 %   Platelets 180  150 - 400 K/uL  COMPREHENSIVE METABOLIC PANEL     Status: Abnormal   Collection Time    10/01/13  9:42 AM      Result Value Ref Range   Sodium 143  137 - 147 mEq/L   Potassium 4.3  3.7 - 5.3 mEq/L   Chloride 107  96 - 112 mEq/L   CO2 23  19 - 32 mEq/L   Glucose, Bld 105 (*) 70 - 99 mg/dL   BUN 13  6 - 23 mg/dL   Creatinine, Ser 6.29  0.50 - 1.35 mg/dL   Calcium 9.0  8.4 - 52.8 mg/dL   Total Protein 7.3  6.0 - 8.3 g/dL   Albumin 4.0  3.5 - 5.2 g/dL   AST 15  0 - 37 U/L   ALT 24  0 - 53 U/L   Alkaline Phosphatase 114  39 - 117 U/L   Total Bilirubin 0.5  0.3 - 1.2 mg/dL   GFR calc non Af Amer >90  >90 mL/min   GFR calc Af Amer >90  >90 mL/min   Anion gap 13  5 - 15    SUBJECTIVE:  lg pain.  Vision subjectively intact.  Breathing OK  OBJECTIVE:  Min facial swelling.  Counts fingers OD.  IMPRESSION:  Satisfactory check  PLAN:   Ice, elevation. Avoid nose blowing for 2 weeks.  Record vision checks on chart  please.  [name]

## 2013-10-01 NOTE — Transfer of Care (Signed)
Immediate Anesthesia Transfer of Care Note  Patient: Jacob Gaines  Procedure(s) Performed: Procedure(s): ENDOSCOPIC Transantral Repair of Right Orbital Floor Fracture  (Right)  Patient Location: PACU  Anesthesia Type:General  Level of Consciousness: awake, alert , oriented and patient cooperative  Airway & Oxygen Therapy: Patient Spontanous Breathing and Patient connected to nasal cannula oxygen  Post-op Assessment: Report given to PACU RN, Post -op Vital signs reviewed and stable and Patient moving all extremities X 4  Post vital signs: Reviewed and stable  Complications: No apparent anesthesia complications

## 2013-10-02 MED ORDER — HYDROCODONE-ACETAMINOPHEN 5-325 MG PO TABS: 1.0000 | ORAL_TABLET | ORAL | Status: DC | PRN

## 2013-10-02 MED ORDER — CEPHALEXIN 500 MG PO CAPS: 500.0000 mg | ORAL_CAPSULE | Freq: Four times a day (QID) | ORAL | Status: DC

## 2013-10-02 NOTE — Discharge Instructions (Signed)
No strenuous activity x 10 days. No nose blowing x 2 weeks Soft diet Rinse your mouth with gentle salt water after meals You may brush your teeth gently Keep your head elevated for 3-4 nights Call for worsening facial swelling, any vision changes ([phone]) Facial Fracture A facial fracture is a break in one of the bones of your face. HOME CARE INSTRUCTIONS   Protect the injured part of your face until it is healed.  Do not participate in activities which give chance for re-injury until your doctor approves.  Gently wash and dry your face.  Wear head and facial protection while riding a bicycle, motorcycle, or snowmobile. SEEK MEDICAL CARE IF:   An oral temperature above 102 F (38.9 C) develops.  You have severe headaches or notice changes in your vision.  You have new numbness or tingling in your face.  You develop nausea (feeling sick to your stomach), vomiting or a stiff neck. SEEK IMMEDIATE MEDICAL CARE IF:   You develop difficulty seeing or experience double vision.  You become dizzy, lightheaded, or faint.  You develop trouble speaking, breathing, or swallowing.  You have a watery discharge from your nose or ear. MAKE SURE YOU:   Understand these instructions.  Will watch your condition.  Will get help right away if you are not doing well or get worse. Document Released: 01/21/2005 Document Revised: 04/15/2011 Document Reviewed: 09/10/2007 [facility] Patient Information 2015 [place], [place]. This information is not intended to replace advice given to you by your health care provider. Make sure you discuss any questions you have with your health care provider.

## 2013-10-02 NOTE — Progress Notes (Signed)
Discharge instructions and prescriptions given to patient. IV removed per order. Patient verbalized understanding of instructions. Taken to lobby via wheelchair to meet sister for ride home. Trina Ao, RN
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
## 2013-10-02 NOTE — Progress Notes (Signed)
Clinical Education officer, museum (CSW) received call per RN that patient needs a bus pass home. Per RN patient is medically stable to ride the bus. CSW met with patient and gave him bus pass. Patient reported that he lives in [place], is familiar with the bus routes, and knows where the bus stop is at. Please reconsult if future social work needs arise. CSW signing off.   Blima Rich, [place] [employer] CSW [phone]

## 2013-10-02 NOTE — Progress Notes (Signed)
UR completed 

## 2013-10-02 NOTE — Discharge Summary (Signed)
  10/02/2013 10:54 AM  Jacob Gaines [id number]  Post-Op Day 1    Temp:  [98 F (36.7 C)-98.5 F (36.9 C)] 98.3 F (36.8 C) (08/29 0917) Pulse Rate:  [57-105] 72 (08/29 0917) Resp:  [16-20] 16 (08/29 0917) BP: (116-148)/(71-89) 118/77 mmHg (08/29 0917) SpO2:  [92 %-100 %] 95 % (08/29 0917),     Intake/Output Summary (Last 24 hours) at 10/02/13 1054 Last data filed at 10/02/13 0917  Gross per 24 hour  Intake   1880 ml  Output   1250 ml  Net    630 ml    No results found for this or any previous visit (from the past 24 hour(s)).  SUBJECTIVE:  Mod pain, controlled. Vision OK.  Spont void.  No SOB, chest pain  OBJECTIVE:  Min facial swelling.  Vision intact to fingers OU.   IMPRESSION:  Satisfactory check  PLAN:  Ice, elevation, analgesia, antibiosis.  No nose blowing x 2 weeks.  Return to work 10 days.  Recheck Dr. Jenne Pane 1 week  Admit: 28 AUG Discharge:  29 AUG Final Diagnosis:  RIGHT orbital blowout fracture Proc:  Trans antral repair RIGHT orbital blowout fracture Comp: none Cond: ambulatory.  Pain controlled. Vision good. Rx's:  Keflex, Vicodin Instructions written and given  Hosp Course:  Underwent repair under anesthesia, RIGHT orbital blow out fx.  Pain controlled. Spont void.  Ambulating without difficulty.  Vision checks intact.  On POD 1 discharged to home and care of family.    [name]

## 2013-10-06 ENCOUNTER — Encounter (HOSPITAL_COMMUNITY): Payer: Self-pay | Admitting: Otolaryngology

## 2013-12-21 IMAGING — CT CT ABD-PELV W/ CM
1 of 3 series · 14 of 32 positions shown, 19 images · IV contrast (OMNIPAQUE 300)
Comparison: 02/15/2007.

CLINICAL DATA: Abdominal pain.

CT ABDOMEN AND PELVIS WITH CONTRAST
TECHNIQUE: Multidetector CT imaging of the abdomen and pelvis was
performed following the standard protocol during bolus
administration of intravenous contrast.
Contrast: 50mL OMNIPAQUE IOHEXOL 300 MG/ML  SOLN, 100mL OMNIPAQUE
IOHEXOL 300 MG/ML  SOLN

[Series 2: abd/pel with · axial · 0.81mm/px · z∈[-442,-46]mm · 14 of 89 slices shown, 19 images]
[im 5/89  soft-tissue]
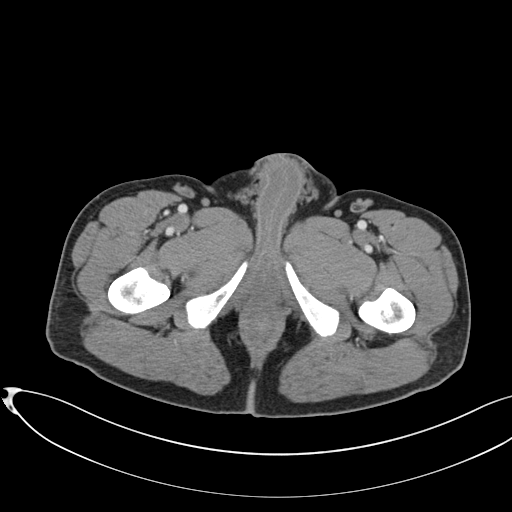
[im 5/89  bone]
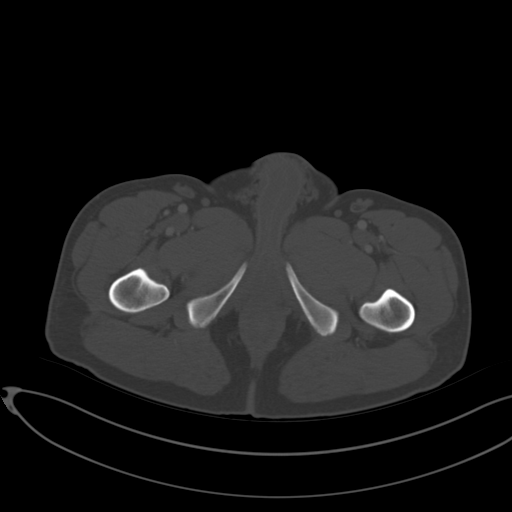
[im 14/89  soft-tissue]
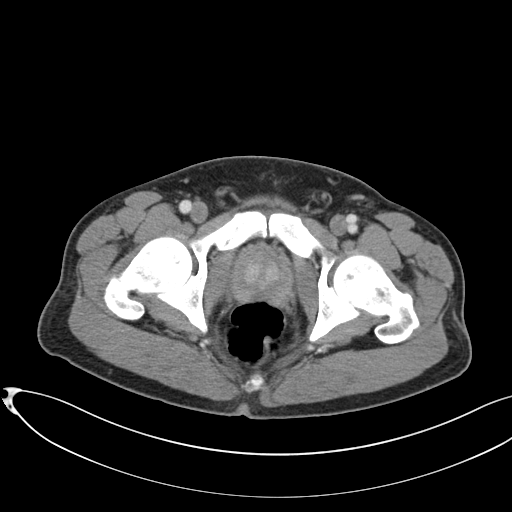
[im 19/89  soft-tissue]
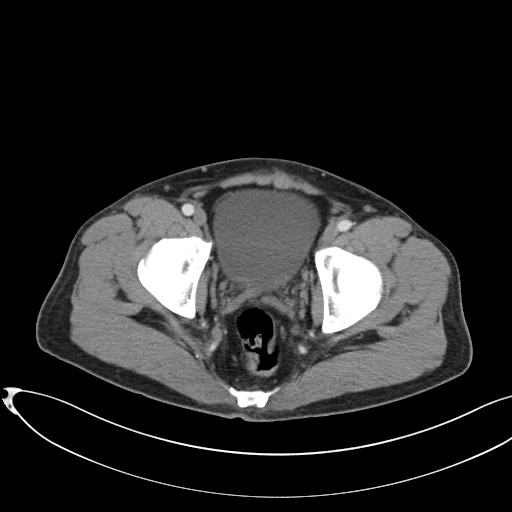
[im 24/89  soft-tissue]
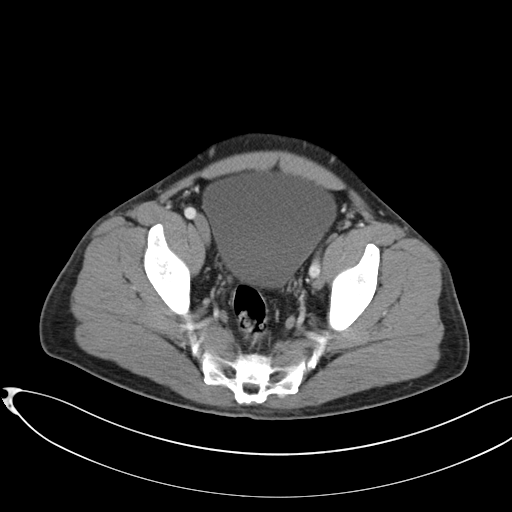
[im 33/89  soft-tissue]
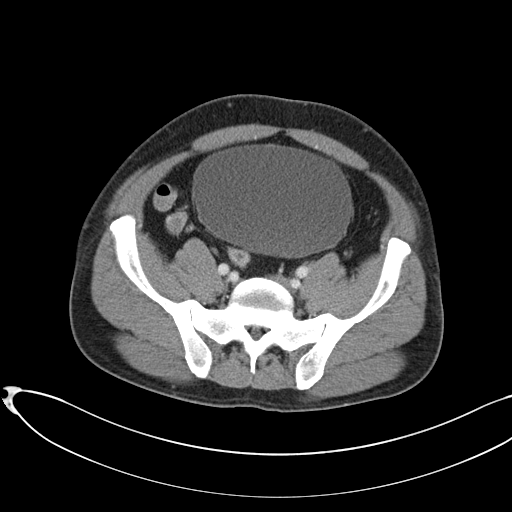
[im 38/89  soft-tissue]
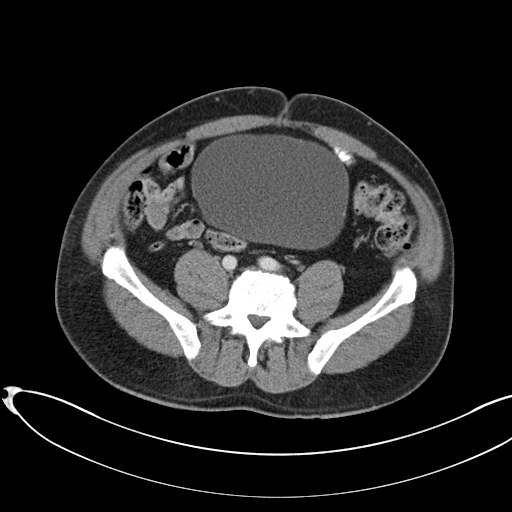
[im 47/89  soft-tissue]
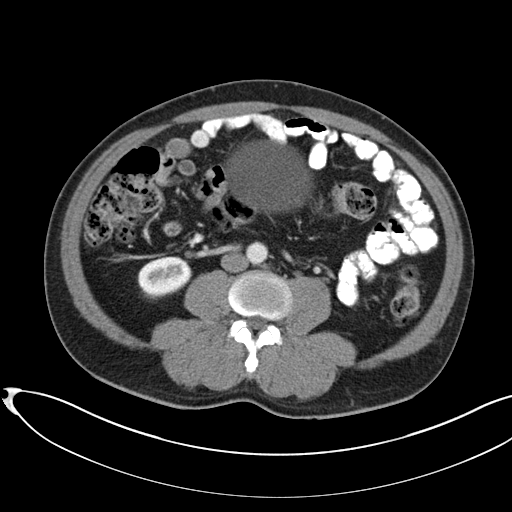
[im 51/89  soft-tissue]
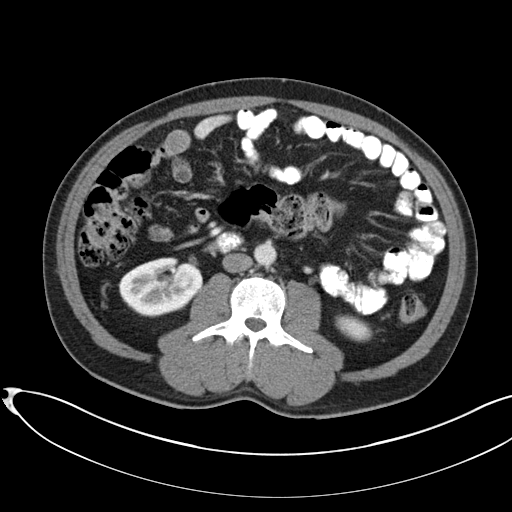
[im 56/89  soft-tissue]
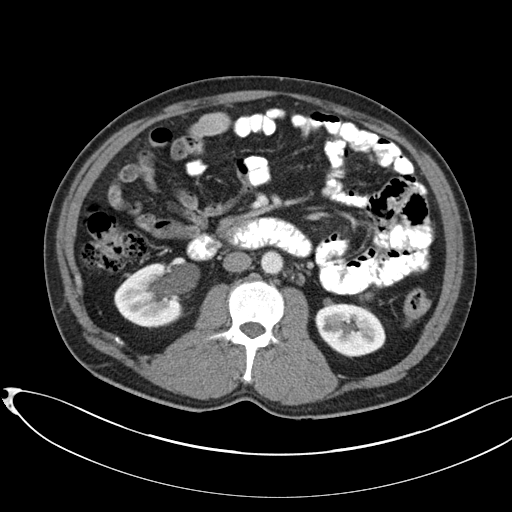
[im 56/89  bone]
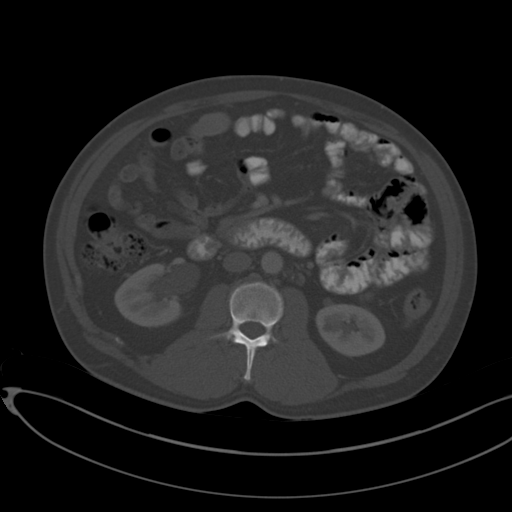
[im 65/89  soft-tissue]
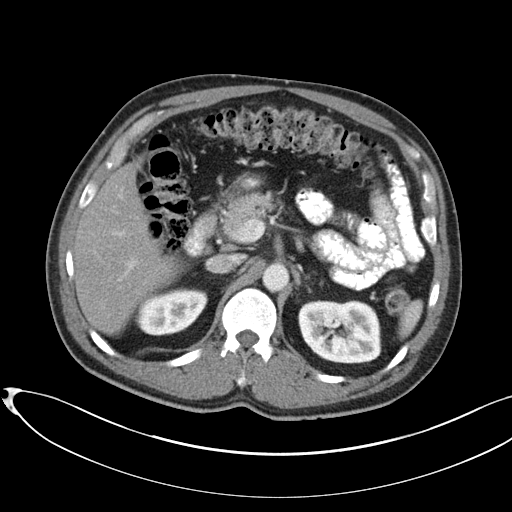
[im 70/89  soft-tissue]
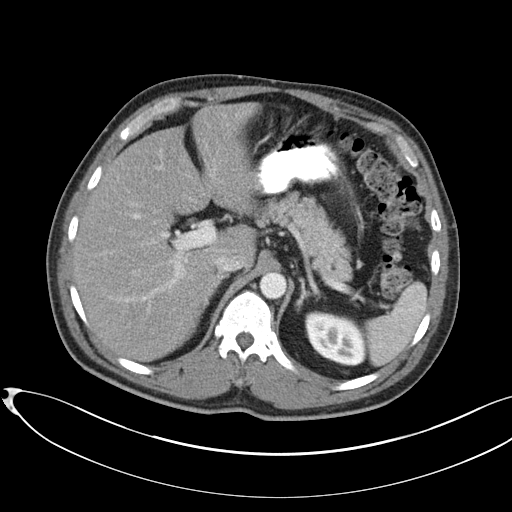
[im 70/89  lung]
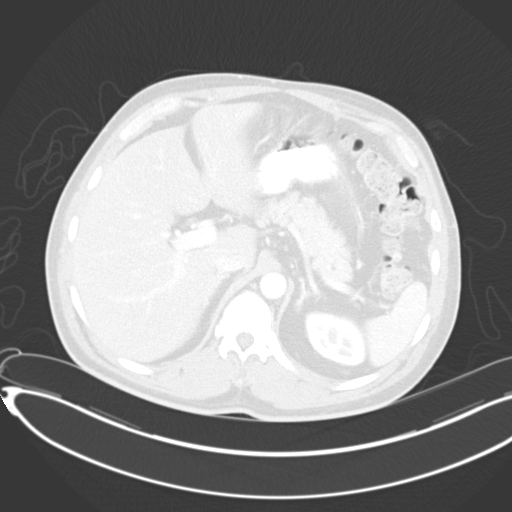
[im 75/89  soft-tissue]
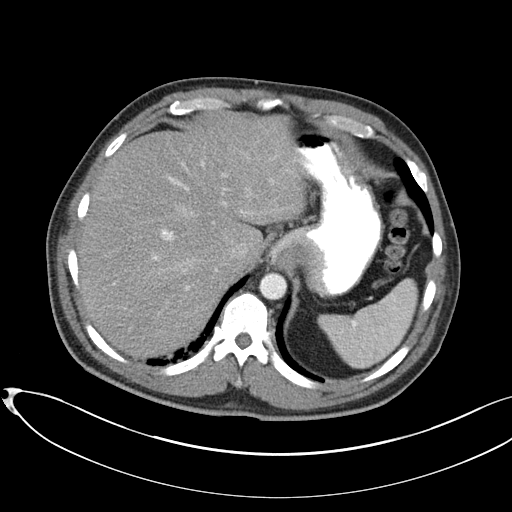
[im 75/89  lung]
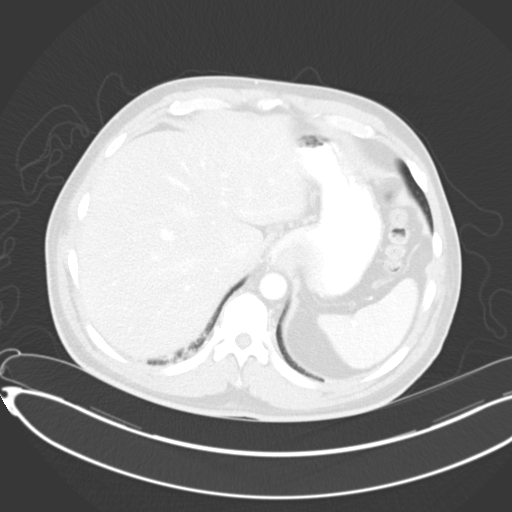
[im 79/89  lung]
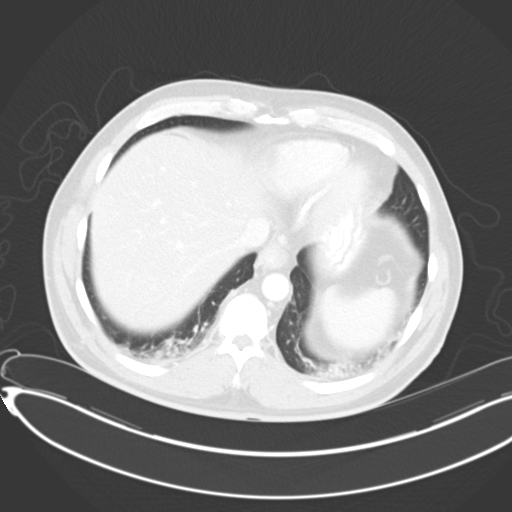
[im 84/89  soft-tissue]
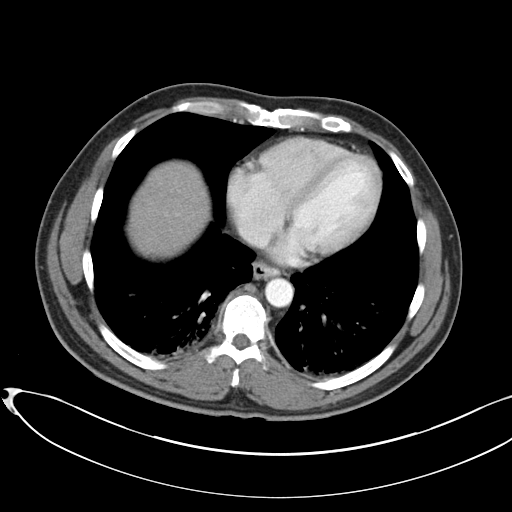
[im 84/89  lung]
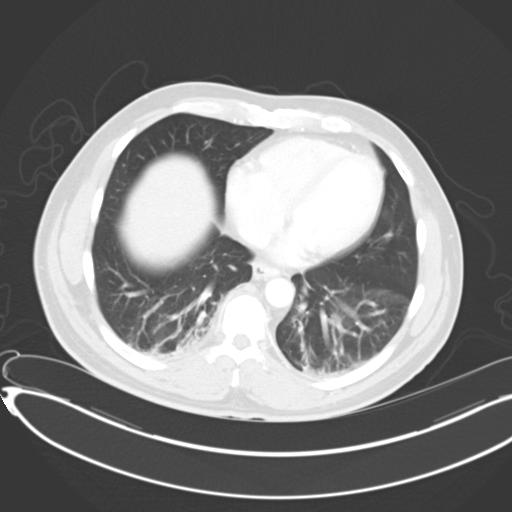

[14 of 32 positions shown; findings below may reference images not displayed]

FINDINGS: The lung bases demonstrate bibasilar scarring changes and
dependent atelectasis.  The distal esophagus demonstrates a small
hiatal hernia.

The liver demonstrates diffuse fatty infiltration but no focal
hepatic lesions or intrahepatic biliary dilatation.  The
gallbladder is surgically absent.  No common bile duct dilatation.
The pancreas is unremarkable.  The spleen is normal in size.  No
focal lesions.  The adrenal glands and kidneys are normal.

The stomach, duodenum, small bowel and colon grossly normal.  No
inflammatory changes, mass lesions or obstruction. The appendix is
normal.  The fundal region of the stomach is not well distended.
No mesenteric or retroperitoneal mass or adenopathy.  Small
scattered lymph nodes are noted.  The aorta is normal in caliber.
The major branch vessels are patent.

The bladder is markedly distended extending up above the umbilicus.
Mild prostate gland enlargement.  The seminal vesicles are normal.
No pelvic mass, adenopathy or free pelvic fluid collections.  No
inguinal mass or hernia.

The bony structures are unremarkable.
IMPRESSION: 1.  Markedly distended bladder extending up above the umbilicus.
2.  Mild prostate gland enlargement.
3.  No acute abdominal findings.

## 2013-12-29 ENCOUNTER — Emergency Department (HOSPITAL_COMMUNITY)
Admission: EM | Admit: 2013-12-29 | Discharge: 2013-12-29 | Disposition: A | Discharge status: Home / Self Care | Payer: Self-pay | Attending: Emergency Medicine | Admitting: Emergency Medicine

## 2013-12-29 ENCOUNTER — Emergency Department (HOSPITAL_COMMUNITY): Payer: Self-pay

## 2013-12-29 ENCOUNTER — Encounter (HOSPITAL_COMMUNITY): Payer: Self-pay

## 2013-12-29 DIAGNOSIS — Z8701 Personal history of pneumonia (recurrent): Secondary | ICD-10-CM | POA: Insufficient documentation

## 2013-12-29 DIAGNOSIS — Z8679 Personal history of other diseases of the circulatory system: Secondary | ICD-10-CM | POA: Insufficient documentation

## 2013-12-29 DIAGNOSIS — R531 Weakness: Secondary | ICD-10-CM | POA: Insufficient documentation

## 2013-12-29 DIAGNOSIS — R4182 Altered mental status, unspecified: Secondary | ICD-10-CM

## 2013-12-29 DIAGNOSIS — R109 Unspecified abdominal pain: Secondary | ICD-10-CM | POA: Insufficient documentation

## 2013-12-29 DIAGNOSIS — Z8669 Personal history of other diseases of the nervous system and sense organs: Secondary | ICD-10-CM | POA: Insufficient documentation

## 2013-12-29 DIAGNOSIS — J45909 Unspecified asthma, uncomplicated: Secondary | ICD-10-CM | POA: Insufficient documentation

## 2013-12-29 DIAGNOSIS — Z720 Tobacco use: Secondary | ICD-10-CM | POA: Insufficient documentation

## 2013-12-29 DIAGNOSIS — Z8719 Personal history of other diseases of the digestive system: Secondary | ICD-10-CM | POA: Insufficient documentation

## 2013-12-29 DIAGNOSIS — F10920 Alcohol use, unspecified with intoxication, uncomplicated: Secondary | ICD-10-CM

## 2013-12-29 DIAGNOSIS — F10120 Alcohol abuse with intoxication, uncomplicated: Secondary | ICD-10-CM | POA: Insufficient documentation

## 2013-12-29 LAB — CBC WITH DIFFERENTIAL/PLATELET
BASOS PCT: 0 % (ref 0–1)
Basophils Absolute: 0 10*3/uL (ref 0.0–0.1)
EOS ABS: 0 10*3/uL (ref 0.0–0.7)
EOS PCT: 0 % (ref 0–5)
HEMATOCRIT: 41.7 % (ref 39.0–52.0)
Hemoglobin: 14.5 g/dL (ref 13.0–17.0)
LYMPHS ABS: 2.1 10*3/uL (ref 0.7–4.0)
Lymphocytes Relative: 38 % (ref 12–46)
MCH: 31 pg (ref 26.0–34.0)
MCHC: 34.8 g/dL (ref 30.0–36.0)
MCV: 89.1 fL (ref 78.0–100.0)
MONO ABS: 0.3 10*3/uL (ref 0.1–1.0)
MONOS PCT: 6 % (ref 3–12)
Neutro Abs: 3.1 10*3/uL (ref 1.7–7.7)
Neutrophils Relative %: 56 % (ref 43–77)
Platelets: 169 10*3/uL (ref 150–400)
RBC: 4.68 MIL/uL (ref 4.22–5.81)
RDW: 13.2 % (ref 11.5–15.5)
WBC: 5.5 10*3/uL (ref 4.0–10.5)

## 2013-12-29 LAB — ETHANOL: ALCOHOL ETHYL (B): 291 mg/dL — AB (ref 0–11)

## 2013-12-29 LAB — URINALYSIS, ROUTINE W REFLEX MICROSCOPIC
Bilirubin Urine: NEGATIVE
Glucose, UA: NEGATIVE mg/dL
HGB URINE DIPSTICK: NEGATIVE
Ketones, ur: NEGATIVE mg/dL
Leukocytes, UA: NEGATIVE
NITRITE: NEGATIVE
PROTEIN: NEGATIVE mg/dL
Specific Gravity, Urine: 1.004 — ABNORMAL LOW (ref 1.005–1.030)
UROBILINOGEN UA: 0.2 mg/dL (ref 0.0–1.0)
pH: 5 (ref 5.0–8.0)

## 2013-12-29 LAB — COMPREHENSIVE METABOLIC PANEL
ALK PHOS: 122 U/L — AB (ref 39–117)
ALT: 32 U/L (ref 0–53)
ANION GAP: 18 — AB (ref 5–15)
AST: 22 U/L (ref 0–37)
Albumin: 4 g/dL (ref 3.5–5.2)
BUN: 10 mg/dL (ref 6–23)
CO2: 18 mEq/L — ABNORMAL LOW (ref 19–32)
Calcium: 8.2 mg/dL — ABNORMAL LOW (ref 8.4–10.5)
Chloride: 104 mEq/L (ref 96–112)
Creatinine, Ser: 0.81 mg/dL (ref 0.50–1.35)
GFR calc non Af Amer: >90 mL/min (ref >90)
GLUCOSE: 121 mg/dL — AB (ref 70–99)
POTASSIUM: 4 meq/L (ref 3.7–5.3)
Sodium: 140 mEq/L (ref 137–147)
TOTAL PROTEIN: 6.8 g/dL (ref 6.0–8.3)
Total Bilirubin: <0.2 mg/dL — ABNORMAL LOW (ref 0.3–1.2)

## 2013-12-29 LAB — I-STAT CG4 LACTIC ACID, ED: LACTIC ACID, VENOUS: 1.84 mmol/L (ref 0.5–2.2)

## 2013-12-29 LAB — RAPID URINE DRUG SCREEN, HOSP PERFORMED
Amphetamines: NOT DETECTED
BARBITURATES: NOT DETECTED
BENZODIAZEPINES: NOT DETECTED
COCAINE: NOT DETECTED
Opiates: NOT DETECTED
TETRAHYDROCANNABINOL: NOT DETECTED

## 2013-12-29 LAB — CK: Total CK: 200 U/L (ref 7–232)

## 2013-12-29 LAB — BASIC METABOLIC PANEL
ANION GAP: 12 (ref 5–15)
BUN: 7 mg/dL (ref 6–23)
CHLORIDE: 112 meq/L (ref 96–112)
CO2: 19 meq/L (ref 19–32)
CREATININE: 0.7 mg/dL (ref 0.50–1.35)
Calcium: 6.8 mg/dL — ABNORMAL LOW (ref 8.4–10.5)
GFR calc Af Amer: >90 mL/min (ref >90)
GFR calc non Af Amer: >90 mL/min (ref >90)
GLUCOSE: 102 mg/dL — AB (ref 70–99)
Potassium: 4 mEq/L (ref 3.7–5.3)
Sodium: 143 mEq/L (ref 137–147)

## 2013-12-29 LAB — TROPONIN I: Troponin I: <0.3 ng/mL (ref <0.30)

## 2013-12-29 LAB — LIPASE, BLOOD: Lipase: 62 U/L — ABNORMAL HIGH (ref 11–59)

## 2013-12-29 MED ORDER — SODIUM CHLORIDE 0.9 % IV BOLUS (SEPSIS)
1000.0000 mL | Freq: Once | INTRAVENOUS | Status: AC
  Administered 2013-12-29: 1000 mL via INTRAVENOUS

## 2013-12-29 MED ORDER — ONDANSETRON HCL 4 MG/2ML IJ SOLN
4.0000 mg | Freq: Once | INTRAMUSCULAR | Status: AC
  Administered 2013-12-29: 4 mg via INTRAVENOUS
  Filled 2013-12-29: qty 2

## 2013-12-29 MED ORDER — IOHEXOL 300 MG/ML  SOLN
25.0000 mL | Freq: Once | INTRAMUSCULAR | Status: AC | PRN
  Administered 2013-12-29: 25 mL via ORAL

## 2013-12-29 MED ORDER — IOHEXOL 300 MG/ML  SOLN
100.0000 mL | Freq: Once | INTRAMUSCULAR | Status: AC | PRN
  Administered 2013-12-29: 100 mL via INTRAVENOUS

## 2013-12-29 MED ORDER — ONDANSETRON HCL 4 MG/2ML IJ SOLN: 4.0000 mg | Freq: Once | INTRAMUSCULAR | Status: DC
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
## 2013-12-29 NOTE — ED Notes (Signed)
Pt. Asleep and snoring. O2 sat 87% on RA. Placed on 2L, O2 sat 95%.

## 2013-12-29 NOTE — ED Notes (Signed)
Per EMS, patient c/o left upper quadrant abdominal pain with left sided flank pain. Reports N/V. ETOH - reports drank a 12 pack of beer this AM.

## 2013-12-29 NOTE — ED Notes (Signed)
CT called and informed that he is finished with his contrast.

## 2013-12-29 NOTE — Discharge Instructions (Signed)
Alcohol Intoxication °Alcohol intoxication occurs when the amount of alcohol that a person has consumed impairs his or her ability to mentally and physically function. Alcohol directly impairs the normal chemical activity of the brain. Drinking large amounts of alcohol can lead to changes in mental function and behavior, and it can cause many physical effects that can be harmful.  °Alcohol intoxication can range in severity from mild to very severe. Various factors can affect the level of intoxication that occurs, such as the person's age, gender, weight, frequency of alcohol consumption, and the presence of other medical conditions (such as diabetes, seizures, or heart conditions). Dangerous levels of alcohol intoxication may occur when people drink large amounts of alcohol in a short period (binge drinking). Alcohol can also be especially dangerous when combined with certain prescription medicines or "recreational" drugs. °SIGNS AND SYMPTOMS °Some common signs and symptoms of mild alcohol intoxication include: °· Loss of coordination. °· Changes in mood and behavior. °· Impaired judgment. °· Slurred speech. °As alcohol intoxication progresses to more severe levels, other signs and symptoms will appear. These may include: °· Vomiting. °· Confusion and impaired memory. °· Slowed breathing. °· Seizures. °· Loss of consciousness. °DIAGNOSIS  °Your health care provider will take a medical history and perform a physical exam. You will be asked about the amount and type of alcohol you have consumed. Blood tests will be done to measure the concentration of alcohol in your blood. In many places, your blood alcohol level must be lower than 80 mg/dL (0.08%) to legally drive. However, many dangerous effects of alcohol can occur at much lower levels.  °TREATMENT  °People with alcohol intoxication often do not require treatment. Most of the effects of alcohol intoxication are temporary, and they go away as the alcohol naturally  leaves the body. Your health care provider will monitor your condition until you are stable enough to go home. Fluids are sometimes given through an IV access tube to help prevent dehydration.  °HOME CARE INSTRUCTIONS °· Do not drive after drinking alcohol. °· Stay hydrated. Drink enough water and fluids to keep your urine clear or pale yellow. Avoid caffeine.   °· Only take over-the-counter or prescription medicines as directed by your health care provider.   °SEEK MEDICAL CARE IF:  °· You have persistent vomiting.   °· You do not feel better after a few days. °· You have frequent alcohol intoxication. Your health care provider can help determine if you should see a substance use treatment counselor. °SEEK IMMEDIATE MEDICAL CARE IF:  °· You become shaky or tremble when you try to stop drinking.   °· You shake uncontrollably (seizure).   °· You throw up (vomit) blood. This may be bright red or may look like black coffee grounds.   °· You have blood in your stool. This may be bright red or may appear as a black, tarry, bad smelling stool.   °· You become lightheaded or faint.   °MAKE SURE YOU:  °· Understand these instructions. °· Will watch your condition. °· Will get help right away if you are not doing well or get worse. °Document Released: 10/31/2004 Document Revised: 09/23/2012 Document Reviewed: 06/26/2012 °ExitCare® Patient Information ©2015 [employer]. This information is not intended to replace advice given to you by your health care provider. Make sure you discuss any questions you have with your health care provider. ° ° °Emergency Department Resource Guide °1) Find a Doctor and Pay Out of Pocket °Although you won't have to find out who is covered by your   insurance plan, it is a good idea to ask around and get recommendations. You will then need to call the office and see if the doctor you have chosen will accept you as a new patient and what types of options they offer for patients who are self-pay.  Some doctors offer discounts or will set up payment plans for their patients who do not have insurance, but you will need to ask so you aren't surprised when you get to your appointment. ° °2) Contact Your Local Health Department °Not all health departments have doctors that can see patients for sick visits, but many do, so it is worth a call to see if yours does. If you don't know where your local health department is, you can check in your phone book. The CDC also has a tool to help you locate your state's health department, and many state websites also have listings of all of their local health departments. ° °3) Find a Walk-in Clinic °If your illness is not likely to be very severe or complicated, you may want to try a walk in clinic. These are popping up all over the country in pharmacies, drugstores, and shopping centers. They're usually staffed by nurse practitioners or physician assistants that have been trained to treat common illnesses and complaints. They're usually fairly quick and inexpensive. However, if you have serious medical issues or chronic medical problems, these are probably not your best option. ° °No Primary Care Doctor: °- Call [facility] at  832-8000 - they can help you locate a primary care doctor that  accepts your insurance, provides certain services, etc. °- Physician Referral Service- 1-800-533-3463 ° °Chronic Pain Problems: °Organization         Address  Phone   Notes  °[facility]  (336) 297-2271 Patients need to be referred by their primary care doctor.  ° °Medication Assistance: °Organization         Address  Phone   Notes  °[place] [facility] [address] °[place], [place] [address] (336) 641-8030 --Must be a resident of [place] °-- Must have NO insurance coverage whatsoever (no Medicaid/ Medicare, etc.) °-- The pt. MUST have a primary care doctor that directs their care regularly and follows them in the  community °  °[facility]  (866) 331-1348   °[employer]  (888) 892-1162   ° °Agencies that provide inexpensive medical care: °Organization         Address  Phone   Notes  °[facility]  (336) 832-8035   °[facility]    (336) 832-7272   °[facility] [address] °[place], [place] [address] (336) 832-4777   °[facility] [address], °[place] (336) 271-4999   °[facility]    (336) 373-0678   °[facility]    (336) 272-1050   °[facility] ° [address], [place] Phone:  (336) 832-4444, Fax:  (336) 832-4440 Hours of Operation:  9 am - 6 pm, M-F.  Also accepts Medicaid/Medicare and self-pay.  °[facility] ° [address], [place] Phone: (336) [phone], Fax: (336) [phone]. Hours of Operation:  8:30 am - 5:30 pm, M-F.  Also accepts Medicaid and self-pay.  °[facility] [place] [address], [place] Phone: (336) 878-6027   °[facility] [address], [place], [place] (336)723-1848, Ext. 123 Mondays &   Thursdays: 7-9 AM.  First 15 patients are seen on a first come, first serve basis. °  ° °Medicaid-accepting [place] Providers: ° °Organization         Address  Phone   Notes  °[facility] [address], [place] (336) 641-2100 Also accepts self-pay patients.  °[facility] [address], [place] ° (336) [phone]   °[facility] [address], [place] (336) 288-8857   °[facility] [address] [place] [address], [place] (336) 299-7000   °Veita Bland [address], [place]  ° (336) 373-1557 Only accepts [place] Access Medicaid patients after they have their name applied to their card.  ° °Self-Pay (no insurance) in [place]: ° °Organization         Address  Phone   Notes  °[facility] [address], [place] (336) 832-1970   °[facility] [address], [place] (336) 832-4400   °[facility] ° [address], [place] (336) 992-4800   °[facility]/Dr. Osei-Bonsu ° [address] [place] [address], [place] or [address], [place] (336) [phone] Phone number for both [place] and [place] locations is the same.  °[facility] [address], [place] (336) 299-0000   °[facility] [address] [place] [address], [place] or [address] (336) 852-7530 °(336) 878-2260   °[facility] [address], [place] (336) 350-1642, phone; (336) 294-5005, fax Sees patients 1st and 3rd Saturday of every month.  Must not qualify for public or private insurance (i.e. Medicaid, Medicare, [place] Health Choice, Veterans' Benefits) • Household income should be no more than 200% of the poverty level •The clinic cannot treat you if you are pregnant or think you are pregnant • Sexually transmitted diseases are not treated at the clinic.  ° ° °Dental Care: °Organization         Address  Phone  Notes  °[facility] [address], [place] (336) 641-6152 Accepts children up to age 21 who are enrolled in Medicaid or [place] Health Choice; pregnant women with a Medicaid card; and children who have applied for Medicaid or [place] Health Choice, but were declined, whose parents can pay a reduced fee at time of service.  °[facility] [place]  [address], [place] (336) 641-7733 Accepts children up to age 21 who are enrolled in Medicaid or [place] Health Choice; pregnant women with a Medicaid card; and children who have applied for Medicaid or [place] Health Choice, but were declined, whose parents can pay a reduced fee at time of service.  °[facility] ° [address],  (336) 641-4533 Patients are seen by appointment only. Walk-ins are not accepted. [facility] will see patients 18 years of age and older. °Monday - Tuesday (8am-5pm) °Most Wednesdays (8:30-5pm) °$30 per visit, cash only  °[facility] ° [address], [place] (336) 641-4533 Patients are seen by appointment only. Walk-ins are not accepted. [facility] will see patients 18 years of age and older. °One Wednesday Evening (Monthly: Volunteer Based).  $30 per visit, cash only  °[facility]  (919) [phone] for adults; Children under   age 4, call Graduate Pediatric Dentistry at ([phone]) [phone]. Children aged 4-14, please call ([phone]) [phone] to request a pediatric application. ° Dental services are provided in all areas of dental care including fillings, crowns and bridges, complete and partial dentures, implants, gum treatment, root canals, and extractions. Preventive care is also provided. Treatment is provided to both adults and children. °Patients are selected via a lottery and there is often a waiting list. °  °[facility] [address], °[place] ° (336) 763-8833 [email or web address] °  °[facility] [address], [place], [place] (336)723-1848, Ext. 123 Second and Fourth Thursday of each month, opens at 6:30 AM; Clinic ends at 9 AM.  Patients are seen on a first-come first-served basis, and a limited number are seen during each clinic.  ° °[facility] ° [address], [place], [place] (336) 723-7904   Eligibility Requirements °You must have lived in [place], [place], or [place] counties for at least the last three months. °  You cannot be eligible for state or federal sponsored healthcare insurance, including Veterans Administration, Medicaid, or Medicare. °  You generally cannot be eligible for healthcare insurance through your employer.  °  How to apply: °Eligibility screenings are held every  Tuesday and Wednesday afternoon from 1:00 pm until 4:00 pm. You do not need an appointment for the interview!  °[facility] [address], [place], [place] 336-631-2330   °[facility]  336-342-8273   °[facility]  336-703-3100   °[facility]  336-570-6415   ° °Behavioral Health Resources in the Community: °Intensive Outpatient Programs °Organization         Address  Phone  Notes  °[facility] [address], [place], [place] 336-878-6098   °[facility] [address], [place], [place] 336-832-9800   °[facility] [address], [place], [place] ° 336-882-2125   °[facility] [address],  °[place], [place] 1-800-853-5163 or 336-641-4981   °Substance Abuse Resources °Organization         Address  Phone  Notes  °[facility]  336-882-2125   °[facility]  336-784-9470   °[facility]  336-285-9073   °[facility]  336-845-3988   °Residential & Outpatient Substance Abuse Program  1-800-659-3381   °Psychological Services °Organization         Address  Phone  Notes  °[facility]  336- 832-9600   °[facility]  336- 378-7881   °[facility] [address], [place] 1-800-853-5163 or 336-641-4981   ° °Mobile Crisis Teams °Organization         Address  Phone  Notes  °[facility], Mobile Crisis Care Unit  1-877-626-1772   °Assertive °[facility] ° [address], [place] 336-834-9664   °Sharon DeEsch [address] °[place] 336-554-5454   ° °Self-Help/Support Groups °Organization         Address  Phone             Notes  °[facility] - variety of support groups  336- 373-1402 Call for more information  °Narcotics Anonymous (NA), [facility] [address], °[place]  2 meetings at this location   ° °Residential Treatment Programs °Organization         Address  Phone  Notes  °[facility] [address],    °[place]  1-866-801-8205   °[facility] ° [address], [place], [place] 704-293-8524   °[facility] [address], [place] 336-845-3988 Admissions: 8am-3pm M-F  °[facility] [address]    °[place], [place] 336-841-1104   °[facility] [address], [place], [place] 336-379-7146   °[facility] [address]  °[place], [place] 336-285-9073   °[facility] - Intensive Outpatient [address], [place], [place] 336-852-3033   °[facility] [address]  °[place], [place] 1-877-615-2722 or 336-784-9470   °[facility] [address] [place], [place] 336-227-7417 Accepts Medicaid  °[facility] [address]  °[place] 1-800-659-3381 Substance Abuse/Addiction Treatment  ° °[place] Behavioral Health Resources °Organization         Address  Phone  Notes  °[facility]  (888) 581-9988   °Julie Brannon, PhD [address] [place], [place]   (336) 349-5553 or (336) 951-0000   °[facility]   [address] °[place], [place] (336) 349-4454   °[facility] [address], [place], [place] (336) 342-8316 Insurance/Medicaid/sponsorship through [facility]  °[facility] [address]                                    [place], [place] (336) 342-8316 Therapy/tele-psych/case  °[facility] [address]  ° [place], [place] (336) 349-2233    °Dr. Arfeen  (336) 349-4544   °[facility] [place]  [employer] [place] [facility] 1) [address], [place] °2) [address], [place] °3)  [address], [place] (336) 349-3220 °(336) 342-7768 ° °(336) 342-8140   °[place] [facility] (336) 342-1394 or (336) 342-3537 (After  Hours)    ° ° ° °

## 2013-12-31 ENCOUNTER — Emergency Department (HOSPITAL_COMMUNITY): Payer: Self-pay

## 2013-12-31 ENCOUNTER — Encounter (HOSPITAL_COMMUNITY): Payer: Self-pay | Admitting: Emergency Medicine

## 2013-12-31 ENCOUNTER — Inpatient Hospital Stay (HOSPITAL_COMMUNITY)
Admission: EM | Admit: 2013-12-31 | Discharge: 2014-01-04 | DRG: 391 | Disposition: A | Discharge status: Home / Self Care | Payer: Self-pay | Attending: Internal Medicine | Admitting: Internal Medicine

## 2013-12-31 DIAGNOSIS — Z9049 Acquired absence of other specified parts of digestive tract: Secondary | ICD-10-CM | POA: Diagnosis present

## 2013-12-31 DIAGNOSIS — K449 Diaphragmatic hernia without obstruction or gangrene: Secondary | ICD-10-CM | POA: Diagnosis present

## 2013-12-31 DIAGNOSIS — D696 Thrombocytopenia, unspecified: Secondary | ICD-10-CM | POA: Diagnosis present

## 2013-12-31 DIAGNOSIS — I959 Hypotension, unspecified: Secondary | ICD-10-CM | POA: Diagnosis present

## 2013-12-31 DIAGNOSIS — G4730 Sleep apnea, unspecified: Secondary | ICD-10-CM | POA: Diagnosis present

## 2013-12-31 DIAGNOSIS — J45909 Unspecified asthma, uncomplicated: Secondary | ICD-10-CM | POA: Diagnosis present

## 2013-12-31 DIAGNOSIS — K760 Fatty (change of) liver, not elsewhere classified: Secondary | ICD-10-CM | POA: Diagnosis present

## 2013-12-31 DIAGNOSIS — K921 Melena: Secondary | ICD-10-CM

## 2013-12-31 DIAGNOSIS — D62 Acute posthemorrhagic anemia: Secondary | ICD-10-CM | POA: Diagnosis present

## 2013-12-31 DIAGNOSIS — R739 Hyperglycemia, unspecified: Secondary | ICD-10-CM | POA: Diagnosis present

## 2013-12-31 DIAGNOSIS — F1010 Alcohol abuse, uncomplicated: Secondary | ICD-10-CM | POA: Diagnosis present

## 2013-12-31 DIAGNOSIS — K2980 Duodenitis without bleeding: Principal | ICD-10-CM | POA: Diagnosis present

## 2013-12-31 DIAGNOSIS — R079 Chest pain, unspecified: Secondary | ICD-10-CM

## 2013-12-31 DIAGNOSIS — F419 Anxiety disorder, unspecified: Secondary | ICD-10-CM | POA: Diagnosis present

## 2013-12-31 DIAGNOSIS — R1013 Epigastric pain: Secondary | ICD-10-CM | POA: Insufficient documentation

## 2013-12-31 DIAGNOSIS — K922 Gastrointestinal hemorrhage, unspecified: Secondary | ICD-10-CM | POA: Diagnosis present

## 2013-12-31 DIAGNOSIS — K2971 Gastritis, unspecified, with bleeding: Secondary | ICD-10-CM | POA: Diagnosis present

## 2013-12-31 DIAGNOSIS — Z9119 Patient's noncompliance with other medical treatment and regimen: Secondary | ICD-10-CM | POA: Diagnosis present

## 2013-12-31 DIAGNOSIS — W19XXXA Unspecified fall, initial encounter: Secondary | ICD-10-CM

## 2013-12-31 DIAGNOSIS — K210 Gastro-esophageal reflux disease with esophagitis: Secondary | ICD-10-CM | POA: Diagnosis present

## 2013-12-31 DIAGNOSIS — F17210 Nicotine dependence, cigarettes, uncomplicated: Secondary | ICD-10-CM | POA: Diagnosis present

## 2013-12-31 DIAGNOSIS — G8929 Other chronic pain: Secondary | ICD-10-CM | POA: Diagnosis present

## 2013-12-31 LAB — URINALYSIS, ROUTINE W REFLEX MICROSCOPIC
Bilirubin Urine: NEGATIVE
GLUCOSE, UA: 500 mg/dL — AB
Hgb urine dipstick: NEGATIVE
Ketones, ur: 15 mg/dL — AB
LEUKOCYTES UA: NEGATIVE
Nitrite: NEGATIVE
PROTEIN: NEGATIVE mg/dL
Specific Gravity, Urine: 1.025 (ref 1.005–1.030)
Urobilinogen, UA: 0.2 mg/dL (ref 0.0–1.0)
pH: 5 (ref 5.0–8.0)

## 2013-12-31 LAB — BASIC METABOLIC PANEL
Anion gap: 10 (ref 5–15)
BUN: 29 mg/dL — AB (ref 6–23)
CO2: 23 meq/L (ref 19–32)
CREATININE: 0.68 mg/dL (ref 0.50–1.35)
Calcium: 7.2 mg/dL — ABNORMAL LOW (ref 8.4–10.5)
Chloride: 112 mEq/L (ref 96–112)
GFR calc Af Amer: >90 mL/min (ref >90)
GFR calc non Af Amer: >90 mL/min (ref >90)
GLUCOSE: 122 mg/dL — AB (ref 70–99)
Potassium: 4.5 mEq/L (ref 3.7–5.3)
Sodium: 145 mEq/L (ref 137–147)

## 2013-12-31 LAB — CBC
HCT: 25.5 % — ABNORMAL LOW (ref 39.0–52.0)
HEMATOCRIT: 26.6 % — AB (ref 39.0–52.0)
HEMOGLOBIN: 9.2 g/dL — AB (ref 13.0–17.0)
Hemoglobin: 8.9 g/dL — ABNORMAL LOW (ref 13.0–17.0)
MCH: 31.6 pg (ref 26.0–34.0)
MCH: 31.9 pg (ref 26.0–34.0)
MCHC: 34.6 g/dL (ref 30.0–36.0)
MCHC: 34.9 g/dL (ref 30.0–36.0)
MCV: 91.4 fL (ref 78.0–100.0)
MCV: 91.4 fL (ref 78.0–100.0)
PLATELETS: 125 10*3/uL — AB (ref 150–400)
Platelets: 135 10*3/uL — ABNORMAL LOW (ref 150–400)
RBC: 2.91 MIL/uL — AB (ref 4.22–5.81)
RBC: 2.79 MIL/uL — ABNORMAL LOW (ref 4.22–5.81)
RDW: 13.1 % (ref 11.5–15.5)
RDW: 13.1 % (ref 11.5–15.5)
WBC: 7.7 10*3/uL (ref 4.0–10.5)
WBC: 5.3 10*3/uL (ref 4.0–10.5)

## 2013-12-31 LAB — CBC WITH DIFFERENTIAL/PLATELET
BASOS ABS: 0 10*3/uL (ref 0.0–0.1)
Basophils Relative: 0 % (ref 0–1)
EOS PCT: 0 % (ref 0–5)
Eosinophils Absolute: 0 10*3/uL (ref 0.0–0.7)
HCT: 32.4 % — ABNORMAL LOW (ref 39.0–52.0)
HEMOGLOBIN: 11.3 g/dL — AB (ref 13.0–17.0)
LYMPHS PCT: 11 % — AB (ref 12–46)
Lymphs Abs: 1.3 10*3/uL (ref 0.7–4.0)
MCH: 31.7 pg (ref 26.0–34.0)
MCHC: 34.9 g/dL (ref 30.0–36.0)
MCV: 91 fL (ref 78.0–100.0)
MONO ABS: 0.4 10*3/uL (ref 0.1–1.0)
Monocytes Relative: 4 % (ref 3–12)
Neutro Abs: 9.5 10*3/uL — ABNORMAL HIGH (ref 1.7–7.7)
Neutrophils Relative %: 85 % — ABNORMAL HIGH (ref 43–77)
Platelets: 173 10*3/uL (ref 150–400)
RBC: 3.56 MIL/uL — ABNORMAL LOW (ref 4.22–5.81)
RDW: 13 % (ref 11.5–15.5)
WBC: 11.2 10*3/uL — AB (ref 4.0–10.5)

## 2013-12-31 LAB — COMPREHENSIVE METABOLIC PANEL
ALT: 22 U/L (ref 0–53)
AST: 14 U/L (ref 0–37)
Albumin: 3.2 g/dL — ABNORMAL LOW (ref 3.5–5.2)
Alkaline Phosphatase: 85 U/L (ref 39–117)
Anion gap: 15 (ref 5–15)
BUN: 39 mg/dL — ABNORMAL HIGH (ref 6–23)
CALCIUM: 8.4 mg/dL (ref 8.4–10.5)
CO2: 20 mEq/L (ref 19–32)
CREATININE: 0.79 mg/dL (ref 0.50–1.35)
Chloride: 105 mEq/L (ref 96–112)
GFR calc Af Amer: >90 mL/min (ref >90)
Glucose, Bld: 236 mg/dL — ABNORMAL HIGH (ref 70–99)
Potassium: 4.5 mEq/L (ref 3.7–5.3)
Sodium: 140 mEq/L (ref 137–147)
Total Bilirubin: 0.7 mg/dL (ref 0.3–1.2)
Total Protein: 6 g/dL (ref 6.0–8.3)

## 2013-12-31 LAB — TYPE AND SCREEN
ABO/RH(D): O POS
Antibody Screen: NEGATIVE

## 2013-12-31 LAB — LACTIC ACID, PLASMA: Lactic Acid, Venous: 2.5 mmol/L — ABNORMAL HIGH (ref 0.5–2.2)

## 2013-12-31 LAB — TROPONIN I: Troponin I: <0.3 ng/mL (ref <0.30)

## 2013-12-31 LAB — GLUCOSE, CAPILLARY
GLUCOSE-CAPILLARY: 93 mg/dL (ref 70–99)
GLUCOSE-CAPILLARY: 182 mg/dL — AB (ref 70–99)
Glucose-Capillary: 164 mg/dL — ABNORMAL HIGH (ref 70–99)

## 2013-12-31 LAB — POC OCCULT BLOOD, ED: Fecal Occult Bld: POSITIVE — AB

## 2013-12-31 LAB — PROTIME-INR
INR: 1.13 (ref 0.00–1.49)
PROTHROMBIN TIME: 14.6 s (ref 11.6–15.2)

## 2013-12-31 LAB — CBG MONITORING, ED
GLUCOSE-CAPILLARY: 118 mg/dL — AB (ref 70–99)
GLUCOSE-CAPILLARY: 101 mg/dL — AB (ref 70–99)

## 2013-12-31 LAB — LIPASE, BLOOD: Lipase: 27 U/L (ref 11–59)

## 2013-12-31 MED ORDER — SODIUM CHLORIDE 0.9 % IV SOLN
8.0000 mg/h | INTRAVENOUS | Status: AC
  Administered 2013-12-31 – 2014-01-02 (×6): 8 mg/h via INTRAVENOUS
  Filled 2013-12-31 (×13): qty 80

## 2013-12-31 MED ORDER — VITAMIN B-1 100 MG PO TABS
100.0000 mg | ORAL_TABLET | Freq: Every day | ORAL | Status: DC
  Administered 2014-01-01 – 2014-01-04 (×4): 100 mg via ORAL
  Filled 2013-12-31 (×5): qty 1

## 2013-12-31 MED ORDER — ONDANSETRON HCL 4 MG/2ML IJ SOLN
4.0000 mg | Freq: Four times a day (QID) | INTRAMUSCULAR | Status: DC | PRN
  Administered 2013-12-31 – 2014-01-02 (×3): 4 mg via INTRAVENOUS
  Filled 2013-12-31 (×3): qty 2

## 2013-12-31 MED ORDER — MORPHINE SULFATE 2 MG/ML IJ SOLN
1.0000 mg | INTRAMUSCULAR | Status: DC | PRN
  Administered 2014-01-01 – 2014-01-02 (×2): 2 mg via INTRAVENOUS
  Filled 2013-12-31 (×2): qty 1

## 2013-12-31 MED ORDER — HYDROMORPHONE HCL 1 MG/ML IJ SOLN
1.0000 mg | Freq: Once | INTRAMUSCULAR | Status: AC
  Administered 2013-12-31: 1 mg via INTRAVENOUS
  Filled 2013-12-31: qty 1

## 2013-12-31 MED ORDER — SODIUM CHLORIDE 0.9 % IV BOLUS (SEPSIS)
1000.0000 mL | INTRAVENOUS | Status: AC
  Administered 2013-12-31: 1000 mL via INTRAVENOUS

## 2013-12-31 MED ORDER — PANTOPRAZOLE SODIUM 40 MG IV SOLR
40.0000 mg | Freq: Two times a day (BID) | INTRAVENOUS | Status: DC
  Administered 2014-01-03: 40 mg via INTRAVENOUS
  Filled 2013-12-31 (×2): qty 40

## 2013-12-31 MED ORDER — ONDANSETRON HCL 4 MG/2ML IJ SOLN
4.0000 mg | Freq: Once | INTRAMUSCULAR | Status: AC
  Administered 2013-12-31: 4 mg via INTRAVENOUS
  Filled 2013-12-31: qty 2

## 2013-12-31 MED ORDER — PROMETHAZINE HCL 25 MG/ML IJ SOLN
12.5000 mg | Freq: Four times a day (QID) | INTRAMUSCULAR | Status: DC | PRN
Start: 2013-12-31 — End: 2014-01-04

## 2013-12-31 MED ORDER — OXYCODONE HCL 5 MG PO TABS
5.0000 mg | ORAL_TABLET | ORAL | Status: DC | PRN
  Administered 2013-12-31 – 2014-01-03 (×9): 10 mg via ORAL
  Filled 2013-12-31 (×9): qty 2

## 2013-12-31 MED ORDER — THIAMINE HCL 100 MG/ML IJ SOLN
100.0000 mg | Freq: Every day | INTRAMUSCULAR | Status: DC
  Filled 2013-12-31 (×2): qty 1

## 2013-12-31 MED ORDER — SODIUM CHLORIDE 0.9 % IV SOLN
INTRAVENOUS | Status: DC
  Administered 2013-12-31: 125 mL/h via INTRAVENOUS
  Administered 2013-12-31 – 2014-01-02 (×4): via INTRAVENOUS

## 2013-12-31 MED ORDER — SODIUM CHLORIDE 0.9 % IJ SOLN
3.0000 mL | Freq: Two times a day (BID) | INTRAMUSCULAR | Status: DC
  Administered 2013-12-31 – 2014-01-04 (×7): 3 mL via INTRAVENOUS

## 2013-12-31 MED ORDER — FOLIC ACID 1 MG PO TABS
1.0000 mg | ORAL_TABLET | Freq: Every day | ORAL | Status: DC
  Administered 2014-01-01 – 2014-01-04 (×4): 1 mg via ORAL
  Filled 2013-12-31 (×5): qty 1

## 2013-12-31 MED ORDER — ALBUTEROL SULFATE (2.5 MG/3ML) 0.083% IN NEBU: 2.5000 mg | INHALATION_SOLUTION | Freq: Four times a day (QID) | RESPIRATORY_TRACT | Status: DC | PRN

## 2013-12-31 MED ORDER — ADULT MULTIVITAMIN W/MINERALS CH
1.0000 | ORAL_TABLET | Freq: Every day | ORAL | Status: DC
  Administered 2014-01-01 – 2014-01-04 (×4): 1 via ORAL
  Filled 2013-12-31 (×5): qty 1

## 2013-12-31 MED ORDER — HYDROMORPHONE HCL 1 MG/ML IJ SOLN
1.0000 mg | INTRAMUSCULAR | Status: AC
  Administered 2013-12-31: 1 mg via INTRAVENOUS
  Filled 2013-12-31: qty 1

## 2013-12-31 MED ORDER — LORAZEPAM 1 MG PO TABS
1.0000 mg | ORAL_TABLET | Freq: Four times a day (QID) | ORAL | Status: DC | PRN
  Filled 2013-12-31: qty 1

## 2013-12-31 MED ORDER — LORAZEPAM 2 MG/ML IJ SOLN
1.0000 mg | Freq: Four times a day (QID) | INTRAMUSCULAR | Status: DC | PRN
  Administered 2013-12-31: 1 mg via INTRAVENOUS
  Filled 2013-12-31: qty 1

## 2013-12-31 MED ORDER — ALBUTEROL SULFATE HFA 108 (90 BASE) MCG/ACT IN AERS: 2.0000 | INHALATION_SPRAY | Freq: Four times a day (QID) | RESPIRATORY_TRACT | Status: DC | PRN

## 2013-12-31 MED ORDER — SODIUM CHLORIDE 0.9 % IV SOLN
80.0000 mg | Freq: Once | INTRAVENOUS | Status: AC
  Administered 2013-12-31: 80 mg via INTRAVENOUS
  Filled 2013-12-31: qty 80

## 2013-12-31 NOTE — Progress Notes (Signed)
   Consultation  Referring Provider:     [facility] Primary Care Physician:  No PCP Per Patient Primary Gastroenterologist:  none       Reason for Consultation: GI bleed             HPI:   Jacob Gaines is a 48 y.o. male who presented to ED today with abdominal pain, nausea/vomiting,hematemesis and melena. ETOH level 291 in ED. Lipase 62. Hgb 8.9, it was 16 in August. Initial BUN 39, down to 29 today.   Patient was napping yesterday when he was awoken by left sided abdominal pain. Upon sitting up he became nauseated then vomited dark red blood material which he believes was blood though he did have red colored fluids to drink yesterday. Following that he developed black stools. The abdominal pain involves whole left abdomen. Patient denies any previous history of abdominal pain or GI bleeding. He takes an occas NSAID. No chronic GI problems. Patient also complains of chest pain.   Past Medical History  Diagnosis Date  . ETOH abuse   . Tobacco abuse   . Enlarged heart     "when I was little" (09/18/2012)  . Chest pain     "only related to asthma attack" (09/18/2012)  . Shortness of breath     " related to asthma attack; sometimes in the mornings too" (09/18/2012)  . Pneumonia   . Anxiety   . Sleep apnea     "I don't wear a mask" (09/18/2012)  . Asthma   . GERD (gastroesophageal reflux disease)   . Headache(784.0)     "only related to asthma attack" (09/18/2012)    Past Surgical History  Procedure Laterality Date  . Laparoscopic cholecystectomy  2007    Performed in [place]  . Nasal sinus surgery Right 10/01/2013    Procedure: ENDOSCOPIC Transantral Repair of Right Orbital Floor Fracture ;  Surgeon: Dwight Bates, MD;  Location: [facility] OR;  Service: ENT;  Laterality: Right;    Family History  Problem Relation Age of Onset  . Heart attack Paternal Grandfather 58  . Colon cancer Paternal Grandmother      History  Substance Use Topics  . Smoking status: Current Some Day  Smoker -- 0.25 packs/day for .3 years    Types: Cigarettes  . Smokeless tobacco: Never Used  . Alcohol Use: 0.0 oz/week     Comment: 3-4 cans of beer on the weekends    Prior to Admission medications   Medication Sig Start Date End Date Taking? Authorizing Provider  albuterol (PROVENTIL HFA;VENTOLIN HFA) 108 (90 BASE) MCG/ACT inhaler Inhale 2 puffs into the lungs every 6 (six) hours as needed for wheezing or shortness of breath.   Yes Historical Provider, MD  oxyCODONE-acetaminophen (PERCOCET/ROXICET) 5-325 MG per tablet Take 2 tablets by mouth every 4 (four) hours as needed for moderate pain or severe pain. 09/23/13  Yes Victoria L Creech, PA-C    Current Facility-Administered Medications  Medication Dose Route Frequency Provider Last Rate Last Dose  . 0.9 %  sodium chloride infusion   Intravenous Continuous Jared M Gardner, DO 125 mL/hr at 12/31/13 0413    . albuterol (PROVENTIL) (2.5 MG/3ML) 0.083% nebulizer solution 2.5 mg  2.5 mg Nebulization Q6H PRN Jared M Gardner, DO      . folic acid (FOLVITE) tablet 1 mg  1 mg Oral Daily Jared M Gardner, DO   1 mg at 12/31/13 1011  . LORazepam (ATIVAN) tablet 1 mg  1 mg Oral   Q6H PRN Jared M Gardner, DO       Or  . LORazepam (ATIVAN) injection 1 mg  1 mg Intravenous Q6H PRN Jared M Gardner, DO   1 mg at 12/31/13 0619  . multivitamin with minerals tablet 1 tablet  1 tablet Oral Daily Jared M Gardner, DO   1 tablet at 12/31/13 1011  . pantoprazole (PROTONIX) 80 mg in sodium chloride 0.9 % 250 mL (0.32 mg/mL) infusion  8 mg/hr Intravenous Continuous Forrest Harrison, MD 25 mL/hr at 12/31/13 0116 8 mg/hr at 12/31/13 0116  . [START ON 01/03/2014] pantoprazole (PROTONIX) injection 40 mg  40 mg Intravenous Q12H Forrest Harrison, MD      . sodium chloride 0.9 % injection 3 mL  3 mL Intravenous Q12H Jared M Gardner, DO   3 mL at 12/31/13 0413  . thiamine (VITAMIN B-1) tablet 100 mg  100 mg Oral Daily Jared M Gardner, DO   100 mg at 12/31/13 1010   Or  .  thiamine (B-1) injection 100 mg  100 mg Intravenous Daily Jared M Gardner, DO        Allergies as of 12/30/2013  . (No Known Allergies)   Review of Systems:    All systems reviewed and negative except where noted in HPI.   Physical Exam:  Vital signs in last 24 hours: Temp:  [98 F (36.7 C)-98.1 F (36.7 C)] 98 F (36.7 C) (11/27 1510) Pulse Rate:  [74-137] 82 (11/27 1249) Resp:  [13-32] 23 (11/27 1249) BP: (92-116)/(57-80) 106/69 mmHg (11/27 1249) SpO2:  [94 %-100 %] 100 % (11/27 1249) Weight:  [162 lb 14.7 oz (73.9 kg)-170 lb (77.111 kg)] 162 lb 14.7 oz (73.9 kg) (11/27 1249)   General:   Pleasant Hispanic male Head:  Normocephalic and atraumatic. Eyes:   No icterus.   Conjunctiva pink. Ears:  Normal auditory acuity. Neck:  Supple; no masses felt Lungs:  Respirations even and unlabored. Lungs clear to auscultation bilaterally.   No wheezes, crackles, or rhonchi.  Heart:  Regular rate and rhythm;  murmur heard. Abdomen:  Soft, nondistended, nontender. Normal bowel sounds. No appreciable masses or hepatomegaly.  Rectal:  Heme positive in ED.  Msk:  Symmetrical without gross deformities.  Extremities:  Without edema. Neurologic:  Alert and  oriented x4;  grossly normal neurologically. Skin:  Intact without significant lesions or rashes. Cervical Nodes:  No significant cervical adenopathy. Psych:  Alert and cooperative. Normal affect.  LAB RESULTS:  Recent Labs  12/31/13 0100 12/31/13 0450 12/31/13 1329  WBC 11.2* 7.7 5.3  HGB 11.3* 9.2* 8.9*  HCT 32.4* 26.6* 25.5*  PLT 173 135* 125*   BMET  Recent Labs  12/29/13 1953 12/31/13 0100 12/31/13 0450  NA 143 140 145  K 4.0 4.5 4.5  CL 112 105 112  CO2 19 20 23  GLUCOSE 102* 236* 122*  BUN 7 39* 29*  CREATININE 0.70 0.79 0.68  CALCIUM 6.8* 8.4 7.2*   LFT  Recent Labs  12/31/13 0100  PROT 6.0  ALBUMIN 3.2*  AST 14  ALT 22  ALKPHOS 85  BILITOT 0.7   PT/INR  Recent Labs  12/31/13 0100  LABPROT  14.6  INR 1.13    STUDIES: Dg Chest 2 View  12/31/2013   CLINICAL DATA:  Chest pain and asthma  EXAM: CHEST  2 VIEW  COMPARISON:  12/29/2013  FINDINGS: The heart size and mediastinal contours are within normal limits. Both lungs are clear. The visualized skeletal structures are unremarkable.    IMPRESSION: No active cardiopulmonary disease.   Electronically Signed   By: Taylor  Stroud M.D.   On: 12/31/2013 01:49   Dg Chest 2 View  12/29/2013   CLINICAL DATA:  Left side abdominal pain.  Nausea and vomiting.  EXAM: CHEST  2 VIEW  COMPARISON:  CT chest 11/15/2012.  PA and lateral chest 04/01/2013.  FINDINGS: The lungs are clear. Heart size is upper normal. No pneumothorax or pleural effusion.  IMPRESSION: No acute disease.   Electronically Signed   By: Thomas  Dalessio M.D.   On: 12/29/2013 17:34   Ct Head Wo Contrast  12/31/2013   CLINICAL DATA:  Black bowel with fall and head trauma. Headache, facial pain, neck pain.  EXAM: CT HEAD WITHOUT CONTRAST  CT MAXILLOFACIAL WITHOUT CONTRAST  CT CERVICAL SPINE WITHOUT CONTRAST  TECHNIQUE: Multidetector CT imaging of the head, cervical spine, and maxillofacial structures were performed using the standard protocol without intravenous contrast. Multiplanar CT image reconstructions of the cervical spine and maxillofacial structures were also generated.  COMPARISON:  12/29/2013 head CT.  09/18/2013 face CT  FINDINGS: CT HEAD FINDINGS  Skull and Sinuses:No calvarial fracture. Facial findings are described below.  Orbits: No acute abnormality.  Brain: No evidence of acute abnormality, such as acute infarction, hemorrhage, hydrocephalus, or mass lesion/mass effect.  CT MAXILLOFACIAL FINDINGS  No acute facial fracture. The mandible is located. Healed appearance of medial and inferior right orbital blowout fractures, acute on imaging 09/18/2013. Right intraconal hemorrhage and edema has resolved. Currently there is no evidence of globe injury or postseptal hematoma.  Dysconjugate gaze, usually incidental. The right eye is deviated temporally, not expected if there was a medial rectus entrapment. Chronic deformity of the nasal arch and anterior nasal septum.  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 There is frothy secretions present within the left maxillary sinus, but a maxillary antrostomy or secondary ostium is widely patent. 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  CT CERVICAL SPINE FINDINGS  Negative for acute fracture or subluxation. No prevertebral edema. No gross cervical canal hematoma. Spondylotic changes in the mid and lower cervical spine. No significant osseous canal or foraminal stenosis.  IMPRESSION: 1. No evidence of intracranial, facial or cervical spine acute injury. 2. Stable appearance of remote right orbital blowout fractures. 3. Chronic sinusitis.   Electronically Signed   By: Jonathan  Watts M.D.   On: 12/31/2013 02:18   Ct Head Wo Contrast  12/29/2013   CLINICAL DATA:  Altered mental status.  EXAM: CT HEAD WITHOUT CONTRAST  TECHNIQUE: Contiguous axial images were obtained from the base of the skull through the vertex without intravenous contrast.  COMPARISON:  Head CT scan 09/23/2013 and 01/22/2013.  FINDINGS: The brain appears normal without hemorrhage, infarct, mass lesion, mass effect, midline shift or abnormal extra-axial fluid collection. There is marked mucosal thickening in both maxillary sinuses, worse on the left, with sclerosis in wall thickening seen bilaterally consistent with chronic change. Scattered ethmoid air cell disease is identified. The patient has a remote fracture of the medial wall of the right orbit.  IMPRESSION: No acute intracranial abnormality.  Chronic maxillary sinus disease, worse on the left.   Electronically Signed   By: Thomas  Dalessio M.D.   On: 12/29/2013 17:59   Ct Cervical Spine Wo Contrast  12/31/2013   CLINICAL DATA:  Black bowel with fall and  head trauma. Headache, facial pain, neck pain.  EXAM: CT HEAD WITHOUT CONTRAST  CT MAXILLOFACIAL WITHOUT CONTRAST    CT CERVICAL SPINE WITHOUT CONTRAST  TECHNIQUE: Multidetector CT imaging of the head, cervical spine, and maxillofacial structures were performed using the standard protocol without intravenous contrast. Multiplanar CT image reconstructions of the cervical spine and maxillofacial structures were also generated.  COMPARISON:  12/29/2013 head CT.  09/18/2013 face CT  FINDINGS: CT HEAD FINDINGS  Skull and Sinuses:No calvarial fracture. Facial findings are described below.  Orbits: No acute abnormality.  Brain: No evidence of acute abnormality, such as acute infarction, hemorrhage, hydrocephalus, or mass lesion/mass effect.  CT MAXILLOFACIAL FINDINGS  No acute facial fracture. The mandible is located. Healed appearance of medial and inferior right orbital blowout fractures, acute on imaging 09/18/2013. Right intraconal hemorrhage and edema has resolved. Currently there is no evidence of globe injury or postseptal hematoma. Dysconjugate gaze, usually incidental. The right eye is deviated temporally, not expected if there was a medial rectus entrapment. Chronic deformity of the nasal arch and anterior nasal septum.  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 There is frothy secretions present within the left maxillary sinus, but a maxillary antrostomy or secondary ostium is widely patent. 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  CT CERVICAL SPINE FINDINGS  Negative for acute fracture or subluxation. No prevertebral edema. No gross cervical canal hematoma. Spondylotic changes in the mid and lower cervical spine. No significant osseous canal or foraminal stenosis.  IMPRESSION: 1. No evidence of intracranial, facial or cervical spine acute injury. 2. Stable appearance of remote right orbital blowout fractures. 3. Chronic sinusitis.   Electronically  Signed   By: Jonathan  Watts M.D.   On: 12/31/2013 02:18   Ct Abdomen Pelvis W Contrast  12/29/2013   CLINICAL DATA:  Left upper quadrant pain. Left flank pain. Altered mental status.  EXAM: CT ABDOMEN AND PELVIS WITH CONTRAST  TECHNIQUE: Multidetector CT imaging of the abdomen and pelvis was performed using the standard protocol following bolus administration of intravenous contrast.  CONTRAST:  100mL OMNIPAQUE IOHEXOL 300 MG/ML  SOLN  COMPARISON:  07/31/2013  FINDINGS: Lower Chest:  Unremarkable.  Hepatobiliary: No masses identified. Mild steatosis noted. Surgical clips seen from prior cholecystectomy.  Pancreas: No mass, inflammatory changes, or other parenchymal abnormality identified.  Spleen:  Within normal limits in size and appearance.  Adrenal Glands:  No mass identified.  Kidneys/Urinary Tract: No masses identified. Mild right hydronephrosis and ureterectasis noted. No obstructing calculus visualized. Distended urinary bladder again noted, without evidence of focal wall thickening or mass.  Stomach/Bowel/Peritoneum: Descending colon diverticulosis again noted. No evidence of diverticulitis. Distal esophageal wall thickening also seen, suspicious for esophagitis.  Vascular/Lymphatic: No pathologically enlarged lymph nodes identified. No other significant abnormality identified.  Reproductive:  Mildly enlarged prostate.  Other:  None.  Musculoskeletal:  No suspicious bone lesions identified.  IMPRESSION: Mildly enlarged prostate and distended urinary bladder. Suggest clinical correlation for urinary retention.  Mild right hydronephrosis and ureterectasis. No obstructing calculus visualized. This may be due to vesicoureteral reflux.  Mild distal esophageal wall thickening, suspicious for esophagitis. Consider endoscopy for further evaluation.  Diverticulosis. No radiographic evidence of diverticulitis.  Mild hepatic steatosis.   Electronically Signed   By: John  Stahl M.D.   On: 12/29/2013 18:12   Ct  Maxillofacial Wo Cm  12/31/2013   CLINICAL DATA:  Black bowel with fall and head trauma. Headache, facial pain, neck pain.  EXAM: CT HEAD WITHOUT CONTRAST  CT MAXILLOFACIAL WITHOUT CONTRAST  CT CERVICAL SPINE WITHOUT CONTRAST  TECHNIQUE: Multidetector CT imaging of   the head, cervical spine, and maxillofacial structures were performed using the standard protocol without intravenous contrast. Multiplanar CT image reconstructions of the cervical spine and maxillofacial structures were also generated.  COMPARISON:  12/29/2013 head CT.  09/18/2013 face CT  FINDINGS: CT HEAD FINDINGS  Skull and Sinuses:No calvarial fracture. Facial findings are described below.  Orbits: No acute abnormality.  Brain: No evidence of acute abnormality, such as acute infarction, hemorrhage, hydrocephalus, or mass lesion/mass effect.  CT MAXILLOFACIAL FINDINGS  No acute facial fracture. The mandible is located. Healed appearance of medial and inferior right orbital blowout fractures, acute on imaging 09/18/2013. Right intraconal hemorrhage and edema has resolved. Currently there is no evidence of globe injury or postseptal hematoma. Dysconjugate gaze, usually incidental. The right eye is deviated temporally, not expected if there was a medial rectus entrapment. Chronic deformity of the nasal arch and anterior nasal septum.  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 There is frothy secretions present within the left maxillary sinus, but a maxillary antrostomy or secondary ostium is widely patent. 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  CT CERVICAL SPINE FINDINGS  Negative for acute fracture or subluxation. No prevertebral edema. No gross cervical canal hematoma. Spondylotic changes in the mid and lower cervical spine. No significant osseous canal or foraminal stenosis.  IMPRESSION: 1. No evidence of intracranial, facial or cervical spine acute injury. 2. Stable appearance of  remote right orbital blowout fractures. 3. Chronic sinusitis.   Electronically Signed   By: Jonathan  Watts M.D.   On: 12/31/2013 02:18   PREVIOUS ENDOSCOPIES:            none   Impression / Plan:   1. 48 year old Hispanic male GI bleed (hematemesis / melena). He had red fluids prior to vomiting but stools are heme positive and hgb is down significantly from baseline. Rule out PUD. Patient was tachycardic on admission, HR improved with IVF. Continue PPI drip, prn anti-emetics/analgesics.  Patient needs EGD for further evaluation. The benefits, risks, and potential complications of EGD with possible biopsies  were discussed with the patient and he agrees to proceed.   2. Acute, diffuse left sided abdominal pain. Again, rule out PUD. Lipase minimally elevated making acute pancreatitis but if EGD negative then may need CTscan. Of note he is s/p cholecystectomy.    3. Anemia of acute blood loss. Hgb in 8 range, down from baseline of around 16. Continue to follow, he may need blood if continues to drop.    4. ETOH abuse, intoxicated in ED today. No stigmata of chronic liver disease on exam. Coags okay, LFTs normal.   5. Thrombocytopenia, probably secondary to acute bleed and /or bone marrow suppression from ETOH  Thanks   LOS: 0 days   Paula Guenther  12/31/2013, 4:03 PM   GI Attending Note   Chart was reviewed and patient was examined. X-rays and lab were reviewed.    I agree with management and plans. CT does not demonstrate any acute abnormalities. Suspect active PUD vs gastritis, possible MW tear.  Plans as discussed above.  Brytni Dray D. Riker Collier, M.D., FACG [facility] Cell [phone]      

## 2013-12-31 NOTE — ED Notes (Signed)
Pt transported to radiology.

## 2013-12-31 NOTE — ED Notes (Signed)
Checked CBG 118 

## 2013-12-31 NOTE — ED Notes (Signed)
Attempted report 

## 2013-12-31 NOTE — Plan of Care (Signed)
Problem: Phase I Progression Outcomes Goal: Pain controlled with appropriate interventions Outcome: Progressing Goal: OOB as tolerated unless otherwise ordered Outcome: Completed/Met Date Met:  12/31/13 Goal: Voiding-avoid urinary catheter unless indicated Outcome: Completed/Met Date Met:  12/31/13 Goal: Hemodynamically stable Outcome: Progressing

## 2013-12-31 NOTE — Progress Notes (Signed)
[facility] TEAM 1 - Stepdown/ICU TEAM Progress Note  Bary RichardDonald K Silbernagel YQI:[id number] DOB: 29-Apr-1965 DOA: 12/31/2013 PCP: No PCP Per Patient  Admit HPI / Brief Narrative: 48 yo male h/o EtOH abuse who presented to the ED with c/o 1 day of abdominal pain with acute worsening the night of admit. Patient reported he woke up with sharp burning pain radiating from his left side.  When he stood up he felt dizzy. He then vomitted red material, though he did eat red items at dinner (cranberry sauce and red gatorade). He then had a melanotic stool. He called 911 and was brought to ED.  HPI/Subjective: Pt seen for a f/u visit.    Assessment/Plan:  UGIB - hematemesis and melena BUN elevated - Hgb dropping - hemoccult positive - pt is hemodynamically stable at present   Acute blood loss w/o anemia  Hgb has dropped from 11.3 at presentation to 9.2  Hyperglycemia  Check A1c  EtOH abuse   Tobacco abuse   Sleep apnea  Noncompliant w/ CPAP   Code Status: FULL Family Communication: no family present at time of exam Disposition Plan: SDU  Consultants: GI  Procedures: none  Antibiotics: none  DVT prophylaxis: SCDs  Objective: Blood pressure 101/59, pulse 78, temperature 98.1 F (36.7 C), temperature source Oral, resp. rate 16, weight 77.111 kg (170 lb), SpO2 100 %.  Intake/Output Summary (Last 24 hours) at 12/31/13 0913 Last data filed at 12/31/13 0449  Gross per 24 hour  Intake   1125 ml  Output      0 ml  Net   1125 ml   Exam: F/U exam completed   Data Reviewed: Basic Metabolic Panel:  Recent Labs Lab 12/29/13 1441 12/29/13 1953 12/31/13 0100 12/31/13 0450  NA 140 143 140 145  K 4.0 4.0 4.5 4.5  CL 104 112 105 112  CO2 18* 19 20 23   GLUCOSE 121* 102* 236* 122*  BUN 10 7 39* 29*  CREATININE 0.81 0.70 0.79 0.68  CALCIUM 8.2* 6.8* 8.4 7.2*    Liver Function Tests:  Recent Labs Lab 12/29/13 1441 12/31/13 0100  AST 22 14  ALT 32 22  ALKPHOS 122* 85    BILITOT <0.2* 0.7  PROT 6.8 6.0  ALBUMIN 4.0 3.2*    Recent Labs Lab 12/29/13 1441 12/31/13 0100  LIPASE 62* 27   Coags:  Recent Labs Lab 12/31/13 0100  INR 1.13   CBC:  Recent Labs Lab 12/29/13 1441 12/31/13 0100 12/31/13 0450  WBC 5.5 11.2* 7.7  NEUTROABS 3.1 9.5*  --   HGB 14.5 11.3* 9.2*  HCT 41.7 32.4* 26.6*  MCV 89.1 91.0 91.4  PLT 169 173 135*    Cardiac Enzymes:  Recent Labs Lab 12/29/13 1938 12/31/13 0100  CKTOTAL 200  --   TROPONINI <0.30 <0.30   CBG:  Recent Labs Lab 12/31/13 0546 12/31/13 0844  GLUCAP 118* 101*    No results found for this or any previous visit (from the past 240 hour(s)).   Studies:  Recent x-ray studies have been reviewed in detail by the Attending Physician  Scheduled Meds:  Scheduled Meds: . folic acid  1 mg Oral Daily  . multivitamin with minerals  1 tablet Oral Daily  . [START ON 01/03/2014] pantoprazole (PROTONIX) IV  40 mg Intravenous Q12H  . sodium chloride  3 mL Intravenous Q12H  . thiamine  100 mg Oral Daily   Or  . thiamine  100 mg Intravenous Daily    Time spent  on care of this patient: 25+ mins   Lonia BloodMCCLUNG,Donovan Gatchel T , MD   [facility] Office  [phone] Pager - Text Page per Loretha StaplerAmion as per below:  On-Call/Text Page:      [email or web address]      password [email or web address]  If 7PM-7AM, please contact night-coverage [email or web address] Password [email or web address] 12/31/2013, 9:13 AM   LOS: 0 days

## 2013-12-31 NOTE — ED Provider Notes (Signed)
CSN: [id number]     Arrival date & time 12/31/13  0000 History  This chart was scribed for Purvis Sheffield, MD by Annye Asa, ED Scribe. This patient was seen in room B18C/B18C and the patient's care was started at 12:15 AM.    Chief Complaint  Patient presents with  . GI Bleeding   Patient is a 48 y.o. male presenting with vomiting and hematochezia. The history is provided by the patient. No language interpreter was used.  Emesis Severity:  Mild Duration:  1 day Timing:  Intermittent Quality:  Stomach contents and bright red blood Progression:  Unchanged Chronicity:  New Relieved by:  Nothing Worsened by:  Nothing tried Ineffective treatments:  None tried Associated symptoms: abdominal pain and headaches   Associated symptoms: no chills, no diarrhea and no sore throat   Abdominal pain:    Location:  LUQ   Quality:  Unable to specify   Severity:  Moderate   Onset quality:  Gradual   Duration:  2 days   Timing:  Constant   Progression:  Worsening   Chronicity:  New Headaches:    Severity:  Unable to specify   Onset quality:  Unable to specify   Duration:  1 day   Timing:  Constant   Chronicity:  New Rectal Bleeding Quality:  Black and tarry Duration:  1 day Progression:  Unable to specify Chronicity:  New Similar prior episodes: no   Relieved by:  Nothing Worsened by:  Nothing tried Ineffective treatments:  None tried Associated symptoms: abdominal pain, fever, hematemesis, light-headedness, loss of consciousness and vomiting      HPI Comments: Jacob Gaines is a 48 y.o. male with past medical history of asthma who presents to the Emergency Department complaining of abdominal pain beginning yesterday, acutely worsening tonight with hematemesis and black stools. He denies prior experience with blood in vomit or black stools. He also reports left-sided, "needle-like" chest pain and tightness; he describes this as his "heart trying to stop." There is a questionable  LOC event tonight: patient felt a shaking, "seizure-like" episode that brought him to the ground while walking. He felt light-headed and then "blacked out." He reports a head injury with that fall; he has a headache and neck pain at this time.   Timeline of events:   1. Patient reports he laid down after eating and woke up with cold sweats and sharp, burning pain radiating from his left side; he stood up and felt dizzy. He reports subjective fever at this time.  2. He went outside and threw up; he reports blood in his vomit (stomach contents with bright red blood). He notes that he did eat red items at dinner (cranberry sauce). He denies drinking alcohol at dinner; he drank Gatorade (one red, one yellow).  3. He believes he fainted while walking to the store to use the bathroom; he fell onto the side walk and "hit his face" against the ground.   4. He went to the bathroom and noticed black stool (3x); his last BM was yesterday and was described as "normal."   He has previously had a cholecystectomy. Patient is an occasional EtOH user ("six pack sometimes"). Pt denies smoking and illicit drug use.   Past Medical History  Diagnosis Date  . ETOH abuse   . Tobacco abuse   . Enlarged heart     "when I was little" (09/18/2012)  . Chest pain     "only related to asthma attack" (09/18/2012)  .  Shortness of breath     " related to asthma attack; sometimes in the mornings too" (09/18/2012)  . Pneumonia   . Anxiety   . Sleep apnea     "I don't wear a mask" (09/18/2012)  . Asthma   . GERD (gastroesophageal reflux disease)   . Headache(784.0)     "only related to asthma attack" (09/18/2012)   Past Surgical History  Procedure Laterality Date  . Laparoscopic cholecystectomy  2007    Performed in [place]  . Nasal sinus surgery Right 10/01/2013    Procedure: ENDOSCOPIC Transantral Repair of Right Orbital Floor Fracture ;  Surgeon: Christia Reading, MD;  Location: [facility] OR;  Service: ENT;  Laterality: Right;    Family History  Problem Relation Age of Onset  . Heart attack Paternal Grandfather 18  . Colon cancer Paternal Grandmother    History  Substance Use Topics  . Smoking status: Current Some Day Smoker -- 0.25 packs/day for .3 years    Types: Cigarettes  . Smokeless tobacco: Never Used  . Alcohol Use: 0.0 oz/week     Comment: 3-4 cans of beer on the weekends    Review of Systems  Constitutional: Positive for fever and diaphoresis. Negative for chills.  HENT: Negative for congestion and sore throat.   Respiratory: Negative for cough.   Cardiovascular: Positive for chest pain.  Gastrointestinal: Positive for nausea, vomiting, abdominal pain, blood in stool, hematochezia and hematemesis. Negative for diarrhea.  Genitourinary: Negative for dysuria, frequency and hematuria.  Musculoskeletal: Positive for neck pain. Negative for back pain.  Skin: Negative for rash.  Neurological: Positive for loss of consciousness, light-headedness and headaches.  Hematological: Does not bruise/bleed easily.  Psychiatric/Behavioral: Negative for confusion.    Allergies  Review of patient's allergies indicates no known allergies.  Home Medications   Prior to Admission medications   Medication Sig Start Date End Date Taking? Authorizing Provider  albuterol (PROVENTIL HFA;VENTOLIN HFA) 108 (90 BASE) MCG/ACT inhaler Inhale 2 puffs into the lungs every 6 (six) hours as needed for wheezing or shortness of breath.    Historical Provider, MD  cephALEXin (KEFLEX) 500 MG capsule Take 1 capsule (500 mg total) by mouth 4 (four) times daily. Patient not taking: Reported on 12/29/2013 10/02/13   [name], MD  HYDROcodone-acetaminophen (NORCO/VICODIN) 5-325 MG per tablet Take 1-2 tablets by mouth every 4 (four) hours as needed for moderate pain. Patient not taking: Reported on 12/29/2013 10/01/13   Christia Reading, MD  HYDROcodone-acetaminophen (NORCO/VICODIN) 5-325 MG per tablet Take 1-2 tablets by mouth every 4  (four) hours as needed for moderate pain. Patient not taking: Reported on 12/29/2013 10/02/13   [name], MD  oxyCODONE-acetaminophen (PERCOCET/ROXICET) 5-325 MG per tablet Take 2 tablets by mouth every 4 (four) hours as needed for moderate pain or severe pain. 09/23/13   Benetta Spar L Creech, PA-C   BP 101/74 mmHg  Pulse 137  Resp 32  Wt 170 lb (77.111 kg)  SpO2 100% Physical Exam  Constitutional: He is oriented to person, place, and time. He appears well-developed and well-nourished.  HENT:  Head: Normocephalic and atraumatic.  Mouth/Throat: Oropharynx is clear and moist. No oropharyngeal exudate.  Faint bruising to the left infraorbital area with mild TTP in this area  Eyes: EOM are normal. Pupils are equal, round, and reactive to light.  Neck: Normal range of motion.  Mild mid cervical TTP  Cardiovascular: Regular rhythm and normal heart sounds.  Exam reveals no gallop and no friction rub.   No  murmur heard. Heart rate 140s  Pulmonary/Chest: Effort normal and breath sounds normal. No respiratory distress. He has no wheezes. He has no rales.  Abdominal: Soft. There is tenderness (Mild tenderness of the LLQ). There is no rebound and no guarding.  Genitourinary:  Normal appearing external rectum. Melanotic stool.   Musculoskeletal: Normal range of motion. He exhibits no edema.  Neurological: He is alert and oriented to person, place, and time.  Skin: Skin is warm and dry. No rash noted.  Psychiatric: He has a normal mood and affect. His behavior is normal.  Nursing note and vitals reviewed.   ED Course  Procedures   DIAGNOSTIC STUDIES: Oxygen Saturation is 100% on RA, normal by my interpretation.    COORDINATION OF CARE: 12:29 AM Discussed treatment plan with pt at bedside and pt agreed to plan.   Labs Review Labs Reviewed - No data to display  Imaging Review Dg Chest 2 View  12/29/2013   CLINICAL DATA:  Left side abdominal pain.  Nausea and vomiting.  EXAM: CHEST  2  VIEW  COMPARISON:  CT chest 11/15/2012.  PA and lateral chest 04/01/2013.  FINDINGS: The lungs are clear. Heart size is upper normal. No pneumothorax or pleural effusion.  IMPRESSION: No acute disease.   Electronically Signed   By: Drusilla Kannerhomas  Dalessio M.D.   On: 12/29/2013 17:34   Ct Head Wo Contrast  12/29/2013   CLINICAL DATA:  Altered mental status.  EXAM: CT HEAD WITHOUT CONTRAST  TECHNIQUE: Contiguous axial images were obtained from the base of the skull through the vertex without intravenous contrast.  COMPARISON:  Head CT scan 09/23/2013 and 01/22/2013.  FINDINGS: The brain appears normal without hemorrhage, infarct, mass lesion, mass effect, midline shift or abnormal extra-axial fluid collection. There is marked mucosal thickening in both maxillary sinuses, worse on the left, with sclerosis in wall thickening seen bilaterally consistent with chronic change. Scattered ethmoid air cell disease is identified. The patient has a remote fracture of the medial wall of the right orbit.  IMPRESSION: No acute intracranial abnormality.  Chronic maxillary sinus disease, worse on the left.   Electronically Signed   By: Drusilla Kannerhomas  Dalessio M.D.   On: 12/29/2013 17:59   Ct Abdomen Pelvis W Contrast  12/29/2013   CLINICAL DATA:  Left upper quadrant pain. Left flank pain. Altered mental status.  EXAM: CT ABDOMEN AND PELVIS WITH CONTRAST  TECHNIQUE: Multidetector CT imaging of the abdomen and pelvis was performed using the standard protocol following bolus administration of intravenous contrast.  CONTRAST:  100mL OMNIPAQUE IOHEXOL 300 MG/ML  SOLN  COMPARISON:  07/31/2013  FINDINGS: Lower Chest:  Unremarkable.  Hepatobiliary: No masses identified. Mild steatosis noted. Surgical clips seen from prior cholecystectomy.  Pancreas: No mass, inflammatory changes, or other parenchymal abnormality identified.  Spleen:  Within normal limits in size and appearance.  Adrenal Glands:  No mass identified.  Kidneys/Urinary Tract: No  masses identified. Mild right hydronephrosis and ureterectasis noted. No obstructing calculus visualized. Distended urinary bladder again noted, without evidence of focal wall thickening or mass.  Stomach/Bowel/Peritoneum: Descending colon diverticulosis again noted. No evidence of diverticulitis. Distal esophageal wall thickening also seen, suspicious for esophagitis.  Vascular/Lymphatic: No pathologically enlarged lymph nodes identified. No other significant abnormality identified.  Reproductive:  Mildly enlarged prostate.  Other:  None.  Musculoskeletal:  No suspicious bone lesions identified.  IMPRESSION: Mildly enlarged prostate and distended urinary bladder. Suggest clinical correlation for urinary retention.  Mild right hydronephrosis and ureterectasis. No obstructing calculus visualized.  This may be due to vesicoureteral reflux.  Mild distal esophageal wall thickening, suspicious for esophagitis. Consider endoscopy for further evaluation.  Diverticulosis. No radiographic evidence of diverticulitis.  Mild hepatic steatosis.   Electronically Signed   By: Myles RosenthalJohn  Stahl M.D.   On: 12/29/2013 18:12     EKG Interpretation   Date/Time:  Friday December 31 2013 00:06:14 EST Ventricular Rate:  134 PR Interval:  131 QRS Duration: 76 QT Interval:  294 QTC Calculation: 439 R Axis:   86 Text Interpretation:  Sinus tachycardia No significant change since last  tracing Confirmed by Viktorya Arguijo  MD, Jacklyne Baik ([id number]) on 12/31/2013 12:14:28  AM     CRITICAL CARE Performed by: Purvis SheffieldHARRISON, Aleisa Howk, S Total critical care time: 30 min Critical care time was exclusive of separately billable procedures and treating other patients. Critical care was necessary to treat or prevent imminent or life-threatening deterioration. Critical care was time spent personally by me on the following activities: development of treatment plan with patient and/or surrogate as well as nursing, discussions with consultants, evaluation of  patient's response to treatment, examination of patient, obtaining history from patient or surrogate, ordering and performing treatments and interventions, ordering and review of laboratory studies, ordering and review of radiographic studies, pulse oximetry and re-evaluation of patient's condition.  MDM   Final diagnoses:  Chest pain  Fall  Gastrointestinal hemorrhage with melena   12:55 AM 48 y.o. male w hx of etoh abuse who presents with complaint of continued left lower abdominal pain, hematemesis, dark stools, and episode of syncope which occurred this evening. He also has some left-sided chest pain. He is afebrile and tachycardic here. Melanotic appearing stool. We'll get screening labs and imaging. Of note the patient was seen here within the last 48 hours and had a noncontributory CT scan of his abdomen and pelvis.  Pts HR in the 140's has improved w/ IVF. He has melanotic stool. Will start protonix gtt. Will plan on admission to stepdown unit for gi bleed.     I personally performed the services described in this documentation, which was scribed in my presence. The recorded information has been reviewed and is accurate.      Purvis SheffieldForrest Lorra Freeman, MD 12/31/13 [phone]

## 2013-12-31 NOTE — H&P (Addendum)
[facility] History and Physical  MEREDITH MELLS ZOX:[id number] DOB: 1965-04-07 DOA: 12/31/2013  Referring physician: EDP PCP: No PCP Per Patient   Chief Complaint: GI bleed   HPI: Jacob Gaines is a 48 y.o. male h/o EtOH abuse who presents to the ED with c/o abdominal pain onset yesterday with acute worsening tonight.  Patient reports he laid down after eating tonight and woke up with sharp burning pain radiating from his left side, he stood up and felt dizzy.  He went outside and threw up, vomit appears red though he did eat red items at dinner tonight (cranberry sauce and red gatorade).  He then went to bathroom and and had melanotic stool.  Last BM was yesterday and was "normal".  He called 911 and was brought to ED.  Review of Systems: Systems reviewed.  As above, otherwise negative  Past Medical History  Diagnosis Date  . ETOH abuse   . Tobacco abuse   . Enlarged heart     "when I was little" (09/18/2012)  . Chest pain     "only related to asthma attack" (09/18/2012)  . Shortness of breath     " related to asthma attack; sometimes in the mornings too" (09/18/2012)  . Pneumonia   . Anxiety   . Sleep apnea     "I don't wear a mask" (09/18/2012)  . Asthma   . GERD (gastroesophageal reflux disease)   . Headache(784.0)     "only related to asthma attack" (09/18/2012)   Past Surgical History  Procedure Laterality Date  . Laparoscopic cholecystectomy  2007    Performed in [place]  . Nasal sinus surgery Right 10/01/2013    Procedure: ENDOSCOPIC Transantral Repair of Right Orbital Floor Fracture ;  Surgeon: Christia Reading, MD;  Location: [facility] OR;  Service: ENT;  Laterality: Right;   Social History:  reports that he has been smoking Cigarettes.  He has a .075 pack-year smoking history. He has never used smokeless tobacco. He reports that he drinks alcohol. He reports that he does not use illicit drugs.  No Known Allergies  Family History  Problem Relation Age of Onset  .  Heart attack Paternal Grandfather 68  . Colon cancer Paternal Grandmother      Prior to Admission medications   Medication Sig Start Date End Date Taking? Authorizing Provider  albuterol (PROVENTIL HFA;VENTOLIN HFA) 108 (90 BASE) MCG/ACT inhaler Inhale 2 puffs into the lungs every 6 (six) hours as needed for wheezing or shortness of breath.   Yes Historical Provider, MD  oxyCODONE-acetaminophen (PERCOCET/ROXICET) 5-325 MG per tablet Take 2 tablets by mouth every 4 (four) hours as needed for moderate pain or severe pain. 09/23/13  Yes Louann Sjogren, PA-C   Physical Exam: Filed Vitals:   12/31/13 0230  BP: 98/59  Pulse: 95  Temp:   Resp: 19    BP 98/59 mmHg  Pulse 95  Temp(Src) 98.1 F (36.7 C) (Oral)  Resp 19  Wt 77.111 kg (170 lb)  SpO2 97%  General Appearance:    Alert, oriented, no distress, appears stated age  Head:    Normocephalic, atraumatic  Eyes:    PERRL, EOMI, sclera non-icteric        Nose:   Nares without drainage or epistaxis. Mucosa, turbinates normal  Throat:   Moist mucous membranes. Oropharynx without erythema or exudate.  Neck:   Supple. No carotid bruits.  No thyromegaly.  No lymphadenopathy.   Back:     No CVA tenderness,  no spinal tenderness  Lungs:     Clear to auscultation bilaterally, without wheezes, rhonchi or rales  Chest wall:    No tenderness to palpitation  Heart:    Regular rate and rhythm without murmurs, gallops, rubs  Abdomen:     Soft, non-tender, nondistended, normal bowel sounds, no organomegaly  Genitalia:    deferred  Rectal:    deferred  Extremities:   No clubbing, cyanosis or edema.  Pulses:   2+ and symmetric all extremities  Skin:   Skin color, texture, turgor normal, no rashes or lesions  Lymph nodes:   Cervical, supraclavicular, and axillary nodes normal  Neurologic:   CNII-XII intact. Normal strength, sensation and reflexes      throughout    Labs on Admission:  Basic Metabolic Panel:  Recent Labs Lab 12/29/13 1441  12/29/13 1953 12/31/13 0100  NA 140 143 140  K 4.0 4.0 4.5  CL 104 112 105  CO2 18* 19 20  GLUCOSE 121* 102* 236*  BUN 10 7 39*  CREATININE 0.81 0.70 0.79  CALCIUM 8.2* 6.8* 8.4   Liver Function Tests:  Recent Labs Lab 12/29/13 1441 12/31/13 0100  AST 22 14  ALT 32 22  ALKPHOS 122* 85  BILITOT <0.2* 0.7  PROT 6.8 6.0  ALBUMIN 4.0 3.2*    Recent Labs Lab 12/29/13 1441 12/31/13 0100  LIPASE 62* 27   No results for input(s): AMMONIA in the last 168 hours. CBC:  Recent Labs Lab 12/29/13 1441 12/31/13 0100  WBC 5.5 11.2*  NEUTROABS 3.1 9.5*  HGB 14.5 11.3*  HCT 41.7 32.4*  MCV 89.1 91.0  PLT 169 173   Cardiac Enzymes:  Recent Labs Lab 12/29/13 1938 12/31/13 0100  CKTOTAL 200  --   TROPONINI <0.30 <0.30    BNP (last 3 results) No results for input(s): PROBNP in the last 8760 hours. CBG: No results for input(s): GLUCAP in the last 168 hours.  Radiological Exams on Admission: Dg Chest 2 View  12/31/2013   CLINICAL DATA:  Chest pain and asthma  EXAM: CHEST  2 VIEW  COMPARISON:  12/29/2013  FINDINGS: The heart size and mediastinal contours are within normal limits. Both lungs are clear. The visualized skeletal structures are unremarkable.  IMPRESSION: No active cardiopulmonary disease.   Electronically Signed   By: Signa Kell M.D.   On: 12/31/2013 01:49   Dg Chest 2 View  12/29/2013   CLINICAL DATA:  Left side abdominal pain.  Nausea and vomiting.  EXAM: CHEST  2 VIEW  COMPARISON:  CT chest 11/15/2012.  PA and lateral chest 04/01/2013.  FINDINGS: The lungs are clear. Heart size is upper normal. No pneumothorax or pleural effusion.  IMPRESSION: No acute disease.   Electronically Signed   By: Drusilla Kanner M.D.   On: 12/29/2013 17:34   Ct Head Wo Contrast  12/31/2013   CLINICAL DATA:  Black bowel with fall and head trauma. Headache, facial pain, neck pain.  EXAM: CT HEAD WITHOUT CONTRAST  CT MAXILLOFACIAL WITHOUT CONTRAST  CT CERVICAL SPINE WITHOUT  CONTRAST  TECHNIQUE: Multidetector CT imaging of the head, cervical spine, and maxillofacial structures were performed using the standard protocol without intravenous contrast. Multiplanar CT image reconstructions of the cervical spine and maxillofacial structures were also generated.  COMPARISON:  12/29/2013 head CT.  09/18/2013 face CT  FINDINGS: CT HEAD FINDINGS  Skull and Sinuses:No calvarial fracture. Facial findings are described below.  Orbits: No acute abnormality.  Brain: No evidence of acute abnormality,  such as acute infarction, hemorrhage, hydrocephalus, or mass lesion/mass effect.  CT MAXILLOFACIAL FINDINGS  No acute facial fracture. The mandible is located. Healed appearance of medial and inferior right orbital blowout fractures, acute on imaging 09/18/2013. Right intraconal hemorrhage and edema has resolved. Currently there is no evidence of globe injury or postseptal hematoma. Dysconjugate gaze, usually incidental. The right eye is deviated temporally, not expected if there was a medial rectus entrapment. Chronic deformity of the nasal arch and anterior nasal septum.  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 There is frothy secretions present within the left maxillary sinus, but a maxillary antrostomy or secondary ostium is widely patent. 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  CT CERVICAL SPINE FINDINGS  Negative for acute fracture or subluxation. No prevertebral edema. No gross cervical canal hematoma. Spondylotic changes in the mid and lower cervical spine. No significant osseous canal or foraminal stenosis.  IMPRESSION: 1. No evidence of intracranial, facial or cervical spine acute injury. 2. Stable appearance of remote right orbital blowout fractures. 3. Chronic sinusitis.   Electronically Signed   By: Tiburcio Pea M.D.   On: 12/31/2013 02:18   Ct Head Wo Contrast  12/29/2013   CLINICAL DATA:  Altered mental status.  EXAM: CT  HEAD WITHOUT CONTRAST  TECHNIQUE: Contiguous axial images were obtained from the base of the skull through the vertex without intravenous contrast.  COMPARISON:  Head CT scan 09/23/2013 and 01/22/2013.  FINDINGS: The brain appears normal without hemorrhage, infarct, mass lesion, mass effect, midline shift or abnormal extra-axial fluid collection. There is marked mucosal thickening in both maxillary sinuses, worse on the left, with sclerosis in wall thickening seen bilaterally consistent with chronic change. Scattered ethmoid air cell disease is identified. The patient has a remote fracture of the medial wall of the right orbit.  IMPRESSION: No acute intracranial abnormality.  Chronic maxillary sinus disease, worse on the left.   Electronically Signed   By: Drusilla Kanner M.D.   On: 12/29/2013 17:59   Ct Cervical Spine Wo Contrast  12/31/2013   CLINICAL DATA:  Black bowel with fall and head trauma. Headache, facial pain, neck pain.  EXAM: CT HEAD WITHOUT CONTRAST  CT MAXILLOFACIAL WITHOUT CONTRAST  CT CERVICAL SPINE WITHOUT CONTRAST  TECHNIQUE: Multidetector CT imaging of the head, cervical spine, and maxillofacial structures were performed using the standard protocol without intravenous contrast. Multiplanar CT image reconstructions of the cervical spine and maxillofacial structures were also generated.  COMPARISON:  12/29/2013 head CT.  09/18/2013 face CT  FINDINGS: CT HEAD FINDINGS  Skull and Sinuses:No calvarial fracture. Facial findings are described below.  Orbits: No acute abnormality.  Brain: No evidence of acute abnormality, such as acute infarction, hemorrhage, hydrocephalus, or mass lesion/mass effect.  CT MAXILLOFACIAL FINDINGS  No acute facial fracture. The mandible is located. Healed appearance of medial and inferior right orbital blowout fractures, acute on imaging 09/18/2013. Right intraconal hemorrhage and edema has resolved. Currently there is no evidence of globe injury or postseptal  hematoma. Dysconjugate gaze, usually incidental. The right eye is deviated temporally, not expected if there was a medial rectus entrapment. Chronic deformity of the nasal arch and anterior nasal septum.  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 There is frothy secretions present within the left maxillary sinus, but a maxillary antrostomy or secondary ostium is widely patent. 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  CT CERVICAL SPINE FINDINGS  Negative for  acute fracture or subluxation. No prevertebral edema. No gross cervical canal hematoma. Spondylotic changes in the mid and lower cervical spine. No significant osseous canal or foraminal stenosis.  IMPRESSION: 1. No evidence of intracranial, facial or cervical spine acute injury. 2. Stable appearance of remote right orbital blowout fractures. 3. Chronic sinusitis.   Electronically Signed   By: Tiburcio PeaJonathan  Watts M.D.   On: 12/31/2013 02:18   Ct Abdomen Pelvis W Contrast  12/29/2013   CLINICAL DATA:  Left upper quadrant pain. Left flank pain. Altered mental status.  EXAM: CT ABDOMEN AND PELVIS WITH CONTRAST  TECHNIQUE: Multidetector CT imaging of the abdomen and pelvis was performed using the standard protocol following bolus administration of intravenous contrast.  CONTRAST:  100mL OMNIPAQUE IOHEXOL 300 MG/ML  SOLN  COMPARISON:  07/31/2013  FINDINGS: Lower Chest:  Unremarkable.  Hepatobiliary: No masses identified. Mild steatosis noted. Surgical clips seen from prior cholecystectomy.  Pancreas: No mass, inflammatory changes, or other parenchymal abnormality identified.  Spleen:  Within normal limits in size and appearance.  Adrenal Glands:  No mass identified.  Kidneys/Urinary Tract: No masses identified. Mild right hydronephrosis and ureterectasis noted. No obstructing calculus visualized. Distended urinary bladder again noted, without evidence of focal wall thickening or mass.   Stomach/Bowel/Peritoneum: Descending colon diverticulosis again noted. No evidence of diverticulitis. Distal esophageal wall thickening also seen, suspicious for esophagitis.  Vascular/Lymphatic: No pathologically enlarged lymph nodes identified. No other significant abnormality identified.  Reproductive:  Mildly enlarged prostate.  Other:  None.  Musculoskeletal:  No suspicious bone lesions identified.  IMPRESSION: Mildly enlarged prostate and distended urinary bladder. Suggest clinical correlation for urinary retention.  Mild right hydronephrosis and ureterectasis. No obstructing calculus visualized. This may be due to vesicoureteral reflux.  Mild distal esophageal wall thickening, suspicious for esophagitis. Consider endoscopy for further evaluation.  Diverticulosis. No radiographic evidence of diverticulitis.  Mild hepatic steatosis.   Electronically Signed   By: Myles RosenthalJohn  Stahl M.D.   On: 12/29/2013 18:12   Ct Maxillofacial Wo Cm  12/31/2013   CLINICAL DATA:  Black bowel with fall and head trauma. Headache, facial pain, neck pain.  EXAM: CT HEAD WITHOUT CONTRAST  CT MAXILLOFACIAL WITHOUT CONTRAST  CT CERVICAL SPINE WITHOUT CONTRAST  TECHNIQUE: Multidetector CT imaging of the head, cervical spine, and maxillofacial structures were performed using the standard protocol without intravenous contrast. Multiplanar CT image reconstructions of the cervical spine and maxillofacial structures were also generated.  COMPARISON:  12/29/2013 head CT.  09/18/2013 face CT  FINDINGS: CT HEAD FINDINGS  Skull and Sinuses:No calvarial fracture. Facial findings are described below.  Orbits: No acute abnormality.  Brain: No evidence of acute abnormality, such as acute infarction, hemorrhage, hydrocephalus, or mass lesion/mass effect.  CT MAXILLOFACIAL FINDINGS  No acute facial fracture. The mandible is located. Healed appearance of medial and inferior right orbital blowout fractures, acute on imaging 09/18/2013. Right intraconal  hemorrhage and edema has resolved. Currently there is no evidence of globe injury or postseptal hematoma. Dysconjugate gaze, usually incidental. The right eye is deviated temporally, not expected if there was a medial rectus entrapment. Chronic deformity of the nasal arch and anterior nasal septum.  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 There is frothy secretions present within the left maxillary sinus, but a maxillary antrostomy or secondary ostium is widely patent. 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  CT CERVICAL SPINE FINDINGS  Negative for acute fracture or subluxation. No prevertebral edema. No gross cervical canal  hematoma. Spondylotic changes in the mid and lower cervical spine. No significant osseous canal or foraminal stenosis.  IMPRESSION: 1. No evidence of intracranial, facial or cervical spine acute injury. 2. Stable appearance of remote right orbital blowout fractures. 3. Chronic sinusitis.   Electronically Signed   By: Tiburcio PeaJonathan  Watts M.D.   On: 12/31/2013 02:18    EKG: Independently reviewed.  Assessment/Plan Principal Problem:   Melena Active Problems:   Alcohol abuse   GI bleed   Hyperglycemia   1. GI bleed and Melena - no further melena in ED nor episodes of hematemesis thus far 1. Tele monitor 2. Repeat CBC in AM 3. IVF - 3L bolus in ED and NS at 125 cc/hr 4. Tachycardia of 137 on presentation to ED has resolved, HR now 95 after IVF. 5. BPs have been steady with SBP in the upper 90s now and low 110s since arrival.  Believe the drop may be due to 2mg  dilaudid. 6. Protonix gtt 7. Call GI in AM for EGD 8. NPO 9. No known history of cirrhosis, portal hypertension, esophageal varices to indicate octreotide at this time. 2. EtOH abuse - on EtOH withdrawal prevention protocol, not actively withdrawing at this time. 3. Hyperglycemia - CBG 230 in ED today, will put on Q4H CBGs while NPO and see what he does, no  known DM diagnosis, if this does not correct then may need to start low dose SSI.    Code Status: Full Code  Family Communication: No family in room Disposition Plan: Admit to inpatient   Time spent: 70 min  Shanvi Moyd M. [facility] Pager [phone]  If 7AM-7PM, please contact the day team taking care of the patient [email or web address] Password [email or web address] 12/31/2013, 3:02 AM

## 2013-12-31 NOTE — ED Notes (Signed)
Per EMS states was called to gas station by patient. Patient reports black diarrhea with chest pain that radiates to left shoulder and left upper abdomen pain. EMS reports patient vomited bright red blood. Patient states light headed ness and fainting at gas station.

## 2013-12-31 NOTE — ED Notes (Signed)
Assisted placing pt in a gown, hooking up to cardiac monitor, bp cuff and pulse ox

## 2014-01-01 ENCOUNTER — Encounter (HOSPITAL_COMMUNITY): Payer: Self-pay | Admitting: *Deleted

## 2014-01-01 ENCOUNTER — Encounter (HOSPITAL_COMMUNITY)
Admission: EM | Disposition: A | Discharge status: Home / Self Care | Payer: Self-pay | Source: Home / Self Care | Attending: Internal Medicine

## 2014-01-01 HISTORY — PX: ESOPHAGOGASTRODUODENOSCOPY: SHX5428

## 2014-01-01 LAB — GLUCOSE, CAPILLARY
GLUCOSE-CAPILLARY: 100 mg/dL — AB (ref 70–99)
Glucose-Capillary: 92 mg/dL (ref 70–99)
Glucose-Capillary: 154 mg/dL — ABNORMAL HIGH (ref 70–99)

## 2014-01-01 LAB — CBC
HEMATOCRIT: 23.8 % — AB (ref 39.0–52.0)
HEMATOCRIT: 23.2 % — AB (ref 39.0–52.0)
Hemoglobin: 8.1 g/dL — ABNORMAL LOW (ref 13.0–17.0)
Hemoglobin: 8.3 g/dL — ABNORMAL LOW (ref 13.0–17.0)
MCH: 30.7 pg (ref 26.0–34.0)
MCH: 31.9 pg (ref 26.0–34.0)
MCHC: 34.9 g/dL (ref 30.0–36.0)
MCHC: 34.9 g/dL (ref 30.0–36.0)
MCV: 88.1 fL (ref 78.0–100.0)
MCV: 91.3 fL (ref 78.0–100.0)
Platelets: 120 10*3/uL — ABNORMAL LOW (ref 150–400)
Platelets: 132 10*3/uL — ABNORMAL LOW (ref 150–400)
RBC: 2.7 MIL/uL — ABNORMAL LOW (ref 4.22–5.81)
RBC: 2.54 MIL/uL — AB (ref 4.22–5.81)
RDW: 12.9 % (ref 11.5–15.5)
RDW: 13 % (ref 11.5–15.5)
WBC: 5.7 10*3/uL (ref 4.0–10.5)
WBC: 4.1 10*3/uL (ref 4.0–10.5)

## 2014-01-01 LAB — LIPASE, BLOOD: LIPASE: 22 U/L (ref 11–59)

## 2014-01-01 LAB — HEMOGLOBIN A1C
Hgb A1c MFr Bld: 6 % — ABNORMAL HIGH (ref <5.7)
Mean Plasma Glucose: 126 mg/dL — ABNORMAL HIGH (ref <117)

## 2014-01-01 SURGERY — EGD (ESOPHAGOGASTRODUODENOSCOPY)
Anesthesia: Moderate Sedation

## 2014-01-01 MED ORDER — FENTANYL CITRATE 0.05 MG/ML IJ SOLN
INTRAMUSCULAR | Status: AC
  Filled 2014-01-01: qty 2

## 2014-01-01 MED ORDER — FENTANYL CITRATE 0.05 MG/ML IJ SOLN
INTRAMUSCULAR | Status: DC | PRN
  Administered 2014-01-01: 25 ug via INTRAVENOUS

## 2014-01-01 MED ORDER — MIDAZOLAM HCL 5 MG/ML IJ SOLN
INTRAMUSCULAR | Status: AC
  Filled 2014-01-01: qty 1

## 2014-01-01 MED ORDER — SUCRALFATE 1 GM/10ML PO SUSP
1.0000 g | Freq: Three times a day (TID) | ORAL | Status: DC
  Administered 2014-01-01 – 2014-01-03 (×8): 1 g via ORAL
  Filled 2014-01-01 (×12): qty 10

## 2014-01-01 MED ORDER — MIDAZOLAM HCL 10 MG/2ML IJ SOLN
INTRAMUSCULAR | Status: DC | PRN
  Administered 2014-01-01: 1 mg via INTRAVENOUS
  Administered 2014-01-01: 2 mg via INTRAVENOUS

## 2014-01-01 NOTE — Progress Notes (Signed)
[place] TEAM 1 - Stepdown/ICU TEAM Progress Note  Bary RichardDonald K Frappier ZHY:[id number] DOB: [birth year] DOA: 12/31/2013 PCP: No PCP Per Patient  Admit HPI / Brief Narrative: 48 yo male h/o EtOH abuse who presented to the ED with c/o 1 day of abdominal pain with acute worsening the night of admit. Patient reported he woke up with sharp burning pain radiating from his left side.  When he stood up he felt dizzy. He then vomitted red material, though he did eat red items at dinner (cranberry sauce and red gatorade). He then had a melanotic stool. He called 911 and was brought to ED.  HPI/Subjective: Pt c/o some modest abdom pain and a sensation of early satiety.  No cp, sob, or vomiting.  Assessment/Plan:  UGIB - hematemesis and melena EGD noted LA Grade C esophagitis in the setting of a small sliding hiatal hernia, mild gastritis, and moderate duodenitis - plan for PPI QD - plan repeat EGD in 8 weeks to check for Barrett's - lipase normal - advance diet and follow   Acute blood loss w/o anemia  Hgb 11.3 at presentation - nadir thus far 8.1 - need to assure Hgb stable prior to d/c home  Modest hypotension  Cont to hydrate - follow BP trend   Hyperglycemia  A1c pending   EtOH abuse  Counseled on absolute need to stop drinking - no evidence of withdrawal  Tobacco abuse  counseled on need to abstain  Sleep apnea  Noncompliant w/ CPAP   Code Status: FULL Family Communication: no family present at time of exam Disposition Plan: SDU  Consultants: GI  Procedures: EGD - 11/28 - LA Grade C esophagitis in the setting of a small sliding hiatal hernia - Mild gastritis - Moderate duodenitis  Antibiotics: none  DVT prophylaxis: SCDs  Objective: Blood pressure 101/80, pulse 71, temperature 98.8 F (37.1 C), temperature source Oral, resp. rate 16, height 5\' 5"  (1.651 m), weight 73.9 kg (162 lb 14.7 oz), SpO2 98 %.  Intake/Output Summary (Last 24 hours) at 01/01/14 1533 Last data  filed at 01/01/14 1225  Gross per 24 hour  Intake   2755 ml  Output   1276 ml  Net   1479 ml   Exam: General: No acute respiratory distress Lungs: Clear to auscultation bilaterally without wheezes or crackles Cardiovascular: Regular rate and rhythm without murmur gallop or rub normal S1 and S2 Abdomen: Nontender, nondistended, soft, bowel sounds positive, no rebound, no ascites, no appreciable mass Extremities: No significant cyanosis, clubbing, or edema bilateral lower extremities  Data Reviewed: Basic Metabolic Panel:  Recent Labs Lab 12/29/13 1441 12/29/13 1953 12/31/13 0100 12/31/13 0450  NA 140 143 140 145  K 4.0 4.0 4.5 4.5  CL 104 112 105 112  CO2 18* 19 20 23   GLUCOSE 121* 102* 236* 122*  BUN 10 7 39* 29*  CREATININE 0.81 0.70 0.79 0.68  CALCIUM 8.2* 6.8* 8.4 7.2*    Liver Function Tests:  Recent Labs Lab 12/29/13 1441 12/31/13 0100  AST 22 14  ALT 32 22  ALKPHOS 122* 85  BILITOT <0.2* 0.7  PROT 6.8 6.0  ALBUMIN 4.0 3.2*    Recent Labs Lab 12/29/13 1441 12/31/13 0100 01/01/14 0010  LIPASE 62* 27 22   Coags:  Recent Labs Lab 12/31/13 0100  INR 1.13   CBC:  Recent Labs Lab 12/29/13 1441 12/31/13 0100 12/31/13 0450 12/31/13 1329 01/01/14 0010  WBC 5.5 11.2* 7.7 5.3 4.1  NEUTROABS 3.1 9.5*  --   --   --  HGB 14.5 11.3* 9.2* 8.9* 8.1*  HCT 41.7 32.4* 26.6* 25.5* 23.2*  MCV 89.1 91.0 91.4 91.4 91.3  PLT 169 173 135* 125* 120*    Cardiac Enzymes:  Recent Labs Lab 12/29/13 1938 12/31/13 0100  CKTOTAL 200  --   TROPONINI <0.30 <0.30   CBG:  Recent Labs Lab 12/31/13 1820 12/31/13 1914 12/31/13 2331 01/01/14 0325 01/01/14 1220  GLUCAP 164* 182* 100* 92 154*    No results found for this or any previous visit (from the past 240 hour(s)).   Studies:  Recent x-ray studies have been reviewed in detail by the Attending Physician  Scheduled Meds:  Scheduled Meds: . folic acid  1 mg Oral Daily  . multivitamin with  minerals  1 tablet Oral Daily  . [START ON 01/03/2014] pantoprazole (PROTONIX) IV  40 mg Intravenous Q12H  . sodium chloride  3 mL Intravenous Q12H  . thiamine  100 mg Oral Daily   Or  . thiamine  100 mg Intravenous Daily    Time spent on care of this patient: 35 mins   Chantia Amalfitano T , MD   [facility] Office  [phone] Pager - Text Page per Loretha StaplerAmion as per below:  On-Call/Text Page:      [email or web address]      password [email or web address]  If 7PM-7AM, please contact night-coverage [email or web address] Password [email or web address] 01/01/2014, 3:33 PM   LOS: 1 day

## 2014-01-01 NOTE — Interval H&P Note (Signed)
History and Physical Interval Note:  01/01/2014 8:01 AM  Jacob Gaines  has presented today for surgery, with the diagnosis of hematemesis, abdominal pain  The various methods of treatment have been discussed with the patient and family. After consideration of risks, benefits and other options for treatment, the patient has consented to  Procedure(s): ESOPHAGOGASTRODUODENOSCOPY (EGD) (N/A) as a surgical intervention .  The patient's history has been reviewed, patient examined, no change in status, stable for surgery.  I have reviewed the patient's chart and labs.  Questions were answered to the patient's satisfaction.     Navah Grondin D

## 2014-01-01 NOTE — Op Note (Signed)
[facility] [address] [place], [id number]   ENDOSCOPY PROCEDURE REPORT  PATIENT: Jacob RichardSanchez, [name]  MR#: [id number] BIRTHDATE: Jun 29, 1965 , 48  yrs. old GENDER: male ENDOSCOPIST:Marcey Persad Elnoria HowardHung, MD REFERRED BY: PROCEDURE DATE:  01/01/2014 PROCEDURE:   EGD w/ biopsy ASA CLASS:    Class II INDICATIONS: Left sided abdominal pain MEDICATION: Versed 3 mg IV and Fentanyl 25 mcg IV TOPICAL ANESTHETIC:   none  DESCRIPTION OF PROCEDURE:   After the risks and benefits of the procedure were explained, informed consent was obtained.  The Pentax Gastroscope [id number]  endoscope was introduced through the mouth  and advanced to the second portion of the duodenum .  The instrument was slowly withdrawn as the mucosa was fully examined.    FINDINGS: In the distal esophagus an LA Grade C esophagitis was identified.  A small sliding hiatal hernia was also noted.  In the antrum an isolated patch of gastritis was biopsied.  In the duodenal bulb a moderate duodenitis was noted.    Retroflexed views revealed no abnormalities.    The scope was then withdrawn from the patient and the procedure completed.  COMPLICATIONS: There were no immediate complications.  ENDOSCOPIC IMPRESSION: 1) LA Grade C esophagitis in the setting of a small sliding hiatal hernia. 2) Mild gastritis. 3) Moderate duodenitis.  RECOMMENDATIONS: 1) PPI QD. 2) Consider a repeat EGD in 8 weeks to check for underlying evidence of Barrett's esophagus.   _______________________________ eSignedJeani Hawking:  Javier Gell, MD 01/01/2014 8:12 AM     cc:  CPT CODES: ICD CODES:  The ICD and CPT codes recommended by this software are interpretations from the data that the clinical staff has captured with the software.  The verification of the translation of this report to the ICD and CPT codes and modifiers is the sole responsibility of the health care institution and practicing physician where this report  was generated.  [employer] will not be held responsible for the validity of the ICD and CPT codes included on this report.  AMA assumes no liability for data contained or not contained herein. CPT is a Publishing rights managerregistered trademark of the [employer].

## 2014-01-01 NOTE — H&P (View-Only) (Signed)
Consultation  Referring Provider:     [facility] Primary Care Physician:  No PCP Per Patient Primary Gastroenterologist:  none       Reason for Consultation: GI bleed             HPI:   Jacob Gaines is a 48 y.o. male who presented to ED today with abdominal pain, nausea/vomiting,hematemesis and melena. ETOH level 291 in ED. Lipase 62. Hgb 8.9, it was 16 in August. Initial BUN 39, down to 29 today.   Patient was napping yesterday when he was awoken by left sided abdominal pain. Upon sitting up he became nauseated then vomited dark red blood material which he believes was blood though he did have red colored fluids to drink yesterday. Following that he developed black stools. The abdominal pain involves whole left abdomen. Patient denies any previous history of abdominal pain or GI bleeding. He takes an occas NSAID. No chronic GI problems. Patient also complains of chest pain.   Past Medical History  Diagnosis Date  . ETOH abuse   . Tobacco abuse   . Enlarged heart     "when I was little" (09/18/2012)  . Chest pain     "only related to asthma attack" (09/18/2012)  . Shortness of breath     " related to asthma attack; sometimes in the mornings too" (09/18/2012)  . Pneumonia   . Anxiety   . Sleep apnea     "I don't wear a mask" (09/18/2012)  . Asthma   . GERD (gastroesophageal reflux disease)   . Headache(784.0)     "only related to asthma attack" (09/18/2012)    Past Surgical History  Procedure Laterality Date  . Laparoscopic cholecystectomy  2007    Performed in [place]  . Nasal sinus surgery Right 10/01/2013    Procedure: ENDOSCOPIC Transantral Repair of Right Orbital Floor Fracture ;  Surgeon: Christia Reading, MD;  Location: [facility] OR;  Service: ENT;  Laterality: Right;    Family History  Problem Relation Age of Onset  . Heart attack Paternal Grandfather 17  . Colon cancer Paternal Grandmother      History  Substance Use Topics  . Smoking status: Current Some Day  Smoker -- 0.25 packs/day for .3 years    Types: Cigarettes  . Smokeless tobacco: Never Used  . Alcohol Use: 0.0 oz/week     Comment: 3-4 cans of beer on the weekends    Prior to Admission medications   Medication Sig Start Date End Date Taking? Authorizing Provider  albuterol (PROVENTIL HFA;VENTOLIN HFA) 108 (90 BASE) MCG/ACT inhaler Inhale 2 puffs into the lungs every 6 (six) hours as needed for wheezing or shortness of breath.   Yes Historical Provider, MD  oxyCODONE-acetaminophen (PERCOCET/ROXICET) 5-325 MG per tablet Take 2 tablets by mouth every 4 (four) hours as needed for moderate pain or severe pain. 09/23/13  Yes Louann Sjogren, PA-C    Current Facility-Administered Medications  Medication Dose Route Frequency Provider Last Rate Last Dose  . 0.9 %  sodium chloride infusion   Intravenous Continuous Hillary Bow, DO 125 mL/hr at 12/31/13 0413    . albuterol (PROVENTIL) (2.5 MG/3ML) 0.083% nebulizer solution 2.5 mg  2.5 mg Nebulization Q6H PRN Hillary Bow, DO      . folic acid (FOLVITE) tablet 1 mg  1 mg Oral Daily Hillary Bow, DO   1 mg at 12/31/13 1011  . LORazepam (ATIVAN) tablet 1 mg  1 mg Oral  Q6H PRN Hillary Bow, DO       Or  . LORazepam (ATIVAN) injection 1 mg  1 mg Intravenous Q6H PRN Hillary Bow, DO   1 mg at 12/31/13 1610  . multivitamin with minerals tablet 1 tablet  1 tablet Oral Daily Hillary Bow, DO   1 tablet at 12/31/13 1011  . pantoprazole (PROTONIX) 80 mg in sodium chloride 0.9 % 250 mL (0.32 mg/mL) infusion  8 mg/hr Intravenous Continuous Purvis Sheffield, MD 25 mL/hr at 12/31/13 0116 8 mg/hr at 12/31/13 0116  . [START ON 01/03/2014] pantoprazole (PROTONIX) injection 40 mg  40 mg Intravenous Q12H Purvis Sheffield, MD      . sodium chloride 0.9 % injection 3 mL  3 mL Intravenous Q12H Hillary Bow, DO   3 mL at 12/31/13 0413  . thiamine (VITAMIN B-1) tablet 100 mg  100 mg Oral Daily Hillary Bow, DO   100 mg at 12/31/13 1010   Or  .  thiamine (B-1) injection 100 mg  100 mg Intravenous Daily Hillary Bow, DO        Allergies as of 12/30/2013  . (No Known Allergies)   Review of Systems:    All systems reviewed and negative except where noted in HPI.   Physical Exam:  Vital signs in last 24 hours: Temp:  [98 F (36.7 C)-98.1 F (36.7 C)] 98 F (36.7 C) (11/27 1510) Pulse Rate:  [74-137] 82 (11/27 1249) Resp:  [13-32] 23 (11/27 1249) BP: (92-116)/(57-80) 106/69 mmHg (11/27 1249) SpO2:  [94 %-100 %] 100 % (11/27 1249) Weight:  [162 lb 14.7 oz (73.9 kg)-170 lb (77.111 kg)] 162 lb 14.7 oz (73.9 kg) (11/27 1249)   General:   Pleasant Hispanic male Head:  Normocephalic and atraumatic. Eyes:   No icterus.   Conjunctiva pink. Ears:  Normal auditory acuity. Neck:  Supple; no masses felt Lungs:  Respirations even and unlabored. Lungs clear to auscultation bilaterally.   No wheezes, crackles, or rhonchi.  Heart:  Regular rate and rhythm;  murmur heard. Abdomen:  Soft, nondistended, nontender. Normal bowel sounds. No appreciable masses or hepatomegaly.  Rectal:  Heme positive in ED.  Msk:  Symmetrical without gross deformities.  Extremities:  Without edema. Neurologic:  Alert and  oriented x4;  grossly normal neurologically. Skin:  Intact without significant lesions or rashes. Cervical Nodes:  No significant cervical adenopathy. Psych:  Alert and cooperative. Normal affect.  LAB RESULTS:  Recent Labs  12/31/13 0100 12/31/13 0450 12/31/13 1329  WBC 11.2* 7.7 5.3  HGB 11.3* 9.2* 8.9*  HCT 32.4* 26.6* 25.5*  PLT 173 135* 125*   BMET  Recent Labs  12/29/13 1953 12/31/13 0100 12/31/13 0450  NA 143 140 145  K 4.0 4.5 4.5  CL 112 105 112  CO2 19 20 23   GLUCOSE 102* 236* 122*  BUN 7 39* 29*  CREATININE 0.70 0.79 0.68  CALCIUM 6.8* 8.4 7.2*   LFT  Recent Labs  12/31/13 0100  PROT 6.0  ALBUMIN 3.2*  AST 14  ALT 22  ALKPHOS 85  BILITOT 0.7   PT/INR  Recent Labs  12/31/13 0100  LABPROT  14.6  INR 1.13    STUDIES: Dg Chest 2 View  12/31/2013   CLINICAL DATA:  Chest pain and asthma  EXAM: CHEST  2 VIEW  COMPARISON:  12/29/2013  FINDINGS: The heart size and mediastinal contours are within normal limits. Both lungs are clear. The visualized skeletal structures are unremarkable.  IMPRESSION: No active cardiopulmonary disease.   Electronically Signed   By: Signa Kell M.D.   On: 12/31/2013 01:49   Dg Chest 2 View  12/29/2013   CLINICAL DATA:  Left side abdominal pain.  Nausea and vomiting.  EXAM: CHEST  2 VIEW  COMPARISON:  CT chest 11/15/2012.  PA and lateral chest 04/01/2013.  FINDINGS: The lungs are clear. Heart size is upper normal. No pneumothorax or pleural effusion.  IMPRESSION: No acute disease.   Electronically Signed   By: Drusilla Kanner M.D.   On: 12/29/2013 17:34   Ct Head Wo Contrast  12/31/2013   CLINICAL DATA:  Black bowel with fall and head trauma. Headache, facial pain, neck pain.  EXAM: CT HEAD WITHOUT CONTRAST  CT MAXILLOFACIAL WITHOUT CONTRAST  CT CERVICAL SPINE WITHOUT CONTRAST  TECHNIQUE: Multidetector CT imaging of the head, cervical spine, and maxillofacial structures were performed using the standard protocol without intravenous contrast. Multiplanar CT image reconstructions of the cervical spine and maxillofacial structures were also generated.  COMPARISON:  12/29/2013 head CT.  09/18/2013 face CT  FINDINGS: CT HEAD FINDINGS  Skull and Sinuses:No calvarial fracture. Facial findings are described below.  Orbits: No acute abnormality.  Brain: No evidence of acute abnormality, such as acute infarction, hemorrhage, hydrocephalus, or mass lesion/mass effect.  CT MAXILLOFACIAL FINDINGS  No acute facial fracture. The mandible is located. Healed appearance of medial and inferior right orbital blowout fractures, acute on imaging 09/18/2013. Right intraconal hemorrhage and edema has resolved. Currently there is no evidence of globe injury or postseptal hematoma.  Dysconjugate gaze, usually incidental. The right eye is deviated temporally, not expected if there was a medial rectus entrapment. Chronic deformity of the nasal arch and anterior nasal septum.  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 There is frothy secretions present within the left maxillary sinus, but a maxillary antrostomy or secondary ostium is widely patent. 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  CT CERVICAL SPINE FINDINGS  Negative for acute fracture or subluxation. No prevertebral edema. No gross cervical canal hematoma. Spondylotic changes in the mid and lower cervical spine. No significant osseous canal or foraminal stenosis.  IMPRESSION: 1. No evidence of intracranial, facial or cervical spine acute injury. 2. Stable appearance of remote right orbital blowout fractures. 3. Chronic sinusitis.   Electronically Signed   By: Tiburcio Pea M.D.   On: 12/31/2013 02:18   Ct Head Wo Contrast  12/29/2013   CLINICAL DATA:  Altered mental status.  EXAM: CT HEAD WITHOUT CONTRAST  TECHNIQUE: Contiguous axial images were obtained from the base of the skull through the vertex without intravenous contrast.  COMPARISON:  Head CT scan 09/23/2013 and 01/22/2013.  FINDINGS: The brain appears normal without hemorrhage, infarct, mass lesion, mass effect, midline shift or abnormal extra-axial fluid collection. There is marked mucosal thickening in both maxillary sinuses, worse on the left, with sclerosis in wall thickening seen bilaterally consistent with chronic change. Scattered ethmoid air cell disease is identified. The patient has a remote fracture of the medial wall of the right orbit.  IMPRESSION: No acute intracranial abnormality.  Chronic maxillary sinus disease, worse on the left.   Electronically Signed   By: Drusilla Kanner M.D.   On: 12/29/2013 17:59   Ct Cervical Spine Wo Contrast  12/31/2013   CLINICAL DATA:  Black bowel with fall and  head trauma. Headache, facial pain, neck pain.  EXAM: CT HEAD WITHOUT CONTRAST  CT MAXILLOFACIAL WITHOUT CONTRAST  CT CERVICAL SPINE WITHOUT CONTRAST  TECHNIQUE: Multidetector CT imaging of the head, cervical spine, and maxillofacial structures were performed using the standard protocol without intravenous contrast. Multiplanar CT image reconstructions of the cervical spine and maxillofacial structures were also generated.  COMPARISON:  12/29/2013 head CT.  09/18/2013 face CT  FINDINGS: CT HEAD FINDINGS  Skull and Sinuses:No calvarial fracture. Facial findings are described below.  Orbits: No acute abnormality.  Brain: No evidence of acute abnormality, such as acute infarction, hemorrhage, hydrocephalus, or mass lesion/mass effect.  CT MAXILLOFACIAL FINDINGS  No acute facial fracture. The mandible is located. Healed appearance of medial and inferior right orbital blowout fractures, acute on imaging 09/18/2013. Right intraconal hemorrhage and edema has resolved. Currently there is no evidence of globe injury or postseptal hematoma. Dysconjugate gaze, usually incidental. The right eye is deviated temporally, not expected if there was a medial rectus entrapment. Chronic deformity of the nasal arch and anterior nasal septum.  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 There is frothy secretions present within the left maxillary sinus, but a maxillary antrostomy or secondary ostium is widely patent. 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  CT CERVICAL SPINE FINDINGS  Negative for acute fracture or subluxation. No prevertebral edema. No gross cervical canal hematoma. Spondylotic changes in the mid and lower cervical spine. No significant osseous canal or foraminal stenosis.  IMPRESSION: 1. No evidence of intracranial, facial or cervical spine acute injury. 2. Stable appearance of remote right orbital blowout fractures. 3. Chronic sinusitis.   Electronically  Signed   By: Tiburcio PeaJonathan  Watts M.D.   On: 12/31/2013 02:18   Ct Abdomen Pelvis W Contrast  12/29/2013   CLINICAL DATA:  Left upper quadrant pain. Left flank pain. Altered mental status.  EXAM: CT ABDOMEN AND PELVIS WITH CONTRAST  TECHNIQUE: Multidetector CT imaging of the abdomen and pelvis was performed using the standard protocol following bolus administration of intravenous contrast.  CONTRAST:  100mL OMNIPAQUE IOHEXOL 300 MG/ML  SOLN  COMPARISON:  07/31/2013  FINDINGS: Lower Chest:  Unremarkable.  Hepatobiliary: No masses identified. Mild steatosis noted. Surgical clips seen from prior cholecystectomy.  Pancreas: No mass, inflammatory changes, or other parenchymal abnormality identified.  Spleen:  Within normal limits in size and appearance.  Adrenal Glands:  No mass identified.  Kidneys/Urinary Tract: No masses identified. Mild right hydronephrosis and ureterectasis noted. No obstructing calculus visualized. Distended urinary bladder again noted, without evidence of focal wall thickening or mass.  Stomach/Bowel/Peritoneum: Descending colon diverticulosis again noted. No evidence of diverticulitis. Distal esophageal wall thickening also seen, suspicious for esophagitis.  Vascular/Lymphatic: No pathologically enlarged lymph nodes identified. No other significant abnormality identified.  Reproductive:  Mildly enlarged prostate.  Other:  None.  Musculoskeletal:  No suspicious bone lesions identified.  IMPRESSION: Mildly enlarged prostate and distended urinary bladder. Suggest clinical correlation for urinary retention.  Mild right hydronephrosis and ureterectasis. No obstructing calculus visualized. This may be due to vesicoureteral reflux.  Mild distal esophageal wall thickening, suspicious for esophagitis. Consider endoscopy for further evaluation.  Diverticulosis. No radiographic evidence of diverticulitis.  Mild hepatic steatosis.   Electronically Signed   By: Myles RosenthalJohn  Stahl M.D.   On: 12/29/2013 18:12   Ct  Maxillofacial Wo Cm  12/31/2013   CLINICAL DATA:  Black bowel with fall and head trauma. Headache, facial pain, neck pain.  EXAM: CT HEAD WITHOUT CONTRAST  CT MAXILLOFACIAL WITHOUT CONTRAST  CT CERVICAL SPINE WITHOUT CONTRAST  TECHNIQUE: Multidetector CT imaging of  the head, cervical spine, and maxillofacial structures were performed using the standard protocol without intravenous contrast. Multiplanar CT image reconstructions of the cervical spine and maxillofacial structures were also generated.  COMPARISON:  12/29/2013 head CT.  09/18/2013 face CT  FINDINGS: CT HEAD FINDINGS  Skull and Sinuses:No calvarial fracture. Facial findings are described below.  Orbits: No acute abnormality.  Brain: No evidence of acute abnormality, such as acute infarction, hemorrhage, hydrocephalus, or mass lesion/mass effect.  CT MAXILLOFACIAL FINDINGS  No acute facial fracture. The mandible is located. Healed appearance of medial and inferior right orbital blowout fractures, acute on imaging 09/18/2013. Right intraconal hemorrhage and edema has resolved. Currently there is no evidence of globe injury or postseptal hematoma. Dysconjugate gaze, usually incidental. The right eye is deviated temporally, not expected if there was a medial rectus entrapment. Chronic deformity of the nasal arch and anterior nasal septum.  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 There is frothy secretions present within the left maxillary sinus, but a maxillary antrostomy or secondary ostium is widely patent. 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  CT CERVICAL SPINE FINDINGS  Negative for acute fracture or subluxation. No prevertebral edema. No gross cervical canal hematoma. Spondylotic changes in the mid and lower cervical spine. No significant osseous canal or foraminal stenosis.  IMPRESSION: 1. No evidence of intracranial, facial or cervical spine acute injury. 2. Stable appearance of  remote right orbital blowout fractures. 3. Chronic sinusitis.   Electronically Signed   By: Tiburcio PeaJonathan  Watts M.D.   On: 12/31/2013 02:18   PREVIOUS ENDOSCOPIES:            none   Impression / Plan:   551. 48 year old Hispanic male GI bleed (hematemesis / melena). He had red fluids prior to vomiting but stools are heme positive and hgb is down significantly from baseline. Rule out PUD. Patient was tachycardic on admission, HR improved with IVF. Continue PPI drip, prn anti-emetics/analgesics.  Patient needs EGD for further evaluation. The benefits, risks, and potential complications of EGD with possible biopsies  were discussed with the patient and he agrees to proceed.   2. Acute, diffuse left sided abdominal pain. Again, rule out PUD. Lipase minimally elevated making acute pancreatitis but if EGD negative then may need CTscan. Of note he is s/p cholecystectomy.    3. Anemia of acute blood loss. Hgb in 8 range, down from baseline of around 16. Continue to follow, he may need blood if continues to drop.    4. ETOH abuse, intoxicated in ED today. No stigmata of chronic liver disease on exam. Coags okay, LFTs normal.   5. Thrombocytopenia, probably secondary to acute bleed and /or bone marrow suppression from ETOH  Thanks   LOS: 0 days   Willette Clusteraula Guenther  12/31/2013, 4:03 PM   GI Attending Note   Chart was reviewed and patient was examined. X-rays and lab were reviewed.    I agree with management and plans. CT does not demonstrate any acute abnormalities. Suspect active PUD vs gastritis, possible MW tear.  Plans as discussed above.  Barbette Hairobert D. Arlyce DiceKaplan, M.D., [facility]AC[facility] [phone]

## 2014-01-02 LAB — COMPREHENSIVE METABOLIC PANEL
ALT: 20 U/L (ref 0–53)
ANION GAP: 10 (ref 5–15)
AST: 15 U/L (ref 0–37)
Albumin: 2.8 g/dL — ABNORMAL LOW (ref 3.5–5.2)
Alkaline Phosphatase: 72 U/L (ref 39–117)
BILIRUBIN TOTAL: 0.2 mg/dL — AB (ref 0.3–1.2)
BUN: 5 mg/dL — AB (ref 6–23)
CALCIUM: 8.2 mg/dL — AB (ref 8.4–10.5)
CHLORIDE: 105 meq/L (ref 96–112)
CO2: 27 mEq/L (ref 19–32)
CREATININE: 0.8 mg/dL (ref 0.50–1.35)
GFR calc Af Amer: >90 mL/min (ref >90)
Glucose, Bld: 113 mg/dL — ABNORMAL HIGH (ref 70–99)
Potassium: 3.7 mEq/L (ref 3.7–5.3)
Sodium: 142 mEq/L (ref 137–147)
Total Protein: 5.3 g/dL — ABNORMAL LOW (ref 6.0–8.3)

## 2014-01-02 LAB — CBC
HCT: 22.3 % — ABNORMAL LOW (ref 39.0–52.0)
Hemoglobin: 7.7 g/dL — ABNORMAL LOW (ref 13.0–17.0)
MCH: 31 pg (ref 26.0–34.0)
MCHC: 34.5 g/dL (ref 30.0–36.0)
MCV: 89.9 fL (ref 78.0–100.0)
PLATELETS: 112 10*3/uL — AB (ref 150–400)
RBC: 2.48 MIL/uL — AB (ref 4.22–5.81)
RDW: 12.7 % (ref 11.5–15.5)
WBC: 4 10*3/uL (ref 4.0–10.5)

## 2014-01-02 MED ORDER — SENNOSIDES-DOCUSATE SODIUM 8.6-50 MG PO TABS
1.0000 | ORAL_TABLET | Freq: Two times a day (BID) | ORAL | Status: DC
  Administered 2014-01-02 (×2): 1 via ORAL
  Filled 2014-01-02 (×3): qty 1

## 2014-01-02 MED ORDER — LACTULOSE 10 GM/15ML PO SOLN
30.0000 g | Freq: Two times a day (BID) | ORAL | Status: DC
  Administered 2014-01-02 (×2): 30 g via ORAL
  Filled 2014-01-02 (×4): qty 45

## 2014-01-02 MED ORDER — MORPHINE SULFATE 2 MG/ML IJ SOLN
1.0000 mg | INTRAMUSCULAR | Status: DC | PRN
  Administered 2014-01-02 – 2014-01-03 (×3): 2 mg via INTRAVENOUS
  Filled 2014-01-02 (×4): qty 1

## 2014-01-02 MED ORDER — ALBUTEROL SULFATE (2.5 MG/3ML) 0.083% IN NEBU: 2.5000 mg | INHALATION_SOLUTION | RESPIRATORY_TRACT | Status: DC | PRN

## 2014-01-02 NOTE — Plan of Care (Signed)
Problem: Phase II Progression Outcomes Goal: Tolerating diet Outcome: Progressing     

## 2014-01-02 NOTE — Progress Notes (Signed)
[facility]

## 2014-01-02 NOTE — Progress Notes (Signed)
Progress Note for [facility]  Subjective: No acute events.  He feels somewhat better.  Still with some abdominal cramping.  Objective: Vital signs in last 24 hours: Temp:  [98.2 F (36.8 C)-98.9 F (37.2 C)] 98.9 F (37.2 C) (11/29 0733) Pulse Rate:  [71-95] 72 (11/29 0733) Resp:  [12-20] 17 (11/29 0733) BP: (90-116)/(52-80) 102/68 mmHg (11/29 0733) SpO2:  [95 %-99 %] 99 % (11/29 0733) Last BM Date: 01/01/14  Intake/Output from previous day: 11/28 0701 - 11/29 0700 In: 3994.6 [P.O.:720; I.V.:3274.6] Out: 3026 [Urine:3026] Intake/Output this shift:    General appearance: alert and no distress GI: some upper periumbilical tenderness  Lab Results:  Recent Labs  01/01/14 0010 01/01/14 1653 01/02/14 0232  WBC 4.1 5.7 4.0  HGB 8.1* 8.3* 7.7*  HCT 23.2* 23.8* 22.3*  PLT 120* 132* 112*   BMET  Recent Labs  12/31/13 0100 12/31/13 0450 01/02/14 0232  NA 140 145 142  K 4.5 4.5 3.7  CL 105 112 105  CO2 20 23 27   GLUCOSE 236* 122* 113*  BUN 39* 29* 5*  CREATININE 0.79 0.68 0.80  CALCIUM 8.4 7.2* 8.2*   LFT  Recent Labs  01/02/14 0232  PROT 5.3*  ALBUMIN 2.8*  AST 15  ALT 20  ALKPHOS 72  BILITOT 0.2*   PT/INR  Recent Labs  12/31/13 0100  LABPROT 14.6  INR 1.13   Hepatitis Panel No results for input(s): HEPBSAG, HCVAB, HEPAIGM, HEPBIGM in the last 72 hours. C-Diff No results for input(s): CDIFFTOX in the last 72 hours. Fecal Lactopherrin No results for input(s): FECLLACTOFRN in the last 72 hours.  Studies/Results: No results found.  Medications:  Scheduled: . folic acid  1 mg Oral Daily  . multivitamin with minerals  1 tablet Oral Daily  . [START ON 01/03/2014] pantoprazole (PROTONIX) IV  40 mg Intravenous Q12H  . sodium chloride  3 mL Intravenous Q12H  . sucralfate  1 g Oral TID WC & HS  . thiamine  100 mg Oral Daily   Continuous: . sodium chloride 100 mL/hr at 01/02/14 0600  . pantoprozole (PROTONIX) infusion 8 mg/hr (01/02/14 0600)     Assessment/Plan: 1) LA Grade C esophagitis. 2) Gastritis. 3) Duodenitis.   He seems to have made some improvement, but he continues to complain of abdominal pain.  Plan: 1) Continue with PPI. 2) [facility] to resume care in the AM.   LOS: 2 days   Mulan Adan D 01/02/2014, 8:46 AM

## 2014-01-02 NOTE — Progress Notes (Signed)
TEAM 1 - Stepdown/ICU TEAM Progress Note  Bary RichardDonald K Simkin ZOX:[id number] DOB: [birth year] DOA: 12/31/2013 PCP: No PCP Per Patient  Admit HPI / Brief Narrative: 48 yo male h/o EtOH abuse who presented to the ED with c/o 1 day of abdominal pain with acute worsening the night of admit. Patient reported he woke up with sharp burning pain radiating from his left side.  When he stood up he felt dizzy. He then vomitted red material, though he did eat red items at dinner (cranberry sauce and red gatorade). He then had a melanotic stool. He called 911 and was brought to ED.  Since admission he has undergone and EGD noting esophagitis, gastritis, and duodenitis, all likely due to EtOH abuse.  He does not appear to be bleeding any more, but he has had difficulty with oral intake due to pain, as well as constipation.  His hgb has not yet reached a clear nadir/begun to climb again.  Despite his hx of EtOH abuse, he has not yet displayed sx of withdrawal.    HPI/Subjective: Pt c/o ongoing difficulty eating due to pain.  Also c/o constipation, passing only small volume tarry stool.  Denies cp or sob.  Relates some diaphoresis and dizziness during spells of epigastric pain.    Assessment/Plan:  UGIB (hematemesis and melena) - esophagitis, gastritis, duodenitis EGD noted LA Grade C esophagitis in the setting of a small sliding hiatal hernia, mild gastritis, and moderate duodenitis - plan for PPI QD - plan repeat EGD in 8 weeks to check for Barrett's - lipase normal - c/o signif epigastric pain w/ attempts to advance diet so downgraded to full liquid and added carafate - suspect having vagal sx related to pain - hold on advancing diet for now - attempt to stimulate bowels/improve mobility   Acute blood loss w/o anemia  Hgb 11.3 at presentation - nadir thus far 7.7 - need to assure Hgb stable prior to d/c home - suspect current decline due to hydration - slow IVF and recheck in AM - hemodynamically  stable at present   Modest hypotension  Improved with hydration - slow IVF and follow trend   Hyperglycemia  A1c 6.0 / not c/w DM yet - will need intermittent outpt monitoring   EtOH abuse  Counseled on absolute need to stop drinking - no evidence of withdrawal thus far - LFTs normal   Tobacco abuse  counseled on need to abstain  Sleep apnea  Noncompliant w/ CPAP   Code Status: FULL Family Communication: no family present at time of exam Disposition Plan: stable for transfer to medical bed   Consultants: GI  Procedures: EGD - 11/28 - LA Grade C esophagitis in the setting of a small sliding hiatal hernia - Mild gastritis - Moderate duodenitis  Antibiotics: none  DVT prophylaxis: SCDs  Objective: Blood pressure 124/76, pulse 94, temperature 98.2 F (36.8 C), temperature source Oral, resp. rate 18, height 5\' 5"  (1.651 m), weight 73.9 kg (162 lb 14.7 oz), SpO2 98 %.  Intake/Output Summary (Last 24 hours) at 01/02/14 1339 Last data filed at 01/02/14 1314  Gross per 24 hour  Intake 3789.58 ml  Output   4020 ml  Net -230.42 ml   Exam: General: No acute respiratory distress Lungs: Clear to auscultation bilaterally without wheezes or crackles Cardiovascular: Regular rate and rhythm without murmur gallop or rub  Abdomen: mildly tender th/o epigastrium, nondistended, soft, bowel sounds positive, no rebound, no ascites, no appreciable mass Extremities: No significant cyanosis,  clubbing, or edema bilateral lower extremities  Data Reviewed: Basic Metabolic Panel:  Recent Labs Lab 12/29/13 1441 12/29/13 1953 12/31/13 0100 12/31/13 0450 01/02/14 0232  NA 140 143 140 145 142  K 4.0 4.0 4.5 4.5 3.7  CL 104 112 105 112 105  CO2 18* 19 20 23 27   GLUCOSE 121* 102* 236* 122* 113*  BUN 10 7 39* 29* 5*  CREATININE 0.81 0.70 0.79 0.68 0.80  CALCIUM 8.2* 6.8* 8.4 7.2* 8.2*    Liver Function Tests:  Recent Labs Lab 12/29/13 1441 12/31/13 0100 01/02/14 0232  AST 22  14 15   ALT 32 22 20  ALKPHOS 122* 85 72  BILITOT <0.2* 0.7 0.2*  PROT 6.8 6.0 5.3*  ALBUMIN 4.0 3.2* 2.8*    Recent Labs Lab 12/29/13 1441 12/31/13 0100 01/01/14 0010  LIPASE 62* 27 22   Coags:  Recent Labs Lab 12/31/13 0100  INR 1.13   CBC:  Recent Labs Lab 12/29/13 1441 12/31/13 0100 12/31/13 0450 12/31/13 1329 01/01/14 0010 01/01/14 1653 01/02/14 0232  WBC 5.5 11.2* 7.7 5.3 4.1 5.7 4.0  NEUTROABS 3.1 9.5*  --   --   --   --   --   HGB 14.5 11.3* 9.2* 8.9* 8.1* 8.3* 7.7*  HCT 41.7 32.4* 26.6* 25.5* 23.2* 23.8* 22.3*  MCV 89.1 91.0 91.4 91.4 91.3 88.1 89.9  PLT 169 173 135* 125* 120* 132* 112*    Cardiac Enzymes:  Recent Labs Lab 12/29/13 1938 12/31/13 0100  CKTOTAL 200  --   TROPONINI <0.30 <0.30   CBG:  Recent Labs Lab 12/31/13 1820 12/31/13 1914 12/31/13 2331 01/01/14 0325 01/01/14 1220  GLUCAP 164* 182* 100* 92 154*    Studies:  Recent x-ray studies have been reviewed in detail by the Attending Physician  Scheduled Meds:  Scheduled Meds: . folic acid  1 mg Oral Daily  . multivitamin with minerals  1 tablet Oral Daily  . [START ON 01/03/2014] pantoprazole (PROTONIX) IV  40 mg Intravenous Q12H  . sodium chloride  3 mL Intravenous Q12H  . sucralfate  1 g Oral TID WC & HS  . thiamine  100 mg Oral Daily    Time spent on care of this patient: 35 mins   Marcine Gadway T , MD   [facility] Office  [phone] Pager - Text Page per Loretha StaplerAmion as per below:  On-Call/Text Page:      [email or web address]      password [email or web address]  If 7PM-7AM, please contact night-coverage [email or web address] Password [email or web address] 01/02/2014, 1:39 PM   LOS: 2 days

## 2014-01-02 NOTE — Progress Notes (Signed)
Arrived to 5w06 from 3c. Instructed on call light usage and room surroundings. Denies nausea, c/o pain 7/10. Sitting in recliner at this time

## 2014-01-03 ENCOUNTER — Encounter (HOSPITAL_COMMUNITY): Payer: Self-pay | Admitting: Gastroenterology

## 2014-01-03 LAB — COMPREHENSIVE METABOLIC PANEL
ALK PHOS: 76 U/L (ref 39–117)
ALT: 20 U/L (ref 0–53)
ANION GAP: 9 (ref 5–15)
AST: 14 U/L (ref 0–37)
Albumin: 3 g/dL — ABNORMAL LOW (ref 3.5–5.2)
BUN: 5 mg/dL — ABNORMAL LOW (ref 6–23)
CHLORIDE: 106 meq/L (ref 96–112)
CO2: 26 mEq/L (ref 19–32)
Calcium: 8.4 mg/dL (ref 8.4–10.5)
Creatinine, Ser: 0.79 mg/dL (ref 0.50–1.35)
GFR calc Af Amer: >90 mL/min (ref >90)
GFR calc non Af Amer: >90 mL/min (ref >90)
GLUCOSE: 108 mg/dL — AB (ref 70–99)
POTASSIUM: 3.8 meq/L (ref 3.7–5.3)
SODIUM: 141 meq/L (ref 137–147)
TOTAL PROTEIN: 5.6 g/dL — AB (ref 6.0–8.3)
Total Bilirubin: 0.2 mg/dL — ABNORMAL LOW (ref 0.3–1.2)

## 2014-01-03 LAB — CBC
HCT: 23.7 % — ABNORMAL LOW (ref 39.0–52.0)
HEMOGLOBIN: 8.3 g/dL — AB (ref 13.0–17.0)
MCH: 31.7 pg (ref 26.0–34.0)
MCHC: 35 g/dL (ref 30.0–36.0)
MCV: 90.5 fL (ref 78.0–100.0)
Platelets: 148 10*3/uL — ABNORMAL LOW (ref 150–400)
RBC: 2.62 MIL/uL — ABNORMAL LOW (ref 4.22–5.81)
RDW: 13.1 % (ref 11.5–15.5)
WBC: 4.4 10*3/uL (ref 4.0–10.5)

## 2014-01-03 MED ORDER — LOPERAMIDE HCL 2 MG PO CAPS: 2.0000 mg | ORAL_CAPSULE | Freq: Four times a day (QID) | ORAL | Status: DC | PRN

## 2014-01-03 MED ORDER — ACETAMINOPHEN 325 MG PO TABS: 650.0000 mg | ORAL_TABLET | ORAL | Status: DC | PRN

## 2014-01-03 MED ORDER — PANTOPRAZOLE SODIUM 40 MG PO TBEC
40.0000 mg | DELAYED_RELEASE_TABLET | Freq: Every day | ORAL | Status: DC
  Administered 2014-01-04: 40 mg via ORAL
  Filled 2014-01-03: qty 1

## 2014-01-03 NOTE — Progress Notes (Signed)
[facility] TEAM 1 - Stepdown/ICU TEAM Progress Note  Jacob Gaines JXB:[id number] DOB: Jul 12, 1965 DOA: 12/31/2013 PCP: No PCP Per Patient  Admit HPI / Brief Narrative: 48 yo male h/o EtOH abuse who presented to the ED with c/o 1 day of abdominal pain with acute worsening the night of admit. Patient reported he woke up with sharp burning pain radiating from his left side.  When he stood up he felt dizzy. He then vomitted red material, though he did eat red items at dinner (cranberry sauce and red gatorade). He then had a melanotic stool. He called 911 and was brought to ED.  Since admission he has undergone and EGD noting esophagitis, gastritis, and duodenitis, all likely due to EtOH abuse.  He does not appear to be bleeding any more, but he has had difficulty with oral intake due to pain, as well as constipation.  His hgb has not yet reached a clear nadir/begun to climb again.  Despite his hx of EtOH abuse, he has not yet displayed sx of withdrawal.    HPI/Subjective: Pt complains of multiple loose BMs and cramping abdominal pain. No nausea. Agreeable to advance diet to solids today.  Assessment/Plan:  UGIB (hematemesis and melena) - esophagitis, gastritis, duodenitis EGD noted LA Grade C esophagitis in the setting of a small sliding hiatal hernia, mild gastritis, and moderate duodenitis - plan for PPI QD - plan repeat EGD in 8 weeks to check for Barrett's - lipase normal  - c/o signif epigastric pain w/ attempts to advance diet so downgraded to full liquid and added carafate - suspect having vagal sx related to pain -  - given laxative to stimulate bowels/improve mobility but now having increased cramping and diarrhea - GI recommends stopping all laxative and narcotics- f/u on biopsy results- see GI note from today -  Diet advanced to solids today  Acute blood loss w/o anemia  Hgb 11.3 at presentation - nadir thus far 7.7 - noted to be 8.3 today - suspect current decline due to  hydration -  hemodynamically stable at present   Modest hypotension  Improved with hydration -will d/c IVF today  Hyperglycemia  A1c 6.0 / not c/w DM yet - will need intermittent outpt monitoring   EtOH abuse  Counseled on absolute need to stop drinking - no evidence of withdrawal thus far - LFTs normal   Tobacco abuse  counseled on need to abstain  Sleep apnea  Noncompliant w/ CPAP   Code Status: FULL Family Communication: no family present at time of exam Disposition Plan: home once able to tolerate a regular diet  Consultants: GI  Procedures: EGD - 11/28 - LA Grade C esophagitis in the setting of a small sliding hiatal hernia - Mild gastritis - Moderate duodenitis  Antibiotics: none  DVT prophylaxis: SCDs  Objective: Blood pressure 136/82, pulse 79, temperature 98.3 F (36.8 C), temperature source Oral, resp. rate 18, height 5\' 5"  (1.651 m), weight 73.9 kg (162 lb 14.7 oz), SpO2 98 %.  Intake/Output Summary (Last 24 hours) at 01/03/14 1540 Last data filed at 01/03/14 1400  Gross per 24 hour  Intake   1201 ml  Output      0 ml  Net   1201 ml   Exam: General: No acute respiratory distress Lungs: Clear to auscultation bilaterally without wheezes or crackles Cardiovascular: Regular rate and rhythm without murmur gallop or rub  Abdomen: mildly tender th/o epigastrium, nondistended, soft, bowel sounds positive, no rebound, no ascites, no appreciable  mass Extremities: No significant cyanosis, clubbing, or edema bilateral lower extremities  Data Reviewed: Basic Metabolic Panel:  Recent Labs Lab 12/29/13 1953 12/31/13 0100 12/31/13 0450 01/02/14 0232 01/03/14 0446  NA 143 140 145 142 141  K 4.0 4.5 4.5 3.7 3.8  CL 112 105 112 105 106  CO2 19 20 23 27 26   GLUCOSE 102* 236* 122* 113* 108*  BUN 7 39* 29* 5* 5*  CREATININE 0.70 0.79 0.68 0.80 0.79  CALCIUM 6.8* 8.4 7.2* 8.2* 8.4    Liver Function Tests:  Recent Labs Lab 12/29/13 1441 12/31/13 0100  01/02/14 0232 01/03/14 0446  AST 22 14 15 14   ALT 32 22 20 20   ALKPHOS 122* 85 72 76  BILITOT <0.2* 0.7 0.2* 0.2*  PROT 6.8 6.0 5.3* 5.6*  ALBUMIN 4.0 3.2* 2.8* 3.0*    Recent Labs Lab 12/29/13 1441 12/31/13 0100 01/01/14 0010  LIPASE 62* 27 22   Coags:  Recent Labs Lab 12/31/13 0100  INR 1.13   CBC:  Recent Labs Lab 12/29/13 1441 12/31/13 0100  12/31/13 1329 01/01/14 0010 01/01/14 1653 01/02/14 0232 01/03/14 0446  WBC 5.5 11.2*  < > 5.3 4.1 5.7 4.0 4.4  NEUTROABS 3.1 9.5*  --   --   --   --   --   --   HGB 14.5 11.3*  < > 8.9* 8.1* 8.3* 7.7* 8.3*  HCT 41.7 32.4*  < > 25.5* 23.2* 23.8* 22.3* 23.7*  MCV 89.1 91.0  < > 91.4 91.3 88.1 89.9 90.5  PLT 169 173  < > 125* 120* 132* 112* 148*  < > = values in this interval not displayed.  Cardiac Enzymes:  Recent Labs Lab 12/29/13 1938 12/31/13 0100  CKTOTAL 200  --   TROPONINI <0.30 <0.30   CBG:  Recent Labs Lab 12/31/13 1820 12/31/13 1914 12/31/13 2331 01/01/14 0325 01/01/14 1220  GLUCAP 164* 182* 100* 92 154*    Studies:  Recent x-ray studies have been reviewed in detail by the Attending Physician  Scheduled Meds:  Scheduled Meds: . folic acid  1 mg Oral Daily  . multivitamin with minerals  1 tablet Oral Daily  . [START ON 01/04/2014] pantoprazole  40 mg Oral Q0600  . sodium chloride  3 mL Intravenous Q12H  . thiamine  100 mg Oral Daily    Time spent on care of this patient: 35 mins   Calvert CantorIZWAN,Jacob Gaines , MD   [facility] Office  [phone] Pager - Text Page per Loretha StaplerAmion as per below:  On-Call/Text Page:      [email or web address]      password [email or web address]  If 7PM-7AM, please contact night-coverage [email or web address] Password [email or web address] 01/03/2014, 3:40 PM   LOS: 3 days

## 2014-01-03 NOTE — Evaluation (Signed)
Physical Therapy Evaluation Patient Details Name: Bary RichardDonald K Harari MRN: [id number] DOB: 1965-08-30 Today's Date: 01/03/2014   History of Present Illness    Bary RichardDonald K Follett is a 48 y.o. male h/o EtOH abuse who presents to the ED with c/o abdominal pain onset yesterday with acute worsening tonight. Patient reports he laid down after eating tonight and woke up with sharp burning pain radiating from his left side, he stood up and felt dizzy. He went outside and threw up, vomit appears red though he did eat red items at dinner tonight (cranberry sauce and red gatorade). He then went to bathroom and and had melanotic stool. Last BM was yesterday and was "normal". He called 911 and was brought to ED.   Clinical Impression  Pt was stable and safe with all mobility from a PT standpoint and did not require an AD for ambulation. Pt is near baseline level of functioning but requires a little extra time for ambulation but remains safe. Pt able to transfer with mod independence. Pt is safe to discharge with brothers for assistance initially at discharge. Pt does not require further acute PT follow up and does not require outpatient PT at discharge.       No PT follow up    Equipment Recommendations  None recommended by PT    Recommendations for Other Services       Precautions / Restrictions Precautions Precautions: Fall Restrictions Weight Bearing Restrictions: No      Mobility  Bed Mobility                  Transfers Overall transfer level: Modified independent Equipment used: None             General transfer comment: Pt able to sit down and stand up from lowered bed safely without use of UE. Pt requires increased time for mobility.   Ambulation/Gait Ambulation/Gait assistance: Supervision Ambulation Distance (Feet): 120 Feet Assistive device: None Gait Pattern/deviations: Step-through pattern;Decreased stride length Gait velocity: Decreased Gait velocity  interpretation: Below normal speed for age/gender General Gait Details: Pt found to be standing at sink when PT came into room. Pt ambulated safely in hallway without AD. Pt had decreased stride length and was slow with ambulation but had no loss of balance or unsteadiness. Pt able to answer PT's questions while ambulating.   Stairs            Wheelchair Mobility    Modified Rankin (Stroke Patients Only)       Balance  Pt able to stand while PT put second gown on pt to cover back and donned gait belt. Pt was talking to PT during this transaction and had no unsteadiness or loss of balance.                                            Pertinent Vitals/Pain Pain Assessment: 0-10 Pain Score: 3  Pain Location: Abdomen  Pain Intervention(s): Limited activity within patient's tolerance;Monitored during session    Home Living Family/patient expects to be discharged to:: Private residence Living Arrangements: Alone Available Help at Discharge: Family Type of Home: House Home Access: Level entry     Home Layout: One level Home Equipment: None Additional Comments: Pt stated that he lives at a friends house usually but will be staying with his two brothers at discharge.    Prior Function Level of  Independence: Independent               Hand Dominance        Extremity/Trunk Assessment               Lower Extremity Assessment: Overall WFL for tasks assessed         Communication   Communication: No difficulties  Cognition Arousal/Alertness: Awake/alert Behavior During Therapy: WFL for tasks assessed/performed Overall Cognitive Status: Within Functional Limits for tasks assessed                      General Comments      Exercises        Assessment/Plan    PT Assessment Patent does not need any further PT services  PT Diagnosis Difficulty walking   PT Problem List    PT Treatment Interventions     PT Goals (Current  goals can be found in the Care Plan section)      Frequency     Barriers to discharge        Co-evaluation               End of Session Equipment Utilized During Treatment: Gait belt Activity Tolerance: Patient tolerated treatment well Patient left: in bed;with call bell/phone within reach;with family/visitor present Nurse Communication: Mobility status         Time: [id number] PT Time Calculation (min) (ACUTE ONLY): 15 min   Charges:   PT Evaluation $Initial PT Evaluation Tier I: 1 Procedure PT Treatments $Gait Training: 8-22 mins   PT G CodesYork Spaniel:          Hebert, Makhai Fulco SPT 01/03/2014, 3:00 PM  York SpanielOlivia Hebert, SPT  Acute Rehabilitation [phone]

## 2014-01-03 NOTE — Progress Notes (Signed)
Daily Rounding Note  01/03/2014, 10:38 AM  LOS: 3 days   SUBJECTIVE:       Confusing picture.  Normally  Has 3 loose, yellow stools daily.  After thanksgiving stools were dark and vomited the CG. Pt started on lactulose after c/o having urge to defecate, but only small amount of stool resulting.  Today and yeasterday stools once again yellow, loose, 3 per day.  Still feels like his belly is bubbling and uncomfortable.     OBJECTIVE:         Vital signs in last 24 hours:    Temp:  [98.2 F (36.8 C)-99 F (37.2 C)] 99 F (37.2 C) (11/30 16100633) Pulse Rate:  [78-89] 79 (11/30 0633) Resp:  [16-24] 16 (11/30 96040633) BP: (103-122)/(59-81) 108/59 mmHg (11/30 0633) SpO2:  [98 %-100 %] 98 % (11/30 0633) Last BM Date: 01/03/14 Filed Weights   12/31/13 0011 12/31/13 1249  Weight: 170 lb (77.111 kg) 162 lb 14.7 oz (73.9 kg)   General: tremulous and anxious.     Heart: RRR.  No MRG Chest: clear bil.  Abdomen: soft, NT,, ND.  BS active  Extremities: no CCE Neuro/Psych:  Pleasant, anxious, oriented x 3.    Intake/Output from previous day: 11/29 0701 - 11/30 0700 In: 1673.5 [P.O.:270; I.V.:1403.5] Out: 2245 [Urine:2245]  Intake/Output this shift: Total I/O In: 240 [P.O.:240] Out: -   Lab Results:  Recent Labs  01/01/14 1653 01/02/14 0232 01/03/14 0446  WBC 5.7 4.0 4.4  HGB 8.3* 7.7* 8.3*  HCT 23.8* 22.3* 23.7*  PLT 132* 112* 148*   BMET  Recent Labs  01/02/14 0232 01/03/14 0446  NA 142 141  K 3.7 3.8  CL 105 106  CO2 27 26  GLUCOSE 113* 108*  BUN 5* 5*  CREATININE 0.80 0.79  CALCIUM 8.2* 8.4   LFT  Recent Labs  01/02/14 0232 01/03/14 0446  PROT 5.3* 5.6*  ALBUMIN 2.8* 3.0*  AST 15 14  ALT 20 20  ALKPHOS 72 76  BILITOT 0.2* 0.2*   PT/INR No results for input(s): LABPROT, INR in the last 72 hours. Hepatitis Panel No results for input(s): HEPBSAG, HCVAB, HEPAIGM, HEPBIGM in the last 72  hours.  Studies/Results: No results found.  Scheduled Meds: . folic acid  1 mg Oral Daily  . multivitamin with minerals  1 tablet Oral Daily  . pantoprazole (PROTONIX) IV  40 mg Intravenous Q12H  . sodium chloride  3 mL Intravenous Q12H  . thiamine  100 mg Oral Daily   Continuous Infusions: . sodium chloride 30 mL/hr at 01/02/14 1518   PRN Meds:.albuterol, loperamide, morphine injection, ondansetron (ZOFRAN) IV, oxyCODONE, promethazine  ASSESMENT:   *  Left abdominal pain. Melena, hematemesis.  EGD 11/28: Grade C esophagitis, smalll sliding HH.  Moderate duodenitis.  Minor elevation of Lipase to 62, + fatty liver, esophageal wall thickening and diverticulosis but no pancreatitis on 12/29/13 CT scan.   *  ABL anemia. No transfusions to date.   *  ETOH abuse, intoxicated at admission.   *  Thrombocytopenia.  *  Fatty liver.    PLAN   *  Stop narcotics, stop lactulose, stop carafate, added tylenol.  *  Switch to oral PPI once daily.   *  Waiting on biopsies of stomach.  *  If still c/o GI upset, would stop all the oral  vitamin supplements.  *  Pt advised to stop drinking ETOH.  *  K  pad.  *  Follow up with PCP. GI signing off.    Jennye MoccasinSarah Gribbin  01/03/2014, 10:38 AM Pager: [phone]     Attending physician's note   I have taken an interval history, reviewed the chart and examined the patient. I agree with the Advanced Practitioner's note, impression and recommendations.   Venita LickMalcolm T. Russella DarStark, MD [facility]

## 2014-01-03 NOTE — Plan of Care (Signed)
Problem: Phase I Progression Outcomes Goal: Other Phase I Outcomes/Goals Outcome: Not Applicable Date Met:  85/50/15  Problem: Phase II Progression Outcomes Goal: Tolerating diet Outcome: Completed/Met Date Met:  01/03/14 Goal: Other Phase II Outcomes/Goals Outcome: Not Applicable Date Met:  86/82/57  Problem: Phase III Progression Outcomes Goal: Other Phase III Outcomes/Goals Outcome: Not Applicable Date Met:  49/35/52  Problem: Discharge Progression Outcomes Goal: Tolerating diet Outcome: Completed/Met Date Met:  01/03/14 Goal: Other Discharge Outcomes/Goals Outcome: Not Applicable Date Met:  17/47/15

## 2014-01-03 NOTE — Care Management Note (Signed)
    Page 1 of 1   01/04/2014     12:39:38 PM CARE MANAGEMENT NOTE 01/04/2014  Patient:  Bary RichardSANCHEZ,Strider K   Account Number:  [id number]  Date Initiated:  01/03/2014  Documentation initiated by:  Letha CapeAYLOR,Orlene Salmons  Subjective/Objective Assessment:   dx gib  admit-     Action/Plan:   Anticipated DC Date:  01/04/2014   Anticipated DC Plan:  HOME/SELF CARE      DC Planning Services  CM consult  Medication Assistance  Follow-up appt scheduled      Choice offered to / List presented to:             Status of service:  Completed, signed off Medicare Important Message given?  NO (If response is "NO", the following Medicare IM given date fields will be blank) Date Medicare IM given:   Medicare IM given by:   Date Additional Medicare IM given:   Additional Medicare IM given by:    Discharge Disposition:  HOME/SELF CARE  Per UR Regulation:  Reviewed for med. necessity/level of care/duration of stay  If discussed at Long Length of Stay Meetings, dates discussed:    Comments:  01/04/14 1237 Letha Capeeborah Ruhan Borak RNm BSN [phone] patient is for dc today, NCM gave patient information for [facility] for f/u apt and for financial ast.  Informed patient that he can get a sample from clinic today and he needs to sign up for the pass program for the inhaler and his protonix will be $4.  Patient has transportation.  01/03/14 1719 Letha Capeeborah Kendel Pesnell RN ,BSN [phone] patient with GIB, patient may need ast with meds and [facility]

## 2014-01-04 MED ORDER — ACETAMINOPHEN 325 MG PO TABS: 325.0000 mg | ORAL_TABLET | Freq: Four times a day (QID) | ORAL | Status: AC | PRN

## 2014-01-04 MED ORDER — PANTOPRAZOLE SODIUM 40 MG PO TBEC: 40.0000 mg | DELAYED_RELEASE_TABLET | Freq: Every day | ORAL | Status: AC

## 2014-01-04 NOTE — Plan of Care (Signed)
Problem: Phase I Progression Outcomes Goal: Pain controlled with appropriate interventions Outcome: Completed/Met Date Met:  01/04/14 Goal: Initial discharge plan identified Outcome: Completed/Met Date Met:  01/04/14 Goal: Hemodynamically stable Outcome: Completed/Met Date Met:  01/04/14  Problem: Phase II Progression Outcomes Goal: No active bleeding Outcome: Completed/Met Date Met:  01/04/14 Goal: Hemodynamically stable Outcome: Completed/Met Date Met:  01/04/14 Goal: H&H stablized < 1gm drop in 24 hrs Outcome: Completed/Met Date Met:  01/04/14 Goal: Progress activity as tolerated unless otherwise ordered Outcome: Completed/Met Date Met:  01/04/14  Problem: Phase III Progression Outcomes Goal: Activity at appropriate level-compared to baseline (UP IN CHAIR FOR HEMODIALYSIS)  Outcome: Completed/Met Date Met:  01/04/14 Goal: Discharge plan remains appropriate-arrangements made Outcome: Completed/Met Date Met:  01/04/14 Goal: H&H stablized <1gm drop in 24 hrs, no active bleeding Outcome: Completed/Met Date Met:  01/04/14  Problem: Discharge Progression Outcomes Goal: Barriers To Progression Addressed/Resolved Outcome: Not Applicable Date Met:  58/00/63 Goal: Discharge plan in place and appropriate Outcome: Completed/Met Date Met:  01/04/14 Goal: Pain controlled with appropriate interventions Outcome: Completed/Met Date Met:  01/04/14 Goal: Stools guaiac negative Outcome: Not Applicable Date Met:  49/49/44 Goal: Hemodynamically stable Outcome: Completed/Met Date Met:  01/04/14 Goal: Activity appropriate for discharge plan Outcome: Completed/Met Date Met:  01/04/14 Goal: [place] arrangements in place Outcome: Not Applicable Date Met:  73/95/84

## 2014-01-04 NOTE — Discharge Instructions (Signed)
You were cared for by a hospitalist during your hospital stay. If you have any questions about your discharge medications or the care you received while you were in the hospital after you are discharged, you can call the unit and asked to speak with the hospitalist on call if the hospitalist that took care of you is not available. Once you are discharged, your primary care physician will handle any further medical issues. Please note that NO REFILLS for any discharge medications will be authorized once you are discharged, as it is imperative that you return to your primary care physician (or establish a relationship with a primary care physician if you do not have one) for your aftercare needs so that they can reassess your need for medications and monitor your lab values.     If you do not have a primary care physician, you can call [phone] for a physician referral.  Follow with Primary MD in 1-2 WEEKS   Get CBC, CMP checked by your doctor and again as further instructed.  Get a 2 view Chest X ray done next visit if you had Pneumonia of Lung problems at the Hospital.  Get Medicines reviewed and adjusted.  Please request your Prim.MD to go over all Hospital Tests and Procedure/Radiological results at the follow up, please get all Hospital records sent to your Prim MD by signing hospital release before you go home.  Activity: As tolerated with Full fall precautions use walker/cane & assistance as needed  Diet: LOW FAT  For Heart failure patients - Check your Weight same time everyday, if you gain over 2 pounds, or you develop in leg swelling, experience more shortness of breath or chest pain, call your Primary MD immediately. Follow Cardiac Low Salt Diet and 1.8 lit/day fluid restriction.  Disposition Home  If you experience worsening of your admission symptoms, develop shortness of breath, life threatening emergency, suicidal or homicidal thoughts you must seek medical attention immediately by  calling 911 or calling your MD immediately  if symptoms less severe.  You Must read complete instructions/literature along with all the possible adverse reactions/side effects for all the Medicines you take and that have been prescribed to you. Take any new Medicines after you have completely understood and accpet all the possible adverse reactions/side effects.   Do not drive and provide baby sitting services if your were admitted for syncope or siezures until you have seen by Primary MD or a Neurologist and advised to do so again.  Do not drive when taking Pain medications.   Do not take more than prescribed Pain, Sleep and Anxiety Medications  Special Instructions: If you have smoked or chewed Tobacco  in the last 2 yrs please stop smoking, stop any regular Alcohol  and or any Recreational drug use.  Wear Seat belts while driving.

## 2014-01-04 NOTE — Progress Notes (Signed)
Patient was discharged home by MD order; discharged instructions review and give to patient with care notes and prescriptions; IV DIC; skin intact; patient will be escorted to the car by nurse tech via wheelchair.  

## 2014-01-04 NOTE — Discharge Summary (Signed)
Physician Discharge Summary  Jacob Gaines ZOX:[id number] DOB: 1965-07-21 DOA: 12/31/2013  PCP: No PCP Per Patient  Admit date: 12/31/2013 Discharge date: 01/04/2014  Time spent: 35 minutes  Recommendations for Outpatient Follow-up:  1. Follow up with PCP in 1-2 weeks  2. Follow up with GI as an outpatient  Recommendations for primary care physician for things to follow:  Repeat CBC and CMP  Discharge Diagnoses:  Principal Problem:   Melena Active Problems:   Alcohol abuse   GI bleed   Hyperglycemia   Gastrointestinal hemorrhage with melena   Abdominal pain, chronic, epigastric   Discharge Condition: stable  Diet recommendation: regular   Filed Weights   12/31/13 0011 12/31/13 1249  Weight: 77.111 kg (170 lb) 73.9 kg (162 lb 14.7 oz)    History of present illness:  Jacob Gaines is a 48 y.o. male h/o EtOH abuse who presents to the ED with c/o abdominal pain onset yesterday with acute worsening tonight. Patient reports he laid down after eating tonight and woke up with sharp burning pain radiating from his left side, he stood up and felt dizzy. He went outside and threw up, vomit appears red though he did eat red items at dinner tonight (cranberry sauce and red gatorade). He then went to bathroom and and had melanotic stool. Last BM was yesterday and was "normal". He called 911 and was brought to ED.  Hospital Course:  UGIB (hematemesis and melena) - esophagitis, gastritis, duodenitis - GI was consulted and patient underwent an EGD on 11/28 which noted LA Grade C esophagitis in the setting of a small sliding hiatal hernia, mild gastritis, and moderate duodenitis with subsequent plans for PPI and repeat EGD in 8 weeks to check for Barrett's  - lipase normal  - GI recommends stopping all laxative and narcotics  -Diet advanced to solids on 11/30, patient tolerating well without nausea/vomiting or abdominal pain, discharged home on 12/1 in stable condition.    Acute blood loss w/o anemia  Hgb 11.3 at presentation - nadir thus far 7.7 and has stabilized without clinical evidence of ongoing bleeding - PCP follow up in 1-2 weeks and will need a repeat CBC  Modest hypotension Improved with hydration  Hyperglycemia A1c 6.0 / not c/w DM yet - will need intermittent outpt monitoring   EtOH abuse Counseled on absolute need to stop drinking - no evidence of withdrawal thus far - LFTs normal   Tobacco abuse counseled on need to abstain  Sleep apnea Noncompliant w/ CPAP    Procedures:  EGD 11/28 ENDOSCOPIC IMPRESSION: 1) LA Grade C esophagitis in the setting of a small sliding hiatal hernia. 2) Mild gastritis. 3) Moderate duodenitis.  RECOMMENDATIONS: 1) PPI QD. 2) Consider a repeat EGD in 8 weeks to check for underlying evidence of Barrett's esophagus.  Consultations:  Gastroenterology   Discharge Exam: Filed Vitals:   01/03/14 8119 01/03/14 1359 01/03/14 2157 01/04/14 0606  BP: 108/59 136/82 108/61 104/64  Pulse: 79  85 69  Temp: 99 F (37.2 C) 98.3 F (36.8 C) 98.5 F (36.9 C) 98 F (36.7 C)  TempSrc: Oral Oral Oral Oral  Resp: 16 18 18 16   Height:      Weight:      SpO2: 98% 98% 100% 100%    General: NAD Cardiovascular: RRR Respiratory: CTA biL  Discharge Instructions     Medication List    STOP taking these medications        oxyCODONE-acetaminophen 5-325 MG per tablet  Commonly known as:  PERCOCET/ROXICET      TAKE these medications        acetaminophen 325 MG tablet  Commonly known as:  TYLENOL  Take 1 tablet (325 mg total) by mouth every 6 (six) hours as needed for mild pain, fever or headache.     albuterol 108 (90 BASE) MCG/ACT inhaler  Commonly known as:  PROVENTIL HFA;VENTOLIN HFA  Inhale 2 puffs into the lungs every 6 (six) hours as needed for wheezing or shortness of breath.     pantoprazole 40 MG tablet  Commonly known as:  PROTONIX  Take 1 tablet (40 mg total) by mouth daily at 6  (six) AM.       Follow-up Information    Follow up with [facility]    . Schedule an appointment as soon as possible for a visit on 01/05/2014.   Why:  9 am for hospital follow up on 12/2   Contact information:   [address]  [place] [phone]      Follow up with Judie PetitMalcolm T. Russella DarStark, MD. Schedule an appointment as soon as possible for a visit in 4 weeks.   Specialty:  Gastroenterology   Contact information:   [address] [place] [id number] [phone]       The results of significant diagnostics from this hospitalization (including imaging, microbiology, ancillary and laboratory) are listed below for reference.    Significant Diagnostic Studies: Dg Chest 2 View  12/31/2013   CLINICAL DATA:  Chest pain and asthma  EXAM: CHEST  2 VIEW  COMPARISON:  12/29/2013  FINDINGS: The heart size and mediastinal contours are within normal limits. Both lungs are clear. The visualized skeletal structures are unremarkable.  IMPRESSION: No active cardiopulmonary disease.   Electronically Signed   By: Signa Kellaylor  Stroud M.D.   On: 12/31/2013 01:49   Dg Chest 2 View  12/29/2013   CLINICAL DATA:  Left side abdominal pain.  Nausea and vomiting.  EXAM: CHEST  2 VIEW  COMPARISON:  CT chest 11/15/2012.  PA and lateral chest 04/01/2013.  FINDINGS: The lungs are clear. Heart size is upper normal. No pneumothorax or pleural effusion.  IMPRESSION: No acute disease.   Electronically Signed   By: Drusilla Kannerhomas  Dalessio M.D.   On: 12/29/2013 17:34   Ct Head Wo Contrast  12/31/2013   CLINICAL DATA:  Black bowel with fall and head trauma. Headache, facial pain, neck pain.  EXAM: CT HEAD WITHOUT CONTRAST  CT MAXILLOFACIAL WITHOUT CONTRAST  CT CERVICAL SPINE WITHOUT CONTRAST  TECHNIQUE: Multidetector CT imaging of the head, cervical spine, and maxillofacial structures were performed using the standard protocol without intravenous contrast. Multiplanar CT  image reconstructions of the cervical spine and maxillofacial structures were also generated.  COMPARISON:  12/29/2013 head CT.  09/18/2013 face CT  FINDINGS: CT HEAD FINDINGS  Skull and Sinuses:No calvarial fracture. Facial findings are described below.  Orbits: No acute abnormality.  Brain: No evidence of acute abnormality, such as acute infarction, hemorrhage, hydrocephalus, or mass lesion/mass effect.  CT MAXILLOFACIAL FINDINGS  No acute facial fracture. The mandible is located. Healed appearance of medial and inferior right orbital blowout fractures, acute on imaging 09/18/2013. Right intraconal hemorrhage and edema has resolved. Currently there is no evidence of globe injury or postseptal hematoma. Dysconjugate gaze, usually incidental. The right eye is deviated temporally, not expected if there was a medial rectus entrapment. Chronic deformity of the nasal arch and anterior nasal  septum.  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 There is frothy secretions present within the left maxillary sinus, but a maxillary antrostomy or secondary ostium is widely patent. 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  CT CERVICAL SPINE FINDINGS  Negative for acute fracture or subluxation. No prevertebral edema. No gross cervical canal hematoma. Spondylotic changes in the mid and lower cervical spine. No significant osseous canal or foraminal stenosis.  IMPRESSION: 1. No evidence of intracranial, facial or cervical spine acute injury. 2. Stable appearance of remote right orbital blowout fractures. 3. Chronic sinusitis.   Electronically Signed   By: Tiburcio PeaJonathan  Watts M.D.   On: 12/31/2013 02:18   Ct Head Wo Contrast  12/29/2013   CLINICAL DATA:  Altered mental status.  EXAM: CT HEAD WITHOUT CONTRAST  TECHNIQUE: Contiguous axial images were obtained from the base of the skull through the vertex without intravenous contrast.  COMPARISON:  Head CT scan 09/23/2013 and  01/22/2013.  FINDINGS: The brain appears normal without hemorrhage, infarct, mass lesion, mass effect, midline shift or abnormal extra-axial fluid collection. There is marked mucosal thickening in both maxillary sinuses, worse on the left, with sclerosis in wall thickening seen bilaterally consistent with chronic change. Scattered ethmoid air cell disease is identified. The patient has a remote fracture of the medial wall of the right orbit.  IMPRESSION: No acute intracranial abnormality.  Chronic maxillary sinus disease, worse on the left.   Electronically Signed   By: Drusilla Kannerhomas  Dalessio M.D.   On: 12/29/2013 17:59   Ct Cervical Spine Wo Contrast  12/31/2013   CLINICAL DATA:  Black bowel with fall and head trauma. Headache, facial pain, neck pain.  EXAM: CT HEAD WITHOUT CONTRAST  CT MAXILLOFACIAL WITHOUT CONTRAST  CT CERVICAL SPINE WITHOUT CONTRAST  TECHNIQUE: Multidetector CT imaging of the head, cervical spine, and maxillofacial structures were performed using the standard protocol without intravenous contrast. Multiplanar CT image reconstructions of the cervical spine and maxillofacial structures were also generated.  COMPARISON:  12/29/2013 head CT.  09/18/2013 face CT  FINDINGS: CT HEAD FINDINGS  Skull and Sinuses:No calvarial fracture. Facial findings are described below.  Orbits: No acute abnormality.  Brain: No evidence of acute abnormality, such as acute infarction, hemorrhage, hydrocephalus, or mass lesion/mass effect.  CT MAXILLOFACIAL FINDINGS  No acute facial fracture. The mandible is located. Healed appearance of medial and inferior right orbital blowout fractures, acute on imaging 09/18/2013. Right intraconal hemorrhage and edema has resolved. Currently there is no evidence of globe injury or postseptal hematoma. Dysconjugate gaze, usually incidental. The right eye is deviated temporally, not expected if there was a medial rectus entrapment. Chronic deformity of the nasal arch and anterior nasal  septum.  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 There is frothy secretions present within the left maxillary sinus, but a maxillary antrostomy or secondary ostium is widely patent. 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  CT CERVICAL SPINE FINDINGS  Negative for acute fracture or subluxation. No prevertebral edema. No gross cervical canal hematoma. Spondylotic changes in the mid and lower cervical spine. No significant osseous canal or foraminal stenosis.  IMPRESSION: 1. No evidence of intracranial, facial or cervical spine acute injury. 2. Stable appearance of remote right orbital blowout fractures. 3. Chronic sinusitis.   Electronically Signed   By: Tiburcio PeaJonathan  Watts M.D.   On: 12/31/2013 02:18   Ct Abdomen Pelvis W Contrast  12/29/2013   CLINICAL DATA:  Left upper quadrant pain. Left flank pain. Altered mental status.  EXAM: CT ABDOMEN AND PELVIS WITH CONTRAST  TECHNIQUE: Multidetector CT imaging of the abdomen and pelvis was performed using the standard protocol following bolus administration of intravenous contrast.  CONTRAST:  OMNIPAQUE IOHEXOL 300 MG/ML  SOLN  COMPARISON:  07/31/2013  FINDINGS: Lower Chest:  Unremarkable.  Hepatobiliary: No masses identified. Mild steatosis noted. Surgical clips seen from prior cholecystectomy.  Pancreas: No mass, inflammatory changes, or other parenchymal abnormality identified.  Spleen:  Within normal limits in size and appearance.  Adrenal Glands:  No mass identified.  Kidneys/Urinary Tract: No masses identified. Mild right hydronephrosis and ureterectasis noted. No obstructing calculus visualized. Distended urinary bladder again noted, without evidence of focal wall thickening or mass.  Stomach/Bowel/Peritoneum: Descending colon diverticulosis again noted. No evidence of diverticulitis. Distal esophageal wall thickening also seen, suspicious for esophagitis.  Vascular/Lymphatic: No pathologically  enlarged lymph nodes identified. No other significant abnormality identified.  Reproductive:  Mildly enlarged prostate.  Other:  None.  Musculoskeletal:  No suspicious bone lesions identified.  IMPRESSION: Mildly enlarged prostate and distended urinary bladder. Suggest clinical correlation for urinary retention.  Mild right hydronephrosis and ureterectasis. No obstructing calculus visualized. This may be due to vesicoureteral reflux.  Mild distal esophageal wall thickening, suspicious for esophagitis. Consider endoscopy for further evaluation.  Diverticulosis. No radiographic evidence of diverticulitis.  Mild hepatic steatosis.   Electronically Signed   By: Myles Rosenthal M.D.   On: 12/29/2013 18:12   Ct Maxillofacial Wo Cm  12/31/2013   CLINICAL DATA:  Black bowel with fall and head trauma. Headache, facial pain, neck pain.  EXAM: CT HEAD WITHOUT CONTRAST  CT MAXILLOFACIAL WITHOUT CONTRAST  CT CERVICAL SPINE WITHOUT CONTRAST  TECHNIQUE: Multidetector CT imaging of the head, cervical spine, and maxillofacial structures were performed using the standard protocol without intravenous contrast. Multiplanar CT image reconstructions of the cervical spine and maxillofacial structures were also generated.  COMPARISON:  12/29/2013 head CT.  09/18/2013 face CT  FINDINGS: CT HEAD FINDINGS  Skull and Sinuses:No calvarial fracture. Facial findings are described below.  Orbits: No acute abnormality.  Brain: No evidence of acute abnormality, such as acute infarction, hemorrhage, hydrocephalus, or mass lesion/mass effect.  CT MAXILLOFACIAL FINDINGS  No acute facial fracture. The mandible is located. Healed appearance of medial and inferior right orbital blowout fractures, acute on imaging 09/18/2013. Right intraconal hemorrhage and edema has resolved. Currently there is no evidence of globe injury or postseptal hematoma. Dysconjugate gaze, usually incidental. The right eye is deviated temporally, not expected if there was a medial  rectus entrapment. Chronic deformity of the nasal arch and anterior nasal septum.  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 There is frothy secretions present within the left maxillary sinus, but a maxillary antrostomy or secondary ostium is widely patent. 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  CT CERVICAL SPINE FINDINGS  Negative for acute fracture or subluxation. No prevertebral edema. No gross cervical canal hematoma. Spondylotic changes in the mid and lower cervical spine. No significant osseous canal or foraminal stenosis.  IMPRESSION: 1. No evidence of intracranial, facial or cervical spine acute injury. 2. Stable appearance of remote right orbital blowout fractures. 3. Chronic sinusitis.   Electronically Signed   By: Tiburcio Pea M.D.   On: 12/31/2013 02:18   Labs: Basic Metabolic Panel:  Recent Labs Lab 12/29/13 1953 12/31/13 0100 12/31/13 0450 01/02/14 0232 01/03/14 0446  NA 143 140 145  142 141  K 4.0 4.5 4.5 3.7 3.8  CL 112 105 112 105 106  CO2 19 20 23 27 26   GLUCOSE 102* 236* 122* 113* 108*  BUN 7 39* 29* 5* 5*  CREATININE 0.70 0.79 0.68 0.80 0.79  CALCIUM 6.8* 8.4 7.2* 8.2* 8.4   Liver Function Tests:  Recent Labs Lab 12/29/13 1441 12/31/13 0100 01/02/14 0232 01/03/14 0446  AST 22 14 15 14   ALT 32 22 20 20   ALKPHOS 122* 85 72 76  BILITOT <0.2* 0.7 0.2* 0.2*  PROT 6.8 6.0 5.3* 5.6*  ALBUMIN 4.0 3.2* 2.8* 3.0*    Recent Labs Lab 12/29/13 1441 12/31/13 0100 01/01/14 0010  LIPASE 62* 27 22   CBC:  Recent Labs Lab 12/29/13 1441 12/31/13 0100  12/31/13 1329 01/01/14 0010 01/01/14 1653 01/02/14 0232 01/03/14 0446  WBC 5.5 11.2*  < > 5.3 4.1 5.7 4.0 4.4  NEUTROABS 3.1 9.5*  --   --   --   --   --   --   HGB 14.5 11.3*  < > 8.9* 8.1* 8.3* 7.7* 8.3*  HCT 41.7 32.4*  < > 25.5* 23.2* 23.8* 22.3* 23.7*  MCV 89.1 91.0  < > 91.4 91.3 88.1 89.9 90.5  PLT 169 173  < > 125* 120* 132* 112* 148*    < > = values in this interval not displayed. Cardiac Enzymes:  Recent Labs Lab 12/29/13 1938 12/31/13 0100  CKTOTAL 200  --   TROPONINI <0.30 <0.30   CBG:  Recent Labs Lab 12/31/13 1820 12/31/13 1914 12/31/13 2331 01/01/14 0325 01/01/14 1220  GLUCAP 164* 182* 100* 92 154*    Signed:  Storie Heffern  [facility] 01/04/2014, 2:28 PM

## 2014-01-04 NOTE — Plan of Care (Signed)
Problem: Consults Goal: GI Bleeding Patient Education See Patient Education Module for education specifics.  Outcome: Completed/Met Date Met:  01/04/14 Goal: Skin Care Protocol Initiated - if Braden Score 18 or less If consults are not indicated, leave blank or document N/A  Outcome: Not Applicable Date Met:  93/71/69 Goal: Nutrition Consult-if indicated Outcome: Not Applicable Date Met:  67/89/38 Goal: Diabetes Guidelines if Diabetic/Glucose > 140 If diabetic or lab glucose is > 140 mg/dl - Initiate Diabetes/Hyperglycemia Guidelines & Document Interventions  Outcome: Not Applicable Date Met:  11/20/49

## 2014-01-06 ENCOUNTER — Ambulatory Visit: Payer: Self-pay | Attending: Internal Medicine | Admitting: Internal Medicine

## 2014-01-06 ENCOUNTER — Encounter: Payer: Self-pay | Admitting: Internal Medicine

## 2014-01-06 VITALS — BP 126/75 | HR 73 | Temp 98.6°F | Resp 16 | Wt 165.0 lb

## 2014-01-06 DIAGNOSIS — K921 Melena: Secondary | ICD-10-CM | POA: Insufficient documentation

## 2014-01-06 DIAGNOSIS — K219 Gastro-esophageal reflux disease without esophagitis: Secondary | ICD-10-CM | POA: Insufficient documentation

## 2014-01-06 DIAGNOSIS — R7303 Prediabetes: Secondary | ICD-10-CM | POA: Insufficient documentation

## 2014-01-06 DIAGNOSIS — J45909 Unspecified asthma, uncomplicated: Secondary | ICD-10-CM | POA: Insufficient documentation

## 2014-01-06 DIAGNOSIS — Z79899 Other long term (current) drug therapy: Secondary | ICD-10-CM | POA: Insufficient documentation

## 2014-01-06 DIAGNOSIS — F1010 Alcohol abuse, uncomplicated: Secondary | ICD-10-CM | POA: Insufficient documentation

## 2014-01-06 DIAGNOSIS — R7309 Other abnormal glucose: Secondary | ICD-10-CM | POA: Insufficient documentation

## 2014-01-06 DIAGNOSIS — F17210 Nicotine dependence, cigarettes, uncomplicated: Secondary | ICD-10-CM | POA: Insufficient documentation

## 2014-01-06 DIAGNOSIS — Z720 Tobacco use: Secondary | ICD-10-CM

## 2014-01-06 DIAGNOSIS — H04129 Dry eye syndrome of unspecified lacrimal gland: Secondary | ICD-10-CM | POA: Insufficient documentation

## 2014-01-06 DIAGNOSIS — J4520 Mild intermittent asthma, uncomplicated: Secondary | ICD-10-CM

## 2014-01-06 DIAGNOSIS — Z139 Encounter for screening, unspecified: Secondary | ICD-10-CM

## 2014-01-06 DIAGNOSIS — Z0000 Encounter for general adult medical examination without abnormal findings: Secondary | ICD-10-CM

## 2014-01-06 DIAGNOSIS — K209 Esophagitis, unspecified without bleeding: Secondary | ICD-10-CM | POA: Insufficient documentation

## 2014-01-06 DIAGNOSIS — H538 Other visual disturbances: Secondary | ICD-10-CM | POA: Insufficient documentation

## 2014-01-06 LAB — CBC WITH DIFFERENTIAL/PLATELET
BASOS ABS: 0 10*3/uL (ref 0.0–0.1)
BASOS PCT: 0 % (ref 0–1)
Eosinophils Absolute: 0.1 10*3/uL (ref 0.0–0.7)
Eosinophils Relative: 2 % (ref 0–5)
HEMATOCRIT: 28.2 % — AB (ref 39.0–52.0)
Hemoglobin: 9.7 g/dL — ABNORMAL LOW (ref 13.0–17.0)
Lymphocytes Relative: 33 % (ref 12–46)
Lymphs Abs: 1.7 10*3/uL (ref 0.7–4.0)
MCH: 30.3 pg (ref 26.0–34.0)
MCHC: 34.4 g/dL (ref 30.0–36.0)
MCV: 88.1 fL (ref 78.0–100.0)
MPV: 9.7 fL (ref 9.4–12.4)
Monocytes Absolute: 0.6 10*3/uL (ref 0.1–1.0)
Monocytes Relative: 11 % (ref 3–12)
NEUTROS ABS: 2.9 10*3/uL (ref 1.7–7.7)
NEUTROS PCT: 54 % (ref 43–77)
PLATELETS: 292 10*3/uL (ref 150–400)
RBC: 3.2 MIL/uL — ABNORMAL LOW (ref 4.22–5.81)
RDW: 14.3 % (ref 11.5–15.5)
WBC: 5.3 10*3/uL (ref 4.0–10.5)

## 2014-01-06 LAB — POCT GLYCOSYLATED HEMOGLOBIN (HGB A1C): Hemoglobin A1C: 5.7

## 2014-01-06 LAB — GLUCOSE, POCT (MANUAL RESULT ENTRY): POC Glucose: 102 mg/dl — AB (ref 70–99)

## 2014-01-06 LAB — TSH: TSH: 2.551 u[IU]/mL (ref 0.350–4.500)

## 2014-01-06 MED ORDER — POLYVINYL ALCOHOL 1.4 % OP SOLN: 1.0000 [drp] | OPHTHALMIC | Status: AC | PRN

## 2014-01-06 MED ORDER — ALBUTEROL SULFATE HFA 108 (90 BASE) MCG/ACT IN AERS: 2.0000 | INHALATION_SPRAY | Freq: Four times a day (QID) | RESPIRATORY_TRACT | Status: AC | PRN

## 2014-01-06 NOTE — Progress Notes (Signed)
Patient here for hospital follow up Was admitted for four days for abd pain and pancreatitis caused by alcohol consumption Has not yet started his discharge medications

## 2014-01-06 NOTE — Patient Instructions (Signed)
Diabetes Mellitus and Food It is important for you to manage your blood sugar (glucose) level. Your blood glucose level can be greatly affected by what you eat. Eating healthier foods in the appropriate amounts throughout the day at about the same time each day will help you control your blood glucose level. It can also help slow or prevent worsening of your diabetes mellitus. Healthy eating may even help you improve the level of your blood pressure and reach or maintain a healthy weight.  HOW CAN FOOD AFFECT ME? Carbohydrates Carbohydrates affect your blood glucose level more than any other type of food. Your dietitian will help you determine how many carbohydrates to eat at each meal and teach you how to count carbohydrates. Counting carbohydrates is important to keep your blood glucose at a healthy level, especially if you are using insulin or taking certain medicines for diabetes mellitus. Alcohol Alcohol can cause sudden decreases in blood glucose (hypoglycemia), especially if you use insulin or take certain medicines for diabetes mellitus. Hypoglycemia can be a life-threatening condition. Symptoms of hypoglycemia (sleepiness, dizziness, and disorientation) are similar to symptoms of having too much alcohol.  If your health care provider has given you approval to drink alcohol, do so in moderation and use the following guidelines:  Women should not have more than one drink per day, and men should not have more than two drinks per day. One drink is equal to:  12 oz of beer.  5 oz of wine.  1 oz of hard liquor.  Do not drink on an empty stomach.  Keep yourself hydrated. Have water, diet soda, or unsweetened iced tea.  Regular soda, juice, and other mixers might contain a lot of carbohydrates and should be counted. WHAT FOODS ARE NOT RECOMMENDED? As you make food choices, it is important to remember that all foods are not the same. Some foods have fewer nutrients per serving than other  foods, even though they might have the same number of calories or carbohydrates. It is difficult to get your body what it needs when you eat foods with fewer nutrients. Examples of foods that you should avoid that are high in calories and carbohydrates but low in nutrients include:  Trans fats (most processed foods list trans fats on the Nutrition Facts label).  Regular soda.  Juice.  Candy.  Sweets, such as cake, pie, doughnuts, and cookies.  Fried foods. WHAT FOODS CAN I EAT? Have nutrient-rich foods, which will nourish your body and keep you healthy. The food you should eat also will depend on several factors, including:  The calories you need.  The medicines you take.  Your weight.  Your blood glucose level.  Your blood pressure level.  Your cholesterol level. You also should eat a variety of foods, including:  Protein, such as meat, poultry, fish, tofu, nuts, and seeds (lean animal proteins are best).  Fruits.  Vegetables.  Dairy products, such as milk, cheese, and yogurt (low fat is best).  Breads, grains, pasta, cereal, rice, and beans.  Fats such as olive oil, trans fat-free margarine, canola oil, avocado, and olives. DOES EVERYONE WITH DIABETES MELLITUS HAVE THE SAME MEAL PLAN? Because every person with diabetes mellitus is different, there is not one meal plan that works for everyone. It is very important that you meet with a dietitian who will help you create a meal plan that is just right for you. Document Released: 10/18/2004 Document Revised: 01/26/2013 Document Reviewed: 12/18/2012 [employer] Patient Information 2015 [employer]. This   information is not intended to replace advice given to you by your health care provider. Make sure you discuss any questions you have with your health care provider.  

## 2014-01-06 NOTE — Progress Notes (Signed)
Patient Demographics  Jacob SchullerDonald Gaines, is a 48 y.o. male  BMW:[id number]  UVO:[id number]  DOB - Sep 28, 1965  CC:  Chief Complaint  Patient presents with  . Hospitalization Follow-up       HPI: Jacob SchullerDonald Gaines is a 48 y.o. male here today to establish medical care.History of alcohol abuse recently hospitalized with abdominal pain nausea hematemesis, melanotic stool, EMR reviewed patient had EGD done found to have esophagitis gastritis, duodenitis, is hemoglobin dropped from 11 to 8 patient currently denies any more bleeding, he was advised to have repeat CBC done as well as have her follow up with the GI for repeat EGD in 8 weeks to check for Barrett's esophagus. I have advised patient to avoid alcohol and quit smoking, as per patient he has not started taking PPI yet. Patient does complain of blurry vision was wearing her corrective glasses in the past, also had a surgery done in his right eye, complaining of dry eyes and is requesting eyedrops, patient also history of asthma and is requesting refill on albuterol. Patient also has impaired fasting glucose.Patient has No headache, No chest pain, No abdominal pain - No Nausea, No new weakness tingling or numbness, No Cough - SOB.  No Known Allergies Past Medical History  Diagnosis Date  . ETOH abuse   . Tobacco abuse   . Enlarged heart     "when I was little" (09/18/2012)  . Chest pain     "only related to asthma attack" (09/18/2012)  . Shortness of breath     " related to asthma attack; sometimes in the mornings too" (09/18/2012)  . Pneumonia   . Anxiety   . Sleep apnea     "I don't wear a mask" (09/18/2012)  . Asthma   . GERD (gastroesophageal reflux disease)   . Headache(784.0)     "only related to asthma attack" (09/18/2012)   Current Outpatient Prescriptions on File Prior to Visit  Medication Sig Dispense Refill  . acetaminophen (TYLENOL) 325 MG tablet Take 1 tablet (325 mg total) by mouth every 6 (six) hours as needed for  mild pain, fever or headache. 30 tablet 0  . pantoprazole (PROTONIX) 40 MG tablet Take 1 tablet (40 mg total) by mouth daily at 6 (six) AM. 30 tablet 2   No current facility-administered medications on file prior to visit.   Family History  Problem Relation Age of Onset  . Heart attack Paternal Grandfather [id number]  . Colon cancer Paternal Grandmother   . Stroke Sister    History   Social History  . Marital Status: Single    Spouse Name: N/A    Number of Children: N/A  . Years of Education: N/A   Occupational History  . Not on file.   Social History Main Topics  . Smoking status: Current Some Day Smoker -- 0.25 packs/day for .3 years    Types: Cigarettes  . Smokeless tobacco: Never Used  . Alcohol Use: 0.0 oz/week    0 Not specified per week     Comment: 3-4 cans of beer on the weekends  . Drug Use: No  . Sexual Activity: No   Other Topics Concern  . 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  Lives with a friend.  Does not have a PCP.    Review of Systems: Constitutional: Negative for fever, chills, diaphoresis, activity change, appetite change and fatigue. HENT: Negative for ear pain, nosebleeds, congestion, facial swelling,  rhinorrhea, neck pain, neck stiffness and ear discharge.  Eyes: Negative for pain, discharge, redness, itching and visual disturbance. Respiratory: Negative for cough, choking, chest tightness, shortness of breath, wheezing and stridor.  Cardiovascular: Negative for chest pain, palpitations and leg swelling. Gastrointestinal: Negative for abdominal distention. Genitourinary: Negative for dysuria, urgency, frequency, hematuria, flank pain, decreased urine volume, difficulty urinating and dyspareunia.  Musculoskeletal: Negative for back pain, joint swelling, arthralgia and gait problem. Neurological: Negative for dizziness, tremors, seizures, syncope, facial asymmetry, speech difficulty, weakness, light-headedness,  numbness and headaches.  Hematological: Negative for adenopathy. Does not bruise/bleed easily. Psychiatric/Behavioral: Negative for hallucinations, behavioral problems, confusion, dysphoric mood, decreased concentration and agitation.    Objective:   Filed Vitals:   01/06/14 0943  BP: 126/75  Pulse: 73  Temp: 98.6 F (37 C)  Resp: 16    Physical Exam: Constitutional: Patient appears well-developed and well-nourished. No distress. HENT: Normocephalic, atraumatic, External right and left ear normal. Oropharynx is clear and moist.  Eyes: Conjunctivae and EOM are normal. Right eye surgical pupil, no scleral icterus. Neck: Normal ROM. Neck supple. No JVD. No tracheal deviation. No thyromegaly. CVS: RRR, S1/S2 +, no murmurs, no gallops, no carotid bruit.  Pulmonary: Effort and breath sounds normal, no stridor, rhonchi, wheezes, rales.  Abdominal: Soft. BS +, no distension, tenderness, rebound or guarding.  Musculoskeletal: Normal range of motion. No edema and no tenderness.  Neuro: Alert. Normal reflexes, muscle tone coordination. No cranial nerve deficit. Skin: Skin is warm and dry. No rash noted. Not diaphoretic. No erythema. No pallor. Psychiatric: Normal mood and affect. Behavior, judgment, thought content normal.  Lab Results  Component Value Date   WBC 4.4 01/03/2014   HGB 8.3* 01/03/2014   HCT 23.7* 01/03/2014   MCV 90.5 01/03/2014   PLT 148* 01/03/2014   Lab Results  Component Value Date   CREATININE 0.79 01/03/2014   BUN 5* 01/03/2014   NA 141 01/03/2014   K 3.8 01/03/2014   CL 106 01/03/2014   CO2 26 01/03/2014    Lab Results  Component Value Date   HGBA1C 5.7 01/06/2014   Lipid Panel  No results found for: CHOL, TRIG, HDL, CHOLHDL, VLDL, LDLCALC     Assessment and plan:   1. Preventative health care Results for orders placed or performed in visit on 01/06/14  Glucose (CBG)  Result Value Ref Range   POC Glucose 102 (A) 70 - 99 mg/dl  HgB [id number]  Result  Value Ref Range   Hemoglobin A1C 5.7    Advised patient for low carbohydrate diet.  2. Gastrointestinal hemorrhage with melena Will repeat CBC. - CBC with Differential - Ambulatory referral to Gastroenterology  3. Tobacco abuse Advised patient to quit smoking.  4. Prediabetes Hemoglobin A1c is improved her, advised for low carbohydrate diet.  5. Alcohol abuse Advised patient to cut down and avoid alcohol.  6. Esophagitis Patient will start his  Protonix.  7. Blurry vision  - Ambulatory referral to Ophthalmology  8. Asthma, intermittent, uncomplicated  - albuterol (PROVENTIL HFA;VENTOLIN HFA) 108 (90 BASE) MCG/ACT inhaler; Inhale 2 puffs into the lungs every 6 (six) hours as needed for wheezing or shortness of breath.  Dispense: 18 g; Refill: 2  9. Dry eyes, unspecified laterality  - polyvinyl alcohol (ARTIFICIAL TEARS) 1.4 % ophthalmic solution; Place 1 drop into both eyes as needed for dry eyes.  Dispense: 15 mL; Refill: 0  10. Screening  - Vit D  25 hydroxy (rtn osteoporosis monitoring) - TSH  Health Maintenance  -Vaccinations:  Patient is uptodate with flu shot   Return in about 3 months (around 04/07/2014).    Doris Cheadle, MD

## 2014-01-07 ENCOUNTER — Telehealth: Payer: Self-pay

## 2014-01-07 LAB — VITAMIN D 25 HYDROXY (VIT D DEFICIENCY, FRACTURES): VIT D 25 HYDROXY: 19 ng/mL — AB (ref 30–100)

## 2014-01-07 MED ORDER — VITAMIN D (ERGOCALCIFEROL) 1.25 MG (50000 UNIT) PO CAPS: 50000.0000 [IU] | ORAL_CAPSULE | ORAL | Status: AC

## 2014-01-07 NOTE — Telephone Encounter (Signed)
-----   Message from Doris Cheadleeepak Advani, MD sent at 01/07/2014 12:45 PM EST ----- Call and let the patient know that his hemoglobin is stable and is improving, continue with taking Protonix and follow with GI. Also  noticed low vitamin D, call patient advise to start ergocalciferol 50,000 units once a week for the duration of  12 weeks.

## 2014-01-07 NOTE — Telephone Encounter (Signed)
Patient not available Left message on voice mail to return our call 

## 2014-02-20 ENCOUNTER — Emergency Department (HOSPITAL_COMMUNITY): Payer: Self-pay

## 2014-02-20 ENCOUNTER — Encounter (HOSPITAL_COMMUNITY): Payer: Self-pay | Admitting: *Deleted

## 2014-02-20 ENCOUNTER — Emergency Department (HOSPITAL_COMMUNITY)
Admission: EM | Admit: 2014-02-20 | Discharge: 2014-02-20 | Disposition: A | Discharge status: Home / Self Care | Payer: Self-pay | Attending: Emergency Medicine | Admitting: Emergency Medicine

## 2014-02-20 DIAGNOSIS — M545 Low back pain, unspecified: Secondary | ICD-10-CM

## 2014-02-20 DIAGNOSIS — Y998 Other external cause status: Secondary | ICD-10-CM | POA: Insufficient documentation

## 2014-02-20 DIAGNOSIS — Z8669 Personal history of other diseases of the nervous system and sense organs: Secondary | ICD-10-CM | POA: Insufficient documentation

## 2014-02-20 DIAGNOSIS — Z720 Tobacco use: Secondary | ICD-10-CM | POA: Insufficient documentation

## 2014-02-20 DIAGNOSIS — Y9389 Activity, other specified: Secondary | ICD-10-CM | POA: Insufficient documentation

## 2014-02-20 DIAGNOSIS — J45909 Unspecified asthma, uncomplicated: Secondary | ICD-10-CM | POA: Insufficient documentation

## 2014-02-20 DIAGNOSIS — K219 Gastro-esophageal reflux disease without esophagitis: Secondary | ICD-10-CM | POA: Insufficient documentation

## 2014-02-20 DIAGNOSIS — Y9289 Other specified places as the place of occurrence of the external cause: Secondary | ICD-10-CM | POA: Insufficient documentation

## 2014-02-20 DIAGNOSIS — Z8701 Personal history of pneumonia (recurrent): Secondary | ICD-10-CM | POA: Insufficient documentation

## 2014-02-20 DIAGNOSIS — W19XXXA Unspecified fall, initial encounter: Secondary | ICD-10-CM | POA: Insufficient documentation

## 2014-02-20 DIAGNOSIS — Z79899 Other long term (current) drug therapy: Secondary | ICD-10-CM | POA: Insufficient documentation

## 2014-02-20 DIAGNOSIS — M549 Dorsalgia, unspecified: Secondary | ICD-10-CM

## 2014-02-20 DIAGNOSIS — F1010 Alcohol abuse, uncomplicated: Secondary | ICD-10-CM | POA: Insufficient documentation

## 2014-02-20 DIAGNOSIS — S3992XA Unspecified injury of lower back, initial encounter: Secondary | ICD-10-CM | POA: Insufficient documentation

## 2014-02-20 NOTE — ED Notes (Signed)
The pt arrived by [facility].  He has been drinking vodka.  He has some in his bag.  He reports that he fell in a hole sometime in the past few days.  C/o back pain and b hes cold

## 2014-02-20 NOTE — Discharge Instructions (Signed)
°Emergency Department Resource Guide °1) Find a Doctor and Pay Out of Pocket °Although you won't have to find out who is covered by your insurance plan, it is a good idea to ask around and get recommendations. You will then need to call the office and see if the doctor you have chosen will accept you as a new patient and what types of options they offer for patients who are self-pay. Some doctors offer discounts or will set up payment plans for their patients who do not have insurance, but you will need to ask so you aren't surprised when you get to your appointment. ° °2) Contact Your Local Health Department °Not all health departments have doctors that can see patients for sick visits, but many do, so it is worth a call to see if yours does. If you don't know where your local health department is, you can check in your phone book. The CDC also has a tool to help you locate your state's health department, and many state websites also have listings of all of their local health departments. ° °3) Find a Walk-in Clinic °If your illness is not likely to be very severe or complicated, you may want to try a walk in clinic. These are popping up all over the country in pharmacies, drugstores, and shopping centers. They're usually staffed by nurse practitioners or physician assistants that have been trained to treat common illnesses and complaints. They're usually fairly quick and inexpensive. However, if you have serious medical issues or chronic medical problems, these are probably not your best option. ° °No Primary Care Doctor: °- Call [facility] Connect at  832-8000 - they can help you locate a primary care doctor that  accepts your insurance, provides certain services, etc. °- Physician Referral Service- 1-800-533-3463 ° °Chronic Pain Problems: °Organization         Address  Phone   Notes  °[facility]  (336) 297-2271 Patients need to be referred by their primary care doctor.  ° °Medication  Assistance: °Organization         Address  Phone   Notes  °[place] [facility] [address] °[place], [place] [address] (336) 641-8030 --Must be a resident of [place] °-- Must have NO insurance coverage whatsoever (no Medicaid/ Medicare, etc.) °-- The pt. MUST have a primary care doctor that directs their care regularly and follows them in the community °  °[facility]  (866) 331-1348   °[employer]  (888) 892-1162   ° °Agencies that provide inexpensive medical care: °Organization         Address  Phone   Notes  °[facility]  (336) 832-8035   °[facility]    (336) 832-7272   °[facility] [address] °[place], [place] [address] (336) 832-4777   °[facility] [address], °[place] (336) 271-4999   °[facility]    (336) 373-0678   °[facility]    (336) 272-1050   °[facility] ° [address], [place] Phone:  (336) 832-4444, Fax:  (336) 832-4440 Hours of Operation:  9 am - 6 pm, M-F.  Also accepts Medicaid/Medicare and self-pay.  °[facility] ° [address], [place] Phone: (336) 832-3150, Fax: (336) [phone]. Hours of Operation:  8:30 am - 5:30 pm, M-F.  Also accepts Medicaid and self-pay.  °[facility] [place] [address], [place] Phone: (336) 878-6027   °[facility] [address], [place], [place] (336)723-1848, Ext. 123 Mondays & Thursdays: 7-9 AM.  First 15 patients are seen on a first come, first serve basis. °  ° °Medicaid-accepting [place] Providers: ° °Organization         Address  Phone   Notes  °[facility] [address], [place] (336) 641-2100 Also accepts self-pay patients.  °[facility] [address], [place] ° (336) 856-9996   °[facility] [address], [place]  (336) 288-8857   °[facility] [address] [place] [address], [place] (336) 299-7000   °Veita Bland [address], [place]  ° (336) 373-1557 Only accepts [place] Access Medicaid patients after they have their name applied to their card.  ° °Self-Pay (no insurance) in [place]: ° °Organization         Address  Phone   Notes  °Sickle Cell Patients, [facility] [address], [place] (336) 832-1970   °[facility] [address], [place] (336) 832-4400   °[facility] ° [address],  (336) 992-4800   °[facility]/Dr. Osei-Bonsu ° [address] [place] [address], [place] or [address], [place] (336) 841-8500 Phone number for both [place] and [place] locations is the same.  °[facility] [address], [place] (336) 299-0000   °[facility] [address] [place] [address], [place] or [address] (336) 852-7530 °(336) 878-2260   °[facility] [address], [place] (336) 350-1642, phone; (336) 294-5005, fax Sees patients 1st and 3rd Saturday of every month.  Must not qualify for public or private insurance (i.e. Medicaid, Medicare, [place] Health Choice, Veterans' Benefits) • Household income should be no more than 200% of the poverty level •The clinic cannot treat you if you are pregnant or think you are pregnant • Sexually transmitted diseases are not treated at the clinic.  ° ° °Dental Care: °Organization         Address  Phone  Notes  °[place] [facility] [address], [place] (336) 641-6152 Accepts children up to age 21 who are enrolled in Medicaid or [place] Health Choice; pregnant women with a Medicaid card; and children who have applied for Medicaid or [place] Health Choice, but were declined, whose parents can pay a reduced fee at time of service.  °[place]  [facility] [place]  [address], [place] (336) 641-7733 Accepts children up to age 21 who are enrolled in Medicaid or [place] Health Choice; pregnant women with a Medicaid card; and children who have applied for Medicaid or [place] Health Choice, but were declined, whose parents can pay a reduced fee at time of service.  °[facility] ° [address],  (336) 641-4533 Patients are seen by appointment only. Walk-ins are not accepted. [facility] will see patients 18 years of age and older. °Monday - Tuesday (8am-5pm) °Most Wednesdays (8:30-5pm) °$30 per visit, cash only  °[facility] ° [address], [place] (336) 641-4533 Patients are seen by appointment only. Walk-ins are not accepted. [facility] will see patients 18 years of age and older. °One   Wednesday Evening (Monthly: Volunteer Based).  $30 per visit, cash only  °[facility]  (919) 537-3737 for adults; Children under age 4, call Graduate Pediatric Dentistry at (919) [phone]. Children aged 4-14, please call (919) [phone] to request a pediatric application. ° Dental services are provided in all areas of dental care including fillings, crowns and bridges, complete and partial dentures, implants, gum treatment, root canals, and extractions. Preventive care is also provided. Treatment is provided to both adults and children. °Patients are selected via a lottery and there is often a waiting list. °  °[facility] [address], °[place] ° (336) 763-8833 [email or web address] °  °[facility] [address], [place], [place] (336)723-1848, Ext. 123 Second and Fourth Thursday of each month, opens at 6:30 AM; Clinic ends at 9 AM.  Patients are seen on a first-come first-served basis, and a limited number are seen during each clinic.  ° °[facility] ° [address], [place], [place] (336) 723-7904    Eligibility Requirements °You must have lived in [place], [place], or [place] counties for at least the last three months. °  You cannot be eligible for state or federal sponsored healthcare insurance, including Veterans Administration, Medicaid, or Medicare. °  You generally cannot be eligible for healthcare insurance through your employer.  °  How to apply: °Eligibility screenings are held every Tuesday and Wednesday afternoon from 1:00 pm until 4:00 pm. You do not need an appointment for the interview!  °[facility] [address], [place], [place] 336-631-2330   °[facility]  336-342-8273   °[facility]  336-703-3100   °[facility]  336-570-6415   ° °Behavioral Health Resources in the Community: °Intensive Outpatient Programs °Organization         Address  Phone  Notes  °[facility] [address], [place], [place] 336-878-6098   °[facility] [address], [place], [place] 336-832-9800   °[facility] [address], [place], [place] ° 336-882-2125   °[facility] [address],  °[place], [place] 1-800-853-5163 or 336-641-4981   °Substance Abuse Resources °Organization         Address  Phone  Notes  °[facility]  336-882-2125   °[facility]  336-784-9470   °[facility]  336-285-9073   °[facility]  336-845-3988   °Residential & Outpatient Substance Abuse Program  1-800-659-3381   °Psychological Services °Organization         Address  Phone  Notes  °[facility]  336- 832-9600   °[facility]  336- 378-7881   °[facility] [address], [place] 1-800-853-5163 or 336-641-4981   ° °Mobile Crisis Teams °Organization         Address  Phone  Notes  °[facility], Mobile Crisis Care Unit  1-877-626-1772   °Assertive °[facility] ° [address]  [place], [place] 336-834-9664   °Sharon DeEsch [address] °[place] 336-554-5454   ° °Self-Help/Support Groups °Organization         Address  Phone             Notes  °[facility] - variety of support groups  336- 373-1402 Call for more information  °Narcotics Anonymous (NA), [facility] [address], °[place]  2 meetings at this location  ° °  Residential Treatment Programs °Organization         Address  Phone  Notes  °[facility] [address],    °[place]  1-866-801-8205   °[facility] ° [address], [place], [place] 704-293-8524   °[facility] [address], [place] 336-845-3988 Admissions: 8am-3pm M-F  °[facility] [address]    °[place], [place] 336-841-1104   °[facility] [address], [place], [place] 336-379-7146   °[facility] [address]  °[place], [place] 336-285-9073   °[facility] - Intensive Outpatient [address], [place], [place] 336-852-3033   °[facility] [address]  °[place], [place] 1-877-615-2722 or 336-784-9470   °[facility] [address] [place], [place] 336-227-7417 Accepts Medicaid  °[facility] [address]  °[place] 1-800-659-3381 Substance Abuse/Addiction Treatment  ° °[place] Behavioral Health Resources °Organization         Address  Phone  Notes  °[facility]  (888) 581-9988   °Julie Brannon, PhD [address], [place]   (336) 349-5553 or (336) 951-0000   °[facility]   [address] °[place], [place] (336) 349-4454   °[facility] [address], [place], [place] (336) 342-8316 Insurance/Medicaid/sponsorship through [facility]  °[facility] [address]                                    [place], [place] (336) 342-8316 Therapy/tele-psych/case    °[facility] [address]  ° [place], [place] (336) 349-2233    °Dr. Arfeen  (336) 349-4544   °[facility] [place]  [employer] [place] [facility] 1) [address], [place] °2) [address], [place] °3)  [address], [place] (336) 349-3220 °(336) 342-7768 ° °(336) 342-8140   °[place] [facility] (336) 342-1394 or (336) 342-3537 (After Hours)    ° ° °Take your usual prescriptions as previously directed.  Apply moist heat or ice to the area(s) of discomfort, for 15 minutes at a time, several times per day for the next few days.  Do not fall asleep on a heating or ice pack.  Call your regular medical doctor tomorrow to schedule a follow up appointment in the next 2 days.  Return to the Emergency Department immediately if worsening. ° °

## 2014-02-20 NOTE — ED Notes (Signed)
Pt. Ate 100% of breakfast 

## 2014-02-20 NOTE — ED Notes (Signed)
Informed pt that he was going to be discharged, tried to assist pt with calling for a ride, pt states he will ride the bus.
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
## 2014-02-20 NOTE — ED Notes (Signed)
Informed dr Clarene Dukemcmanus of pts ambulation and ordered pt a breakfast tray

## 2014-02-20 NOTE — ED Notes (Signed)
Patient transported to X-ray 

## 2014-02-20 NOTE — ED Notes (Signed)
Ambulated pt, pt was slow to move and was a little unsteady on his feet. Pt seemed more stable on his return to his room.

## 2014-04-23 ENCOUNTER — Encounter (HOSPITAL_COMMUNITY): Payer: Self-pay

## 2014-04-23 ENCOUNTER — Emergency Department (HOSPITAL_COMMUNITY): Payer: Self-pay

## 2014-04-23 ENCOUNTER — Emergency Department (HOSPITAL_COMMUNITY)
Admission: EM | Admit: 2014-04-23 | Discharge: 2014-04-23 | Disposition: A | Discharge status: Home / Self Care | Payer: Self-pay | Attending: Emergency Medicine | Admitting: Emergency Medicine

## 2014-04-23 DIAGNOSIS — S0093XA Contusion of unspecified part of head, initial encounter: Secondary | ICD-10-CM | POA: Insufficient documentation

## 2014-04-23 DIAGNOSIS — Z8669 Personal history of other diseases of the nervous system and sense organs: Secondary | ICD-10-CM | POA: Insufficient documentation

## 2014-04-23 DIAGNOSIS — K219 Gastro-esophageal reflux disease without esophagitis: Secondary | ICD-10-CM | POA: Insufficient documentation

## 2014-04-23 DIAGNOSIS — Y929 Unspecified place or not applicable: Secondary | ICD-10-CM | POA: Insufficient documentation

## 2014-04-23 DIAGNOSIS — Z8659 Personal history of other mental and behavioral disorders: Secondary | ICD-10-CM | POA: Insufficient documentation

## 2014-04-23 DIAGNOSIS — Z8701 Personal history of pneumonia (recurrent): Secondary | ICD-10-CM | POA: Insufficient documentation

## 2014-04-23 DIAGNOSIS — Y939 Activity, unspecified: Secondary | ICD-10-CM | POA: Insufficient documentation

## 2014-04-23 DIAGNOSIS — S0083XA Contusion of other part of head, initial encounter: Secondary | ICD-10-CM | POA: Insufficient documentation

## 2014-04-23 DIAGNOSIS — Z79899 Other long term (current) drug therapy: Secondary | ICD-10-CM | POA: Insufficient documentation

## 2014-04-23 DIAGNOSIS — Z720 Tobacco use: Secondary | ICD-10-CM | POA: Insufficient documentation

## 2014-04-23 DIAGNOSIS — S0990XA Unspecified injury of head, initial encounter: Secondary | ICD-10-CM

## 2014-04-23 DIAGNOSIS — Y998 Other external cause status: Secondary | ICD-10-CM | POA: Insufficient documentation

## 2014-04-23 DIAGNOSIS — J45909 Unspecified asthma, uncomplicated: Secondary | ICD-10-CM | POA: Insufficient documentation
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
## 2014-04-23 NOTE — ED Notes (Signed)
Patient returned from CT
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
## 2014-04-23 NOTE — Discharge Instructions (Signed)
Contusion °A contusion is a deep bruise. Contusions are the result of an injury that caused bleeding under the skin. The contusion may turn blue, purple, or yellow. Minor injuries will give you a painless contusion, but more severe contusions may stay painful and swollen for a few weeks.  °CAUSES  °A contusion is usually caused by a blow, trauma, or direct force to an area of the body. °SYMPTOMS  °· Swelling and redness of the injured area. °· Bruising of the injured area. °· Tenderness and soreness of the injured area. °· Pain. °DIAGNOSIS  °The diagnosis can be made by taking a history and physical exam. An X-Valleri Hendricksen, CT scan, or MRI may be needed to determine if there were any associated injuries, such as fractures. °TREATMENT  °Specific treatment will depend on what area of the body was injured. In general, the best treatment for a contusion is resting, icing, elevating, and applying cold compresses to the injured area. Over-the-counter medicines may also be recommended for pain control. Ask your caregiver what the best treatment is for your contusion. °HOME CARE INSTRUCTIONS  °· Put ice on the injured area. °¨ Put ice in a plastic bag. °¨ Place a towel between your skin and the bag. °¨ Leave the ice on for 15-20 minutes, 3-4 times a day, or as directed by your health care provider. °· Only take over-the-counter or prescription medicines for pain, discomfort, or fever as directed by your caregiver. Your caregiver may recommend avoiding anti-inflammatory medicines (aspirin, ibuprofen, and naproxen) for 48 hours because these medicines may increase bruising. °· Rest the injured area. °· If possible, elevate the injured area to reduce swelling. °SEEK IMMEDIATE MEDICAL CARE IF:  °· You have increased bruising or swelling. °· You have pain that is getting worse. °· Your swelling or pain is not relieved with medicines. °MAKE SURE YOU:  °· Understand these instructions. °· Will watch your condition. °· Will get help right  away if you are not doing well or get worse. °Document Released: 10/31/2004 Document Revised: 01/26/2013 Document Reviewed: 11/26/2010 °ExitCare® Patient Information ©2015 [employer]. This information is not intended to replace advice given to you by your health care provider. Make sure you discuss any questions you have with your health care provider. ° °

## 2014-04-26 ENCOUNTER — Emergency Department (HOSPITAL_COMMUNITY): Payer: Self-pay

## 2014-04-26 ENCOUNTER — Emergency Department (HOSPITAL_COMMUNITY)
Admission: EM | Admit: 2014-04-26 | Discharge: 2014-04-27 | Disposition: A | Discharge status: Home / Self Care | Payer: Self-pay | Attending: Emergency Medicine | Admitting: Emergency Medicine

## 2014-04-26 ENCOUNTER — Encounter (HOSPITAL_COMMUNITY): Payer: Self-pay

## 2014-04-26 DIAGNOSIS — S0990XA Unspecified injury of head, initial encounter: Secondary | ICD-10-CM | POA: Insufficient documentation

## 2014-04-26 DIAGNOSIS — Z8669 Personal history of other diseases of the nervous system and sense organs: Secondary | ICD-10-CM | POA: Insufficient documentation

## 2014-04-26 DIAGNOSIS — Z8679 Personal history of other diseases of the circulatory system: Secondary | ICD-10-CM | POA: Insufficient documentation

## 2014-04-26 DIAGNOSIS — R519 Headache, unspecified: Secondary | ICD-10-CM

## 2014-04-26 DIAGNOSIS — R51 Headache: Secondary | ICD-10-CM

## 2014-04-26 DIAGNOSIS — F10920 Alcohol use, unspecified with intoxication, uncomplicated: Secondary | ICD-10-CM

## 2014-04-26 DIAGNOSIS — J45909 Unspecified asthma, uncomplicated: Secondary | ICD-10-CM | POA: Insufficient documentation

## 2014-04-26 DIAGNOSIS — F10120 Alcohol abuse with intoxication, uncomplicated: Secondary | ICD-10-CM | POA: Insufficient documentation

## 2014-04-26 DIAGNOSIS — Z720 Tobacco use: Secondary | ICD-10-CM | POA: Insufficient documentation

## 2014-04-26 DIAGNOSIS — Y998 Other external cause status: Secondary | ICD-10-CM | POA: Insufficient documentation

## 2014-04-26 DIAGNOSIS — Z79899 Other long term (current) drug therapy: Secondary | ICD-10-CM | POA: Insufficient documentation

## 2014-04-26 DIAGNOSIS — Y9289 Other specified places as the place of occurrence of the external cause: Secondary | ICD-10-CM | POA: Insufficient documentation

## 2014-04-26 DIAGNOSIS — Z8701 Personal history of pneumonia (recurrent): Secondary | ICD-10-CM | POA: Insufficient documentation

## 2014-04-26 DIAGNOSIS — K219 Gastro-esophageal reflux disease without esophagitis: Secondary | ICD-10-CM | POA: Insufficient documentation

## 2014-04-26 DIAGNOSIS — Y9389 Activity, other specified: Secondary | ICD-10-CM | POA: Insufficient documentation

## 2014-04-26 LAB — COMPREHENSIVE METABOLIC PANEL
ALK PHOS: 116 U/L (ref 39–117)
ALT: 29 U/L (ref 0–53)
AST: 30 U/L (ref 0–37)
Albumin: 4.3 g/dL (ref 3.5–5.2)
Anion gap: 13 (ref 5–15)
BUN: 11 mg/dL (ref 6–23)
CHLORIDE: 106 mmol/L (ref 96–112)
CO2: 20 mmol/L (ref 19–32)
Calcium: 8.6 mg/dL (ref 8.4–10.5)
Creatinine, Ser: 0.98 mg/dL (ref 0.50–1.35)
GFR calc Af Amer: >90 mL/min (ref >90)
GFR calc non Af Amer: >90 mL/min (ref >90)
Glucose, Bld: 133 mg/dL — ABNORMAL HIGH (ref 70–99)
POTASSIUM: 4.4 mmol/L (ref 3.5–5.1)
SODIUM: 139 mmol/L (ref 135–145)
Total Bilirubin: 0.6 mg/dL (ref 0.3–1.2)
Total Protein: 7.5 g/dL (ref 6.0–8.3)

## 2014-04-26 LAB — CBC WITH DIFFERENTIAL/PLATELET
Basophils Absolute: 0 10*3/uL (ref 0.0–0.1)
Basophils Relative: 0 % (ref 0–1)
EOS ABS: 0.1 10*3/uL (ref 0.0–0.7)
Eosinophils Relative: 1 % (ref 0–5)
HCT: 38.2 % — ABNORMAL LOW (ref 39.0–52.0)
HEMOGLOBIN: 11.7 g/dL — AB (ref 13.0–17.0)
LYMPHS PCT: 67 % — AB (ref 12–46)
Lymphs Abs: 4.7 10*3/uL — ABNORMAL HIGH (ref 0.7–4.0)
MCH: 20.9 pg — AB (ref 26.0–34.0)
MCHC: 30.6 g/dL (ref 30.0–36.0)
MCV: 68.2 fL — ABNORMAL LOW (ref 78.0–100.0)
Monocytes Absolute: 0.4 10*3/uL (ref 0.1–1.0)
Monocytes Relative: 5 % (ref 3–12)
NEUTROS PCT: 27 % — AB (ref 43–77)
Neutro Abs: 1.9 10*3/uL (ref 1.7–7.7)
PLATELETS: 279 10*3/uL (ref 150–400)
RBC: 5.6 MIL/uL (ref 4.22–5.81)
RDW: 16.9 % — ABNORMAL HIGH (ref 11.5–15.5)
WBC: 7.1 10*3/uL (ref 4.0–10.5)

## 2014-04-26 LAB — ETHANOL: ALCOHOL ETHYL (B): 221 mg/dL — AB (ref 0–9)

## 2014-04-26 MED ORDER — DIPHENHYDRAMINE HCL 50 MG/ML IJ SOLN
12.5000 mg | Freq: Once | INTRAMUSCULAR | Status: AC
  Administered 2014-04-26: 12.5 mg via INTRAVENOUS
  Filled 2014-04-26: qty 1

## 2014-04-26 MED ORDER — METOCLOPRAMIDE HCL 5 MG/ML IJ SOLN
10.0000 mg | Freq: Once | INTRAMUSCULAR | Status: AC
  Administered 2014-04-26: 10 mg via INTRAVENOUS
  Filled 2014-04-26: qty 2

## 2014-04-26 MED ORDER — SODIUM CHLORIDE 0.9 % IV BOLUS (SEPSIS)
1000.0000 mL | Freq: Once | INTRAVENOUS | Status: AC
  Administered 2014-04-26: 1000 mL via INTRAVENOUS
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
## 2014-04-26 NOTE — ED Notes (Signed)
Pt was able to walk with no problems, pt asked for pain meds after ambulation but did not mention any pain while walking.
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
## 2014-04-27 MED ORDER — GI COCKTAIL ~~LOC~~
30.0000 mL | Freq: Once | ORAL | Status: AC
  Administered 2014-04-27: 30 mL via ORAL
  Filled 2014-04-27: qty 30

## 2014-04-27 MED ORDER — ONDANSETRON 8 MG PO TBDP: 8.0000 mg | ORAL_TABLET | Freq: Three times a day (TID) | ORAL | Status: AC | PRN

## 2014-04-27 NOTE — ED Notes (Signed)
Patient ambulated in hallway with steady gait. Reports abdominal pain. Reported to Dr. Norlene Campbelltter.

## 2014-04-27 NOTE — Discharge Instructions (Signed)
Alcohol and Nutrition °Nutrition serves two purposes. It provides energy. It also maintains body structure and function. Food supplies energy. It also provides the building blocks needed to replace worn or damaged cells. Alcoholics often eat poorly. This limits their supply of essential nutrients. This affects energy supply and structure maintenance. Alcohol also affects the body's nutrients in: °· Digestion. °· Storage. °· Using and getting rid of waste products. °IMPAIRMENT OF NUTRIENT DIGESTION AND UTILIZATION  °· Once ingested, food must be broken down into small components (digested). Then it is available for energy. It helps maintain body structure and function. Digestion begins in the mouth. It continues in the stomach and intestines, with help from the pancreas. The nutrients from digested food are absorbed from the intestines into the blood. Then they are carried to the liver. The liver prepares nutrients for: °· Immediate use. °· Storage and future use. °· Alcohol inhibits the breakdown of nutrients into usable molecules. °· It decreases secretion of digestive enzymes from the pancreas. °· Alcohol impairs nutrient absorption by damaging the cells lining the stomach and intestines. °· It also interferes with moving some nutrients into the blood. °· In addition, nutritional deficiencies themselves may lead to further absorption problems. °· For example, folate deficiency changes the cells that line the small intestine. This impairs how water is absorbed. It also affects absorbed nutrients. These include glucose, sodium, and additional folate. °· Even if nutrients are digested and absorbed, alcohol can prevent them from being fully used. It changes their transport, storage, and excretion. Impaired utilization of nutrients by alcoholics is indicated by: °· Decreased liver stores of vitamins, such as vitamin A. °· Increased excretion of nutrients such as fat. °ALCOHOL AND ENERGY SUPPLY  °· Three basic  nutritional components found in food are: °¨ Carbohydrates. °¨ Proteins. °¨ Fats. °· These are used as energy. Some alcoholics take in as much as 50% of their total daily calories from alcohol. They often neglect important foods. °· Even when enough food is eaten, alcohol can impair the ways the body controls blood sugar (glucose) levels. It may either increase or decrease blood sugar. °¨ In non-diabetic alcoholics, increased blood sugar (hyperglycemia) is caused by poor insulin secretion. It is usually temporary. °¨ Decreased blood sugar (hypoglycemia) can cause serious injury even if this condition is short-lived. Low blood sugar can happen when a fasting or malnourished person drinks alcohol. When there is no food to supply energy, stored sugar is used up. The products of alcohol inhibit forming glucose from other compounds such as amino acids. As a result, alcohol causes the brain and other body tissue to lack glucose. It is needed for energy and function. °· Alcohol is an energy source. But how the body processes and uses the energy from alcohol is complex. Also, when alcohol is substituted for carbohydrates, subjects tend to lose weight. This indicates that they get less energy from alcohol than from food. °ALCOHOL - MAINTAINING CELL STRUCTURE AND FUNCTION  °Structure °Cells are made mostly of protein. So an adequate protein diet is important for maintaining cell structure. This is especially true if cells are being damaged. Research indicates that alcohol affects protein nutrition by causing impaired: °· Digestion of proteins to amino acids. °· Processing of amino acids by the small intestine and liver. °· Synthesis of proteins from amino acids. °· Protein secretion by the liver. °Function °Nutrients are essential for the body to function well. They provide the tools that the body needs to work well:  °·   Proteins. °· Vitamins. °· Minerals. °Alcohol can disrupt body function. It may cause nutrient  deficiencies. And it may interfere with the way nutrients are processed. °Vitamins °· Vitamins are essential to maintain growth and normal metabolism. They regulate many of the body`s processes. Chronic heavy drinking causes deficiencies in many vitamins. This is caused by eating less. And, in some cases, vitamins may be poorly absorbed. For example, alcohol inhibits fat absorption. It impairs how the vitamins A, E, and D are normally absorbed along with dietary fats. Not enough vitamin A may cause night blindness. Not enough vitamin D may cause softening of the bones. °· Some alcoholics lack vitamins A, C, D, E, K, and the B vitamins. These are all involved in wound healing and cell maintenance. In particular, because vitamin K is necessary for blood clotting, lacking that vitamin can cause delayed clotting. The result is excess bleeding. Lacking other vitamins involved in brain function may cause severe neurological damage. °Minerals °Deficiencies of minerals such as calcium, magnesium, iron, and zinc are common in alcoholics. The alcohol itself does not seem to affect how these minerals are absorbed. Rather, they seem to occur secondary to other alcohol-related problems, such as: °· Less calcium absorbed. °· Not enough magnesium. °· More urinary excretion. °· Vomiting. °· Diarrhea. °· Not enough iron due to gastrointestinal bleeding. °· Not enough zinc or losses related to other nutrient deficiencies. °· Mineral deficiencies can cause a variety of medical consequences. These range from calcium-related bone disease to zinc-related night blindness and skin lesions. °ALCOHOL, MALNUTRITION, AND MEDICAL COMPLICATIONS  °Liver Disease  °· Alcoholic liver damage is caused primarily by alcohol itself. But poor nutrition may increase the risk of alcohol-related liver damage. For example, nutrients normally found in the liver are known to be affected by drinking alcohol. These include carotenoids, which are the major  sources of vitamin A, and vitamin E compounds. Decreases in such nutrients may play some role in alcohol-related liver damage. °Pancreatitis °· Research suggests that malnutrition may increase the risk of developing alcoholic pancreatitis. Research suggests that a diet lacking in protein may increase alcohol's damaging effect on the pancreas. °Brain °· Nutritional deficiencies may have severe effects on brain function. These may be permanent. Specifically, thiamine deficiencies are often seen in alcoholics. They can cause severe neurological problems. These include: °¨ Impaired movement. °¨ Memory loss seen in Wernicke-Korsakoff syndrome. °Pregnancy °· Alcohol has toxic effects on fetal development. It causes alcohol-related birth defects. They include fetal alcohol syndrome. Alcohol itself is toxic to the fetus. Also, the nutritional deficiency can affect how the fetus develops. That may compound the risk of developmental damage. °· Nutritional needs during pregnancy are 10% to 30% greater than normal. Food intake can increase by as much as 140% to cover the needs of both mother and fetus. An alcoholic mother`s nutritional problems may adversely affect the nutrition of the fetus. And alcohol itself can also restrict nutrition flow to the fetus. °NUTRITIONAL STATUS OF ALCOHOLICS  °Techniques for assessing nutritional status include: °· Taking body measurements to estimate fat reserves. They include: °¨ Weight. °¨ Height. °¨ Mass. °¨ Skin fold thickness. °· Performing blood analysis to provide measurements of circulating: °¨ Proteins. °¨ Vitamins. °¨ Minerals. °· These techniques tend to be imprecise. For many nutrients, there is no clear "cut-off" point that would allow an accurate definition of deficiency. So assessing the nutritional status of alcoholics is limited by these techniques. Dietary status may provide information about the risk of developing nutritional problems.   Dietary status is assessed by: °¨ Taking  patients' dietary histories. °¨ Evaluating the amount and types of food they are eating. °· It is difficult to determine what exact amount of alcohol begins to have damaging effects on nutrition. In general, moderate drinkers have 2 drinks or less per day. They seem to be at little risk for nutritional problems. Various medical disorders begin to appear at greater levels. °· Research indicates that the majority of even the heaviest drinkers have few obvious nutritional deficiencies. Many alcoholics who are hospitalized for medical complications of their disease do have severe malnutrition. Alcoholics tend to eat poorly. Often they eat less than the amounts of food necessary to provide enough: °¨ Carbohydrates. °¨ Protein. °¨ Fat. °¨ Vitamins A and C. °¨ B vitamins. °¨ Minerals like calcium and iron. °Of major concern is alcohol's effect on digesting food and use of nutrients. It may shift a mildly malnourished person toward severe malnutrition. °Document Released: 11/15/2004 Document Revised: 04/15/2011 Document Reviewed: 05/01/2005 °[employer]® Patient Information ©2015 [employer]. This information is not intended to replace advice given to you by your health care provider. Make sure you discuss any questions you have with your health care provider. ° °Alcohol Use Disorder °Alcohol use disorder is a mental disorder. It is not a one-time incident of heavy drinking. Alcohol use disorder is the excessive and uncontrollable use of alcohol over time that leads to problems with functioning in one or more areas of daily living. People with this disorder risk harming themselves and others when they drink to excess. Alcohol use disorder also can cause other mental disorders, such as mood and anxiety disorders, and serious physical problems. People with alcohol use disorder often misuse other drugs.  °Alcohol use disorder is common and widespread. Some people with this disorder drink alcohol to cope with or escape from  negative life events. Others drink to relieve chronic pain or symptoms of mental illness. People with a family history of alcohol use disorder are at higher risk of losing control and using alcohol to excess.  °SYMPTOMS  °Signs and symptoms of alcohol use disorder may include the following:  °· Consumption of alcohol in larger amounts or over a longer period of time than intended. °· Multiple unsuccessful attempts to cut down or control alcohol use.   °· A great deal of time spent obtaining alcohol, using alcohol, or recovering from the effects of alcohol (hangover). °· A strong desire or urge to use alcohol (cravings).   °· Continued use of alcohol despite problems at work, school, or home because of alcohol use.   °· Continued use of alcohol despite problems in relationships because of alcohol use. °· Continued use of alcohol in situations when it is physically hazardous, such as driving a car. °· Continued use of alcohol despite awareness of a physical or psychological problem that is likely related to alcohol use. Physical problems related to alcohol use can involve the brain, heart, liver, stomach, and intestines. Psychological problems related to alcohol use include intoxication, depression, anxiety, psychosis, delirium, and dementia.   °· The need for increased amounts of alcohol to achieve the same desired effect, or a decreased effect from the consumption of the same amount of alcohol (tolerance). °· Withdrawal symptoms upon reducing or stopping alcohol use, or alcohol use to reduce or avoid withdrawal symptoms. Withdrawal symptoms include: °¨ Racing heart. °¨ Hand tremor. °¨ Difficulty sleeping. °¨ Nausea. °¨ Vomiting. °¨ Hallucinations. °¨ Restlessness. °¨ Seizures. °DIAGNOSIS °Alcohol use disorder is diagnosed through an assessment by your health care   provider. Your health care provider may start by asking three or four questions to screen for excessive or problematic alcohol use. To confirm a diagnosis  of alcohol use disorder, at least two symptoms must be present within a 12-month period. The severity of alcohol use disorder depends on the number of symptoms: °· Mild--two or three. °· Moderate--four or five. °· Severe--six or more. °Your health care provider may perform a physical exam or use results from lab tests to see if you have physical problems resulting from alcohol use. Your health care provider may refer you to a mental health professional for evaluation. °TREATMENT  °Some people with alcohol use disorder are able to reduce their alcohol use to low-risk levels. Some people with alcohol use disorder need to quit drinking alcohol. When necessary, mental health professionals with specialized training in substance use treatment can help. Your health care provider can help you decide how severe your alcohol use disorder is and what type of treatment you need. The following forms of treatment are available:  °· Detoxification. Detoxification involves the use of prescription medicines to prevent alcohol withdrawal symptoms in the first week after quitting. This is important for people with a history of symptoms of withdrawal and for heavy drinkers who are likely to have withdrawal symptoms. Alcohol withdrawal can be dangerous and, in severe cases, cause death. Detoxification is usually provided in a hospital or in-patient substance use treatment facility. °· Counseling or talk therapy. Talk therapy is provided by substance use treatment counselors. It addresses the reasons people use alcohol and ways to keep them from drinking again. The goals of talk therapy are to help people with alcohol use disorder find healthy activities and ways to cope with life stress, to identify and avoid triggers for alcohol use, and to handle cravings, which can cause relapse. °· Medicines. Different medicines can help treat alcohol use disorder through the following actions: °¨ Decrease alcohol cravings. °¨ Decrease the positive  reward response felt from alcohol use. °¨ Produce an uncomfortable physical reaction when alcohol is used (aversion therapy). °· Support groups. Support groups are run by people who have quit drinking. They provide emotional support, advice, and guidance. °These forms of treatment are often combined. Some people with alcohol use disorder benefit from intensive combination treatment provided by specialized substance use treatment centers. Both inpatient and outpatient treatment programs are available. °Document Released: 02/29/2004 Document Revised: 06/07/2013 Document Reviewed: 04/30/2012 °[employer]® Patient Information ©2015 [employer]. This information is not intended to replace advice given to you by your health care provider. Make sure you discuss any questions you have with your health care provider. ° °

## 2014-05-06 ENCOUNTER — Ambulatory Visit: Payer: Self-pay | Admitting: Internal Medicine

## 2014-06-24 ENCOUNTER — Encounter (HOSPITAL_COMMUNITY): Payer: Self-pay | Admitting: *Deleted

## 2014-06-24 ENCOUNTER — Emergency Department (HOSPITAL_COMMUNITY)
Admission: EM | Admit: 2014-06-24 | Discharge: 2014-06-25 | Disposition: A | Discharge status: Home / Self Care | Payer: Self-pay | Attending: Emergency Medicine | Admitting: Emergency Medicine

## 2014-06-24 ENCOUNTER — Emergency Department (HOSPITAL_COMMUNITY): Payer: Self-pay

## 2014-06-24 DIAGNOSIS — Z9089 Acquired absence of other organs: Secondary | ICD-10-CM | POA: Insufficient documentation

## 2014-06-24 DIAGNOSIS — Z720 Tobacco use: Secondary | ICD-10-CM | POA: Insufficient documentation

## 2014-06-24 DIAGNOSIS — R140 Abdominal distension (gaseous): Secondary | ICD-10-CM | POA: Insufficient documentation

## 2014-06-24 DIAGNOSIS — R112 Nausea with vomiting, unspecified: Secondary | ICD-10-CM | POA: Insufficient documentation

## 2014-06-24 DIAGNOSIS — Z79899 Other long term (current) drug therapy: Secondary | ICD-10-CM | POA: Insufficient documentation

## 2014-06-24 DIAGNOSIS — Z8701 Personal history of pneumonia (recurrent): Secondary | ICD-10-CM | POA: Insufficient documentation

## 2014-06-24 DIAGNOSIS — Z8659 Personal history of other mental and behavioral disorders: Secondary | ICD-10-CM | POA: Insufficient documentation

## 2014-06-24 DIAGNOSIS — R1084 Generalized abdominal pain: Secondary | ICD-10-CM | POA: Insufficient documentation

## 2014-06-24 DIAGNOSIS — K219 Gastro-esophageal reflux disease without esophagitis: Secondary | ICD-10-CM | POA: Insufficient documentation

## 2014-06-24 DIAGNOSIS — J45909 Unspecified asthma, uncomplicated: Secondary | ICD-10-CM | POA: Insufficient documentation

## 2014-06-24 DIAGNOSIS — Z8669 Personal history of other diseases of the nervous system and sense organs: Secondary | ICD-10-CM | POA: Insufficient documentation

## 2014-06-24 LAB — COMPREHENSIVE METABOLIC PANEL
ALBUMIN: 4.2 g/dL (ref 3.5–5.0)
ALK PHOS: 116 U/L (ref 38–126)
ALT: 29 U/L (ref 17–63)
AST: 20 U/L (ref 15–41)
Anion gap: 14 (ref 5–15)
BUN: 8 mg/dL (ref 6–20)
CO2: 21 mmol/L — ABNORMAL LOW (ref 22–32)
Calcium: 9 mg/dL (ref 8.9–10.3)
Chloride: 107 mmol/L (ref 101–111)
Creatinine, Ser: 0.79 mg/dL (ref 0.61–1.24)
GFR calc Af Amer: >60 mL/min (ref >60)
GFR calc non Af Amer: >60 mL/min (ref >60)
Glucose, Bld: 119 mg/dL — ABNORMAL HIGH (ref 65–99)
Potassium: 3.9 mmol/L (ref 3.5–5.1)
Sodium: 142 mmol/L (ref 135–145)
Total Bilirubin: 0.4 mg/dL (ref 0.3–1.2)
Total Protein: 7.6 g/dL (ref 6.5–8.1)

## 2014-06-24 LAB — CBC WITH DIFFERENTIAL/PLATELET
Basophils Absolute: 0 10*3/uL (ref 0.0–0.1)
Basophils Relative: 0 % (ref 0–1)
EOS PCT: 1 % (ref 0–5)
Eosinophils Absolute: 0.1 10*3/uL (ref 0.0–0.7)
HEMATOCRIT: 42 % (ref 39.0–52.0)
HEMOGLOBIN: 13.2 g/dL (ref 13.0–17.0)
Lymphocytes Relative: 41 % (ref 12–46)
Lymphs Abs: 2.5 10*3/uL (ref 0.7–4.0)
MCH: 21.9 pg — ABNORMAL LOW (ref 26.0–34.0)
MCHC: 31.4 g/dL (ref 30.0–36.0)
MCV: 69.5 fL — AB (ref 78.0–100.0)
MONOS PCT: 7 % (ref 3–12)
Monocytes Absolute: 0.4 10*3/uL (ref 0.1–1.0)
NEUTROS ABS: 3.1 10*3/uL (ref 1.7–7.7)
Neutrophils Relative %: 51 % (ref 43–77)
Platelets: 244 10*3/uL (ref 150–400)
RBC: 6.04 MIL/uL — ABNORMAL HIGH (ref 4.22–5.81)
RDW: 17.4 % — ABNORMAL HIGH (ref 11.5–15.5)
WBC: 6.1 10*3/uL (ref 4.0–10.5)

## 2014-06-24 LAB — URINALYSIS, ROUTINE W REFLEX MICROSCOPIC
Bilirubin Urine: NEGATIVE
Glucose, UA: NEGATIVE mg/dL
HGB URINE DIPSTICK: NEGATIVE
Ketones, ur: NEGATIVE mg/dL
Leukocytes, UA: NEGATIVE
Nitrite: NEGATIVE
PH: 5 (ref 5.0–8.0)
PROTEIN: NEGATIVE mg/dL
Specific Gravity, Urine: 1.004 — ABNORMAL LOW (ref 1.005–1.030)
UROBILINOGEN UA: 0.2 mg/dL (ref 0.0–1.0)

## 2014-06-24 LAB — LIPASE, BLOOD: LIPASE: 32 U/L (ref 22–51)

## 2014-06-24 LAB — POC OCCULT BLOOD, ED: Fecal Occult Bld: NEGATIVE

## 2014-06-24 MED ORDER — OXYCODONE-ACETAMINOPHEN 5-325 MG PO TABS: 2.0000 | ORAL_TABLET | ORAL | Status: AC | PRN

## 2014-06-24 MED ORDER — HYDROMORPHONE HCL 1 MG/ML IJ SOLN
1.0000 mg | Freq: Once | INTRAMUSCULAR | Status: AC
  Administered 2014-06-24: 1 mg via INTRAVENOUS
  Filled 2014-06-24: qty 1

## 2014-06-24 MED ORDER — GI COCKTAIL ~~LOC~~
30.0000 mL | Freq: Once | ORAL | Status: AC
  Administered 2014-06-24: 30 mL via ORAL
  Filled 2014-06-24: qty 30

## 2014-06-24 MED ORDER — IOHEXOL 300 MG/ML  SOLN
25.0000 mL | Freq: Once | INTRAMUSCULAR | Status: AC | PRN
  Administered 2014-06-24: 25 mL via ORAL

## 2014-06-24 MED ORDER — SODIUM CHLORIDE 0.9 % IV BOLUS (SEPSIS)
500.0000 mL | Freq: Once | INTRAVENOUS | Status: AC
  Administered 2014-06-24: 500 mL via INTRAVENOUS

## 2014-06-24 MED ORDER — SUCRALFATE 1 G PO TABS: 1.0000 g | ORAL_TABLET | Freq: Three times a day (TID) | ORAL | Status: AC

## 2014-06-24 MED ORDER — ONDANSETRON HCL 4 MG/2ML IJ SOLN
4.0000 mg | Freq: Once | INTRAMUSCULAR | Status: AC
  Administered 2014-06-24: 4 mg via INTRAVENOUS
  Filled 2014-06-24: qty 2

## 2014-06-24 MED ORDER — IOHEXOL 300 MG/ML  SOLN
100.0000 mL | Freq: Once | INTRAMUSCULAR | Status: AC | PRN
  Administered 2014-06-24: 100 mL via INTRAVENOUS

## 2014-06-24 NOTE — Discharge Instructions (Signed)

## 2014-06-24 NOTE — ED Notes (Signed)
Pt arrived by [facility]. Reports onset of mid abd pain today. Having nausea, no vomiting. Pain increases with movement. Hx of gerd and reports +etoh today.

## 2014-06-24 NOTE — ED Provider Notes (Addendum)
CSN: [id number]     Arrival date & time 06/24/14  1818 History   First MD Initiated Contact with Patient 06/24/14 1915     Chief Complaint  Patient presents with  . Abdominal Pain     (Consider location/radiation/quality/duration/timing/severity/associated sxs/prior Treatment) Patient is a 49 y.o. male presenting with abdominal pain. The history is provided by the patient.  Abdominal Pain Pain location:  Generalized Pain quality: aching and sharp   Pain radiates to:  Does not radiate Pain severity:  Moderate Onset quality:  Sudden Duration:  1 day Timing:  Constant Progression:  Worsening Chronicity:  New Context: alcohol use   Relieved by:  Nothing Worsened by:  Nothing tried Ineffective treatments:  None tried Associated symptoms: nausea and vomiting   Associated symptoms: no chest pain, no cough, no diarrhea, no dysuria, no fever, no hematuria and no shortness of breath     Past Medical History  Diagnosis Date  . ETOH abuse   . Tobacco abuse   . Enlarged heart     "when I was little" (09/18/2012)  . Chest pain     "only related to asthma attack" (09/18/2012)  . Shortness of breath     " related to asthma attack; sometimes in the mornings too" (09/18/2012)  . Pneumonia   . Anxiety   . Sleep apnea     "I don't wear a mask" (09/18/2012)  . Asthma   . GERD (gastroesophageal reflux disease)   . Headache(784.0)     "only related to asthma attack" (09/18/2012)   Past Surgical History  Procedure Laterality Date  . Laparoscopic cholecystectomy  2007    [place] in [place]  . Nasal sinus surgery Right 10/01/2013    Procedure: ENDOSCOPIC Transantral Repair of Right Orbital Floor Fracture ;  Surgeon: Christia Readingwight Bates, MD;  [facility];  Service: ENT;  Laterality: Right;  . Esophagogastroduodenoscopy N/A 01/01/2014    Procedure: ESOPHAGOGASTRODUODENOSCOPY (EGD);  Surgeon: Theda BelfastPatrick D Hung, MD;  [facility];  Service: Endoscopy;  Laterality: N/A;   Family History   Problem Relation Age of Onset  . Heart attack Paternal Grandfather 3858  . Colon cancer Paternal Grandmother   . Stroke Sister    History  Substance Use Topics  . Smoking status: Current Some Day Smoker -- 0.25 packs/day for .3 years    Types: Cigarettes  . Smokeless tobacco: Never Used  . Alcohol Use: 0.0 oz/week    0 Standard drinks or equivalent per week     Comment: 3-4 cans of beer on the weekends    Review of Systems  Constitutional: Negative for fever.  HENT: Negative for drooling and rhinorrhea.   Eyes: Negative for pain.  Respiratory: Negative for cough and shortness of breath.   Cardiovascular: Negative for chest pain and leg swelling.  Gastrointestinal: Positive for nausea, vomiting and abdominal pain. Negative for diarrhea.  Genitourinary: Negative for dysuria and hematuria.  Musculoskeletal: Negative for gait problem and neck pain.  Skin: Negative for color change.  Neurological: Negative for numbness and headaches.  Hematological: Negative for adenopathy.  Psychiatric/Behavioral: Negative for behavioral problems.  All other systems reviewed and are negative.     Allergies  Review of patient's allergies indicates no known allergies.  Home Medications   Prior to Admission medications   Medication Sig Start Date End Date Taking? Authorizing Provider  acetaminophen (TYLENOL) 325 MG tablet Take 1 tablet (325 mg total) by mouth every 6 (six) hours as needed for mild pain, fever or headache.  Patient not taking: Reported on 04/26/2014 01/04/14   Leatha Gildingostin M Gherghe, MD  albuterol (PROVENTIL HFA;VENTOLIN HFA) 108 (90 BASE) MCG/ACT inhaler Inhale 2 puffs into the lungs every 6 (six) hours as needed for wheezing or shortness of breath. 01/06/14   Doris Cheadleeepak Advani, MD  ondansetron (ZOFRAN ODT) 8 MG disintegrating tablet Take 1 tablet (8 mg total) by mouth every 8 (eight) hours as needed for nausea or vomiting. 04/27/14   Marisa Severinlga Otter, MD  pantoprazole (PROTONIX) 40 MG tablet Take 1  tablet (40 mg total) by mouth daily at 6 (six) AM. 01/04/14   Costin Otelia SergeantM Gherghe, MD  polyvinyl alcohol (ARTIFICIAL TEARS) 1.4 % ophthalmic solution Place 1 drop into both eyes as needed for dry eyes. 01/06/14   Doris Cheadleeepak Advani, MD  Vitamin D, Ergocalciferol, (DRISDOL) 50000 UNITS CAPS capsule Take 1 capsule (50,000 Units total) by mouth every 7 (seven) days. Patient not taking: Reported on 04/23/2014 01/07/14   Doris Cheadleeepak Advani, MD   BP 120/80 mmHg  Pulse 112  Temp(Src) 97.5 F (36.4 C) (Oral)  Resp 18  Ht 5\' 5"  (1.651 m)  Wt 170 lb (77.111 kg)  BMI 28.29 kg/m2  SpO2 96% Physical Exam  Constitutional: He is oriented to person, place, and time. He appears well-developed and well-nourished.  HENT:  Head: Normocephalic and atraumatic.  Right Ear: External ear normal.  Left Ear: External ear normal.  Nose: Nose normal.  Mouth/Throat: Oropharynx is clear and moist. No oropharyngeal exudate.  Eyes: Conjunctivae and EOM are normal. Pupils are equal, round, and reactive to light.  Neck: Normal range of motion. Neck supple.  Cardiovascular: Regular rhythm, normal heart sounds and intact distal pulses.  Exam reveals no gallop and no friction rub.   No murmur heard. HR 102  Pulmonary/Chest: Effort normal and breath sounds normal. No respiratory distress. He has no wheezes.  Abdominal: Soft. Bowel sounds are normal. He exhibits distension (mild). There is tenderness (diffuse mild ttp). There is no rebound and no guarding.  Musculoskeletal: Normal range of motion. He exhibits no edema or tenderness.  Neurological: He is alert and oriented to person, place, and time.  Skin: Skin is warm and dry.  Psychiatric: He has a normal mood and affect. His behavior is normal.  Nursing note and vitals reviewed.   ED Course  Procedures (including critical care time) Labs Review Labs Reviewed  CBC WITH DIFFERENTIAL/PLATELET - Abnormal; Notable for the following:    RBC 6.04 (*)    MCV 69.5 (*)    MCH 21.9 (*)     RDW 17.4 (*)    All other components within normal limits  COMPREHENSIVE METABOLIC PANEL - Abnormal; Notable for the following:    CO2 21 (*)    Glucose, Bld 119 (*)    All other components within normal limits  URINALYSIS, ROUTINE W REFLEX MICROSCOPIC - Abnormal; Notable for the following:    Specific Gravity, Urine 1.004 (*)    All other components within normal limits  LIPASE, BLOOD  OCCULT BLOOD X 1 CARD TO LAB, STOOL  POC OCCULT BLOOD, ED    Imaging Review Ct Abdomen Pelvis W Contrast  06/24/2014   CLINICAL DATA:  Acute onset of generalized abdominal pain, nausea and vomiting. Initial encounter.  EXAM: CT ABDOMEN AND PELVIS WITH CONTRAST  TECHNIQUE: Multidetector CT imaging of the abdomen and pelvis was performed using the standard protocol following bolus administration of intravenous contrast.  CONTRAST:  100mL OMNIPAQUE IOHEXOL 300 MG/ML  SOLN  COMPARISON:  Lumbar spine  radiograph performed 02/10/2014, and CT of the abdomen and pelvis performed 12/29/2013  FINDINGS: The visualized lung bases are clear.  The liver and spleen are unremarkable in appearance. The patient is status post cholecystectomy, with clips noted along the gallbladder fossa. The pancreas and adrenal glands are unremarkable.  A 9 mm cyst is noted at the medial aspect of the left kidney. Mild right-sided hydronephrosis is noted, with a right-sided extrarenal pelvis, and mild prominence of the right ureter. No distal obstructing stone is seen, and this may reflect a recently passed stone. No significant perinephric stranding is appreciated. No renal or ureteral stones are identified at this time.  No free fluid is identified. The small bowel is unremarkable in appearance. The stomach is within normal limits. No acute vascular abnormalities are seen.  The appendix is normal in caliber and contains air, without evidence of appendicitis. Mild diverticulosis is noted along the descending colon, without evidence of  diverticulitis.  The bladder is significantly distended and grossly remarkable. The prostate is enlarged, measuring 5.2 cm in transverse dimension, with minimal central calcification. No inguinal lymphadenopathy is seen.  No acute osseous abnormalities are identified.  IMPRESSION: 1. Mild right-sided hydronephrosis, with mild prominence of the right ureter. No distal obstructing stone is seen. This may reflect a recently passed stone, or the distended state of the bladder. 2. Small left renal cyst noted. 3. Mild diverticulosis along the descending colon, without evidence of diverticulitis. 4. Enlarged prostate noted.   Electronically Signed   By: Roanna Raider M.D.   On: 06/24/2014 22:07     EKG Interpretation None      MDM   Final diagnoses:  Diffuse abdominal pain    7:36 PM 49 y.o. male w hx of etoh abuse who presents with diffuse abdominal pain and mild distention which began yesterday. He denies any fevers or bloody stools. He has had some nausea and vomiting. He had a normal BM around 11 AM today. He states that he drank a 12 pack of beer today. Mildly tachycardic here, vital signs otherwise unremarkable. Will get lab work and imaging. Dilaudid for pain control.  11:51 PM: I interpreted/reviewed the labs and/or imaging which were non-contributory. Pt feeling much better. Tolerating po. Sx possibly related to etoh intake. Repeat abd exam shows a soft non-tender abdomen. Will prescribe a small amount of pain medicine and also Carafate. I have discussed the diagnosis/risks/treatment options with the patient and believe the pt to be eligible for discharge home to follow-up with his pcp. We also discussed returning to the ED immediately if new or worsening sx occur. We discussed the sx which are most concerning (e.g., worsening pain, fever) that necessitate immediate return. Medications administered to the patient during their visit and any new prescriptions provided to the patient are listed  below.  Medications given during this visit Medications  sodium chloride 0.9 % bolus 500 mL (0 mLs Intravenous Stopped 06/24/14 2234)  HYDROmorphone (DILAUDID) injection 1 mg (1 mg Intravenous Given 06/24/14 1950)  ondansetron (ZOFRAN) injection 4 mg (4 mg Intravenous Given 06/24/14 1950)  iohexol (OMNIPAQUE) 300 MG/ML solution 25 mL (25 mLs Oral Contrast Given 06/24/14 2005)  iohexol (OMNIPAQUE) 300 MG/ML solution 100 mL (100 mLs Intravenous Contrast Given 06/24/14 2133)  HYDROmorphone (DILAUDID) injection 1 mg (1 mg Intravenous Given 06/24/14 2159)  gi cocktail (Maalox,Lidocaine,Donnatal) (30 mLs Oral Given 06/24/14 2240)    New Prescriptions   OXYCODONE-ACETAMINOPHEN (PERCOCET) 5-325 MG PER TABLET    Take 2 tablets by mouth  every 4 (four) hours as needed.   SUCRALFATE (CARAFATE) 1 G TABLET    Take 1 tablet (1 g total) by mouth 4 (four) times daily -  with meals and at bedtime.      Purvis Sheffield, MD 06/24/14 2352  Purvis Sheffield, MD 06/24/14 2352

## 2014-06-24 NOTE — ED Notes (Signed)
CT notified that pt finished drinking contrast. 

## 2014-06-24 NOTE — ED Notes (Signed)
MD at bedside. 

## 2014-07-09 ENCOUNTER — Emergency Department (HOSPITAL_COMMUNITY)
Admission: EM | Admit: 2014-07-09 | Discharge: 2014-07-09 | Disposition: A | Discharge status: Home / Self Care | Payer: Self-pay | Attending: Emergency Medicine | Admitting: Emergency Medicine

## 2014-07-09 ENCOUNTER — Emergency Department (HOSPITAL_COMMUNITY): Payer: Self-pay

## 2014-07-09 ENCOUNTER — Encounter (HOSPITAL_COMMUNITY): Payer: Self-pay

## 2014-07-09 DIAGNOSIS — R1032 Left lower quadrant pain: Secondary | ICD-10-CM | POA: Insufficient documentation

## 2014-07-09 DIAGNOSIS — F10120 Alcohol abuse with intoxication, uncomplicated: Secondary | ICD-10-CM | POA: Insufficient documentation

## 2014-07-09 DIAGNOSIS — R079 Chest pain, unspecified: Secondary | ICD-10-CM | POA: Insufficient documentation

## 2014-07-09 DIAGNOSIS — Z79899 Other long term (current) drug therapy: Secondary | ICD-10-CM | POA: Insufficient documentation

## 2014-07-09 DIAGNOSIS — F10920 Alcohol use, unspecified with intoxication, uncomplicated: Secondary | ICD-10-CM

## 2014-07-09 DIAGNOSIS — Z8701 Personal history of pneumonia (recurrent): Secondary | ICD-10-CM | POA: Insufficient documentation

## 2014-07-09 DIAGNOSIS — R109 Unspecified abdominal pain: Secondary | ICD-10-CM

## 2014-07-09 DIAGNOSIS — Z720 Tobacco use: Secondary | ICD-10-CM | POA: Insufficient documentation

## 2014-07-09 DIAGNOSIS — R1013 Epigastric pain: Secondary | ICD-10-CM | POA: Insufficient documentation

## 2014-07-09 DIAGNOSIS — K219 Gastro-esophageal reflux disease without esophagitis: Secondary | ICD-10-CM | POA: Insufficient documentation

## 2014-07-09 DIAGNOSIS — Z8669 Personal history of other diseases of the nervous system and sense organs: Secondary | ICD-10-CM | POA: Insufficient documentation

## 2014-07-09 DIAGNOSIS — Z8679 Personal history of other diseases of the circulatory system: Secondary | ICD-10-CM | POA: Insufficient documentation

## 2014-07-09 DIAGNOSIS — J45909 Unspecified asthma, uncomplicated: Secondary | ICD-10-CM | POA: Insufficient documentation

## 2014-07-09 DIAGNOSIS — Z8659 Personal history of other mental and behavioral disorders: Secondary | ICD-10-CM | POA: Insufficient documentation

## 2014-07-09 LAB — RAPID URINE DRUG SCREEN, HOSP PERFORMED
AMPHETAMINES: NOT DETECTED
BENZODIAZEPINES: NOT DETECTED
Barbiturates: NOT DETECTED
Cocaine: NOT DETECTED
OPIATES: NOT DETECTED
Tetrahydrocannabinol: POSITIVE — AB

## 2014-07-09 LAB — COMPREHENSIVE METABOLIC PANEL
ALBUMIN: 4.2 g/dL (ref 3.5–5.0)
ALT: 30 U/L (ref 17–63)
ANION GAP: 13 (ref 5–15)
AST: 20 U/L (ref 15–41)
Alkaline Phosphatase: 120 U/L (ref 38–126)
BUN: 12 mg/dL (ref 6–20)
CO2: 20 mmol/L — ABNORMAL LOW (ref 22–32)
CREATININE: 0.97 mg/dL (ref 0.61–1.24)
Calcium: 8.6 mg/dL — ABNORMAL LOW (ref 8.9–10.3)
Chloride: 108 mmol/L (ref 101–111)
GFR calc Af Amer: >60 mL/min (ref >60)
Glucose, Bld: 119 mg/dL — ABNORMAL HIGH (ref 65–99)
Potassium: 3.8 mmol/L (ref 3.5–5.1)
Sodium: 141 mmol/L (ref 135–145)
Total Bilirubin: <0.1 mg/dL — ABNORMAL LOW (ref 0.3–1.2)
Total Protein: 7.9 g/dL (ref 6.5–8.1)

## 2014-07-09 LAB — URINALYSIS, ROUTINE W REFLEX MICROSCOPIC
Bilirubin Urine: NEGATIVE
GLUCOSE, UA: NEGATIVE mg/dL
Hgb urine dipstick: NEGATIVE
Ketones, ur: NEGATIVE mg/dL
Nitrite: NEGATIVE
Protein, ur: NEGATIVE mg/dL
SPECIFIC GRAVITY, URINE: 1.01 (ref 1.005–1.030)
Urobilinogen, UA: 0.2 mg/dL (ref 0.0–1.0)
pH: 5 (ref 5.0–8.0)

## 2014-07-09 LAB — LIPASE, BLOOD: LIPASE: 33 U/L (ref 22–51)

## 2014-07-09 LAB — CBC
HCT: 38 % — ABNORMAL LOW (ref 39.0–52.0)
Hemoglobin: 11.9 g/dL — ABNORMAL LOW (ref 13.0–17.0)
MCH: 21.9 pg — ABNORMAL LOW (ref 26.0–34.0)
MCHC: 31.3 g/dL (ref 30.0–36.0)
MCV: 69.9 fL — AB (ref 78.0–100.0)
PLATELETS: 258 10*3/uL (ref 150–400)
RBC: 5.44 MIL/uL (ref 4.22–5.81)
RDW: 17.9 % — ABNORMAL HIGH (ref 11.5–15.5)
WBC: 6.1 10*3/uL (ref 4.0–10.5)

## 2014-07-09 LAB — URINE MICROSCOPIC-ADD ON

## 2014-07-09 LAB — I-STAT TROPONIN, ED: TROPONIN I, POC: 0.01 ng/mL (ref 0.00–0.08)

## 2014-07-09 LAB — ETHANOL: Alcohol, Ethyl (B): 298 mg/dL — ABNORMAL HIGH (ref <5)

## 2014-07-09 MED ORDER — ONDANSETRON HCL 4 MG/2ML IJ SOLN
4.0000 mg | Freq: Once | INTRAMUSCULAR | Status: AC
  Administered 2014-07-09: 4 mg via INTRAVENOUS
  Filled 2014-07-09: qty 2

## 2014-07-09 MED ORDER — ALUM & MAG HYDROXIDE-SIMETH 200-200-20 MG/5ML PO SUSP
30.0000 mL | Freq: Once | ORAL | Status: AC
  Administered 2014-07-09: 30 mL via ORAL
  Filled 2014-07-09: qty 30

## 2014-07-09 MED ORDER — SODIUM CHLORIDE 0.9 % IV BOLUS (SEPSIS)
1000.0000 mL | Freq: Once | INTRAVENOUS | Status: AC
  Administered 2014-07-09: 1000 mL via INTRAVENOUS

## 2014-07-09 NOTE — ED Notes (Signed)
MD at the bedside  

## 2014-07-09 NOTE — ED Notes (Signed)
Pt back from bathroom with assist from tech, able to tell nurse and points to abd and chest pain.  Pt still moaning.  Pt reports he has had very little to drink today.

## 2014-07-09 NOTE — ED Notes (Addendum)
Pt brought to ED  by nephew.  Pt is homeless and called nephew he was having abdominal pain and chest pain.  Pt only able to tell nurse that he doesn't fel good.  Pt is moaning.  ETOH on board.

## 2014-07-09 NOTE — ED Provider Notes (Signed)
CSN: [id number]     Arrival date & time 07/09/14  1526 History   First MD Initiated Contact with Patient 07/09/14 1709     Chief Complaint  Patient presents with  . Abdominal Pain  . Chest Pain     (Consider location/radiation/quality/duration/timing/severity/associated sxs/prior Treatment) Patient is a 49 y.o. male presenting with abdominal pain.  Abdominal Pain Pain location:  LLQ Pain quality: burning   Pain radiates to:  Epigastric region and chest Pain severity:  Moderate Onset quality:  Gradual Duration:  8 hours Timing:  Intermittent Progression:  Waxing and waning Chronicity:  Recurrent Context: alcohol use   Relieved by:  None tried Worsened by:  Nothing tried Ineffective treatments:  None tried Associated symptoms: no anorexia, no chest pain, no chills, no constipation, no cough, no diarrhea, no dysuria, no fatigue, no fever, no melena, no nausea, no shortness of breath, no sore throat and no vomiting   Risk factors: alcohol abuse     Past Medical History  Diagnosis Date  . ETOH abuse   . Tobacco abuse   . Enlarged heart     "when I was little" (09/18/2012)  . Chest pain     "only related to asthma attack" (09/18/2012)  . Shortness of breath     " related to asthma attack; sometimes in the mornings too" (09/18/2012)  . Pneumonia   . Anxiety   . Sleep apnea     "I don't wear a mask" (09/18/2012)  . Asthma   . GERD (gastroesophageal reflux disease)   . Headache(784.0)     "only related to asthma attack" (09/18/2012)   Past Surgical History  Procedure Laterality Date  . Laparoscopic cholecystectomy  2007    Performed in [place]  . Nasal sinus surgery Right 10/01/2013    Procedure: ENDOSCOPIC Transantral Repair of Right Orbital Floor Fracture ;  Surgeon: Christia Reading, MD;  Location: [facility] OR;  Service: ENT;  Laterality: Right;  . Esophagogastroduodenoscopy N/A 01/01/2014    Procedure: ESOPHAGOGASTRODUODENOSCOPY (EGD);  Surgeon: Theda Belfast, MD;  Location: [facility]  ENDOSCOPY;  Service: Endoscopy;  Laterality: N/A;   Family History  Problem Relation Age of Onset  . Heart attack Paternal Grandfather 74  . Colon cancer Paternal Grandmother   . Stroke Sister    History  Substance Use Topics  . Smoking status: Current Some Day Smoker -- 0.25 packs/day for .3 years    Types: Cigarettes  . Smokeless tobacco: Never Used  . Alcohol Use: 0.0 oz/week    0 Standard drinks or equivalent per week     Comment: 3-4 cans of beer on the weekends    Review of Systems  Constitutional: Negative for fever, chills, appetite change and fatigue.  HENT: Negative for congestion, ear pain, facial swelling, mouth sores and sore throat.   Eyes: Negative for visual disturbance.  Respiratory: Negative for cough, chest tightness and shortness of breath.   Cardiovascular: Negative for chest pain and palpitations.  Gastrointestinal: Positive for abdominal pain. Negative for nausea, vomiting, diarrhea, constipation, blood in stool, melena and anorexia.  Endocrine: Negative for cold intolerance and heat intolerance.  Genitourinary: Negative for dysuria, frequency, decreased urine volume and difficulty urinating.  Musculoskeletal: Negative for back pain and neck stiffness.  Skin: Negative for rash.  Neurological: Negative for dizziness, weakness, light-headedness and headaches.  All other systems reviewed and are negative.     Allergies  Review of patient's allergies indicates no known allergies.  Home Medications   Prior to Admission medications  Medication Sig Start Date End Date Taking? Authorizing Provider  acetaminophen (TYLENOL) 325 MG tablet Take 1 tablet (325 mg total) by mouth every 6 (six) hours as needed for mild pain, fever or headache. Patient not taking: Reported on 04/26/2014 01/04/14   Leatha Gildingostin M Gherghe, MD  albuterol (PROVENTIL HFA;VENTOLIN HFA) 108 (90 BASE) MCG/ACT inhaler Inhale 2 puffs into the lungs every 6 (six) hours as needed for wheezing or  shortness of breath. 01/06/14   Doris Cheadleeepak Advani, MD  ondansetron (ZOFRAN ODT) 8 MG disintegrating tablet Take 1 tablet (8 mg total) by mouth every 8 (eight) hours as needed for nausea or vomiting. 04/27/14   Marisa Severinlga Otter, MD  oxyCODONE-acetaminophen (PERCOCET) 5-325 MG per tablet Take 2 tablets by mouth every 4 (four) hours as needed. 06/24/14   Purvis SheffieldForrest Harrison, MD  pantoprazole (PROTONIX) 40 MG tablet Take 1 tablet (40 mg total) by mouth daily at 6 (six) AM. Patient taking differently: Take 40 mg by mouth daily as needed (stomach pains).  01/04/14   Costin Otelia SergeantM Gherghe, MD  polyvinyl alcohol (ARTIFICIAL TEARS) 1.4 % ophthalmic solution Place 1 drop into both eyes as needed for dry eyes. 01/06/14   Doris Cheadleeepak Advani, MD  sucralfate (CARAFATE) 1 G tablet Take 1 tablet (1 g total) by mouth 4 (four) times daily -  with meals and at bedtime. 06/24/14   Purvis SheffieldForrest Harrison, MD  Vitamin D, Ergocalciferol, (DRISDOL) 50000 UNITS CAPS capsule Take 1 capsule (50,000 Units total) by mouth every 7 (seven) days. 01/07/14   Doris Cheadleeepak Advani, MD   BP 99/62 mmHg  Pulse 80  Temp(Src) 98 F (36.7 C)  Resp 15  SpO2 93% Physical Exam  Constitutional: He is oriented to person, place, and time. He appears well-nourished. No distress.  HENT:  Head: Normocephalic and atraumatic.  Right Ear: External ear normal.  Left Ear: External ear normal.  Eyes: Pupils are equal, round, and reactive to light. Right eye exhibits no discharge. Left eye exhibits no discharge. No scleral icterus.  Neck: Normal range of motion. Neck supple.  Cardiovascular: Normal rate.  Exam reveals no gallop and no friction rub.   No murmur heard. Pulmonary/Chest: Effort normal and breath sounds normal. No stridor. No respiratory distress. He has no wheezes. He has no rales. He exhibits no tenderness.  Abdominal: Soft. He exhibits no distension and no mass. There is no hepatosplenomegaly. There is tenderness in the epigastric area and left lower quadrant. There is no  rigidity, no rebound, no guarding and no CVA tenderness. No hernia.  Musculoskeletal: He exhibits no edema or tenderness.  Neurological: He is alert and oriented to person, place, and time.  Skin: Skin is warm and dry. No rash noted. He is not diaphoretic. No erythema.    ED Course  Procedures (including critical care time) Labs Review Labs Reviewed  CBC - Abnormal; Notable for the following:    Hemoglobin 11.9 (*)    HCT 38.0 (*)    MCV 69.9 (*)    MCH 21.9 (*)    RDW 17.9 (*)    All other components within normal limits  COMPREHENSIVE METABOLIC PANEL - Abnormal; Notable for the following:    CO2 20 (*)    Glucose, Bld 119 (*)    Calcium 8.6 (*)    Total Bilirubin <0.1 (*)    All other components within normal limits  URINALYSIS, ROUTINE W REFLEX MICROSCOPIC (NOT AT [facility]) - Abnormal; Notable for the following:    Leukocytes, UA TRACE (*)  All other components within normal limits  ETHANOL - Abnormal; Notable for the following:    Alcohol, Ethyl (B) 298 (*)    All other components within normal limits  URINE RAPID DRUG SCREEN (HOSP PERFORMED) NOT AT [facility] - Abnormal; Notable for the following:    Tetrahydrocannabinol POSITIVE (*)    All other components within normal limits  LIPASE, BLOOD  URINE MICROSCOPIC-ADD ON  Rosezena Sensor, ED    Imaging Review Dg Chest 2 View  07/09/2014   CLINICAL DATA:  Chest and abdominal pain.  EXAM: CHEST  2 VIEW  COMPARISON:  12/31/2013  FINDINGS: Low lung volumes are seen with mild bibasilar atelectasis. No pulmonary consolidation or edema. No evidence of pneumothorax or pleural effusion. Heart size and mediastinal contours are within normal limits.  IMPRESSION: Low lung volumes with mild bibasilar atelectasis.   Electronically Signed   By: Myles Rosenthal M.D.   On: 07/09/2014 17:28     EKG Interpretation   Date/Time:  Saturday July 09 2014 15:32:59 EDT Ventricular Rate:  119 PR Interval:  154 QRS Duration: 74 QT Interval:  306 QTC  Calculation: 430 R Axis:   68 Text Interpretation:  Sinus tachycardia Otherwise normal ECG No  significant change since last tracing Confirmed by Mirian Mo [phone])  on 07/09/2014 5:39:35 PM      MDM   Final diagnoses:  Alcohol intoxication, uncomplicated  Abdominal discomfort     49 year old male with a history of alcohol abuse esophagitis and gastritis who presents to ED with 8 hours of abdominal pain described as burning sensation that radiates from the left lower quadrant of the epigastrium into his chest. Patient denies any alcohol use today however she looks clinically intoxicated. Rest of the history and exam as above. EKG with sinus tachycardia normal intervals and axis. No evidence of acute ischemia arrhythmia or blocks. Doubt cardiac etiology.  Patient provided with IV fluids and Maalox. CBC without leukocytosis. CMP without significant electrolyte derangements. Lipase within normal limits. Urinalysis without evidence of infection. Alcohol level of 298. Chest x-ray without evidence of pneumonia, pneumomediastinum, or effusions. Patient monitored in the ED for several hours and serial abdominal exams revealed improving symptoms. Patient was ambulated and upon last reassessment the patient's pain had significantly improved. Abdomen was benign upon reexamination. Doubt diverticulitis, appendicitis. 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  Patient is safe for discharge and given strict return precautions. Patient to follow-up with his PCP as needed. Patient provided with resources for alcohol abuse treatment.  Patient seen in conjunction with Dr. Littie Deeds.  Deniece Portela, MD. Resident  Drema Pry, MD 07/09/14 4098  Mirian Mo, MD 07/11/14 [phone]

## 2014-07-09 NOTE — ED Notes (Signed)
Resident at the bedside

## 2014-07-09 NOTE — Discharge Instructions (Signed)
Alcohol Use Disorder °Alcohol use disorder is a mental disorder. It is not a one-time incident of heavy drinking. Alcohol use disorder is the excessive and uncontrollable use of alcohol over time that leads to problems with functioning in one or more areas of daily living. People with this disorder risk harming themselves and others when they drink to excess. Alcohol use disorder also can cause other mental disorders, such as mood and anxiety disorders, and serious physical problems. People with alcohol use disorder often misuse other drugs.  °Alcohol use disorder is common and widespread. Some people with this disorder drink alcohol to cope with or escape from negative life events. Others drink to relieve chronic pain or symptoms of mental illness. People with a family history of alcohol use disorder are at higher risk of losing control and using alcohol to excess.  °SYMPTOMS  °Signs and symptoms of alcohol use disorder may include the following:  °· Consumption of alcohol in larger amounts or over a longer period of time than intended. °· Multiple unsuccessful attempts to cut down or control alcohol use.   °· A great deal of time spent obtaining alcohol, using alcohol, or recovering from the effects of alcohol (hangover). °· A strong desire or urge to use alcohol (cravings).   °· Continued use of alcohol despite problems at work, school, or home because of alcohol use.   °· Continued use of alcohol despite problems in relationships because of alcohol use. °· Continued use of alcohol in situations when it is physically hazardous, such as driving a car. °· Continued use of alcohol despite awareness of a physical or psychological problem that is likely related to alcohol use. Physical problems related to alcohol use can involve the brain, heart, liver, stomach, and intestines. Psychological problems related to alcohol use include intoxication, depression, anxiety, psychosis, delirium, and dementia.   °· The need for  increased amounts of alcohol to achieve the same desired effect, or a decreased effect from the consumption of the same amount of alcohol (tolerance). °· Withdrawal symptoms upon reducing or stopping alcohol use, or alcohol use to reduce or avoid withdrawal symptoms. Withdrawal symptoms include: °¨ Racing heart. °¨ Hand tremor. °¨ Difficulty sleeping. °¨ Nausea. °¨ Vomiting. °¨ Hallucinations. °¨ Restlessness. °¨ Seizures. °DIAGNOSIS °Alcohol use disorder is diagnosed through an assessment by your health care provider. Your health care provider may start by asking three or four questions to screen for excessive or problematic alcohol use. To confirm a diagnosis of alcohol use disorder, at least two symptoms must be present within a 12-month period. The severity of alcohol use disorder depends on the number of symptoms: °· Mild--two or three. °· Moderate--four or five. °· Severe--six or more. °Your health care provider may perform a physical exam or use results from lab tests to see if you have physical problems resulting from alcohol use. Your health care provider may refer you to a mental health professional for evaluation. °TREATMENT  °Some people with alcohol use disorder are able to reduce their alcohol use to low-risk levels. Some people with alcohol use disorder need to quit drinking alcohol. When necessary, mental health professionals with specialized training in substance use treatment can help. Your health care provider can help you decide how severe your alcohol use disorder is and what type of treatment you need. The following forms of treatment are available:  °· Detoxification. Detoxification involves the use of prescription medicines to prevent alcohol withdrawal symptoms in the first week after quitting. This is important for people with a history of symptoms   of withdrawal and for heavy drinkers who are likely to have withdrawal symptoms. Alcohol withdrawal can be dangerous and, in severe cases, cause  death. Detoxification is usually provided in a hospital or in-patient substance use treatment facility. °· Counseling or talk therapy. Talk therapy is provided by substance use treatment counselors. It addresses the reasons people use alcohol and ways to keep them from drinking again. The goals of talk therapy are to help people with alcohol use disorder find healthy activities and ways to cope with life stress, to identify and avoid triggers for alcohol use, and to handle cravings, which can cause relapse. °· Medicines. Different medicines can help treat alcohol use disorder through the following actions: °¨ Decrease alcohol cravings. °¨ Decrease the positive reward response felt from alcohol use. °¨ Produce an uncomfortable physical reaction when alcohol is used (aversion therapy). °· Support groups. Support groups are run by people who have quit drinking. They provide emotional support, advice, and guidance. °These forms of treatment are often combined. Some people with alcohol use disorder benefit from intensive combination treatment provided by specialized substance use treatment centers. Both inpatient and outpatient treatment programs are available. °Document Released: 02/29/2004 Document Revised: 06/07/2013 Document Reviewed: 04/30/2012 °ExitCare® Patient Information ©2015 [employer]. This information is not intended to replace advice given to you by your health care provider. Make sure you discuss any questions you have with your health care provider. ° ° ° ° °Emergency Department Resource Guide °1) Find a Doctor and Pay Out of Pocket °Although you won't have to find out who is covered by your insurance plan, it is a good idea to ask around and get recommendations. You will then need to call the office and see if the doctor you have chosen will accept you as a new patient and what types of options they offer for patients who are self-pay. Some doctors offer discounts or will set up payment plans for  their patients who do not have insurance, but you will need to ask so you aren't surprised when you get to your appointment. ° °2) Contact Your Local Health Department °Not all health departments have doctors that can see patients for sick visits, but many do, so it is worth a call to see if yours does. If you don't know where your local health department is, you can check in your phone book. The CDC also has a tool to help you locate your state's health department, and many state websites also have listings of all of their local health departments. ° °3) Find a Walk-in Clinic °If your illness is not likely to be very severe or complicated, you may want to try a walk in clinic. These are popping up all over the country in pharmacies, drugstores, and shopping centers. They're usually staffed by nurse practitioners or physician assistants that have been trained to treat common illnesses and complaints. They're usually fairly quick and inexpensive. However, if you have serious medical issues or chronic medical problems, these are probably not your best option. ° °No Primary Care Doctor: °- Call [facility] at  832-8000 - they can help you locate a primary care doctor that  accepts your insurance, provides certain services, etc. °- Physician Referral Service- 1-800-533-3463 ° °Chronic Pain Problems: °Organization         Address  Phone   Notes  °[facility]  (336) 297-2271 Patients need to be referred by their primary care doctor.  ° °Medication Assistance: °Organization           Address  Phone   Notes  °[place] [facility] [address] °[place], [place] [address] (336) 641-8030 --Must be a resident of [place] °-- Must have NO insurance coverage whatsoever (no Medicaid/ Medicare, etc.) °-- The pt. MUST have a primary care doctor that directs their care regularly and follows them in the community °  °[facility]  (866) 331-1348   °[employer]  (888)  892-1162   ° °Agencies that provide inexpensive medical care: °Organization         Address  Phone   Notes  °[facility]  (336) 832-8035   °[facility]    (336) 832-7272   °[facility] [address] °[place], [place] [address] (336) 832-4777   °[facility] [address], °[place] (336) 271-4999   °[facility]    (336) 373-0678   °[facility]    (336) 272-1050   °[facility] ° [address], [place] Phone:  (336) 832-4444, Fax:  (336) 832-4440 Hours of Operation:  9 am - 6 pm, M-F.  Also accepts Medicaid/Medicare and self-pay.  °[facility] ° [address], [place] Phone: (336) 832-3150, Fax: (336) [phone]. Hours of Operation:  8:30 am - 5:30 pm, M-F.  Also accepts Medicaid and self-pay.  °[facility] [place] [address], [place] Phone: (336) 878-6027   °[facility] [address], [place], [place] (336)723-1848, Ext. 123 Mondays & Thursdays: 7-9 AM.  First 15 patients are seen on a first come, first serve basis. °  ° °Medicaid-accepting [place] Providers: ° °Organization         Address  Phone   Notes  °[facility] [address], [place] (336) 641-2100 Also accepts self-pay patients.  °[facility] [address], [place] ° (336) 856-9996   °[facility] [address], [place] (336) 288-8857   °[facility] [address] [place] [address], [place] (336) 299-7000   °Veita Bland [address], [place]  ° (336) 373-1557 Only accepts [place] Access Medicaid patients after they have their name applied to their card.  ° °Self-Pay (no insurance) in [place]: ° °Organization         Address  Phone   Notes  °Sickle Cell Patients, [facility] [address], [place] (336)  832-1970   °[facility] [address], [place] (336) 832-4400   °[facility] ° [address], [place] (336) 992-4800   °[facility]/Dr. Osei-Bonsu ° [address] [place] [address], [place] or [address], [place] (336) 841-8500 Phone number for both [place] and [place] locations is the same.  °[facility] [address], [place] (336) 299-0000   °[facility] [address] [place] [address], [place] or [address] (336) 852-7530 °(336) 878-2260   °[facility] [address],  (336) 350-1642, phone; (336) 294-5005, fax Sees patients 1st and 3rd Saturday of every month.  Must not qualify for public or private insurance (i.e. Medicaid, Medicare, [place] Health Choice, Veterans' Benefits) • Household income should be no more than 200% of the poverty level •The clinic cannot treat you if you are pregnant or think you   are pregnant • Sexually transmitted diseases are not treated at the clinic.  ° ° °Dental Care: °Organization         Address  Phone  Notes  °[facility] [address], [place] (336) 641-6152 Accepts children up to age 21 who are enrolled in Medicaid or [place] Health Choice; pregnant women with a Medicaid card; and children who have applied for Medicaid or [place] Health Choice, but were declined, whose parents can pay a reduced fee at time of service.  °[facility] [place]  [address], [place] (336) 641-7733 Accepts children up to age 21 who are enrolled in Medicaid or [place] Health Choice; pregnant women with a Medicaid card; and children who have applied for Medicaid or [place] Health Choice, but were declined, whose parents can pay a reduced fee at time of service.  °[facility] ° [address], [place] (336) 641-4533 Patients are  seen by appointment only. Walk-ins are not accepted. [facility] will see patients 18 years of age and older. °Monday - Tuesday (8am-5pm) °Most Wednesdays (8:30-5pm) °$30 per visit, cash only  °[facility] ° [address], [place] (336) 641-4533 Patients are seen by appointment only. Walk-ins are not accepted. [facility] will see patients 18 years of age and older. °One Wednesday Evening (Monthly: Volunteer Based).  $30 per visit, cash only  °[facility]  (919) 537-3737 for adults; Children under age 4, call Graduate Pediatric Dentistry at ([phone]) [phone]. Children aged 4-14, please call (919) [phone] to request a pediatric application. ° Dental services are provided in all areas of dental care including fillings, crowns and bridges, complete and partial dentures, implants, gum treatment, root canals, and extractions. Preventive care is also provided. Treatment is provided to both adults and children. °Patients are selected via a lottery and there is often a waiting list. °  °[facility] [address], °[place] ° (336) 763-8833 [email or web address] °  °[facility] [address], [place], [place] (336)723-1848, Ext. 123 Second and Fourth Thursday of each month, opens at 6:30 AM; Clinic ends at 9 AM.  Patients are seen on a first-come first-served basis, and a limited number are seen during each clinic.  ° °[facility] ° [address], [place], [place] (336) 723-7904   Eligibility Requirements °You must have lived in [place], [place], or [place] counties for at least the last three months. °  You cannot be eligible for state or federal sponsored healthcare insurance, including Veterans Administration, Medicaid, or Medicare. °  You generally cannot be eligible for healthcare insurance through your employer.  °  How to apply: °Eligibility screenings are held every Tuesday and Wednesday afternoon from 1:00 pm until 4:00  pm. You do not need an appointment for the interview!  °[facility] [address], [place], [place] 336-631-2330   °[facility]  336-342-8273   °[facility]  336-703-3100   °[facility]  336-570-6415   ° °Behavioral Health Resources in the Community: °Intensive Outpatient Programs °Organization         Address  Phone  Notes  °[facility] [address], [place], [place] 336-878-6098   °[facility] [address], [place], [place] 336-832-9800   °[facility] [address], [place], [place] ° 336-882-2125   °[facility] [address],  °[place], [place] 1-800-853-5163 or 336-641-4981   °Substance Abuse Resources °Organization         Address  Phone  Notes  °[facility]  336-882-2125   °[facility]  336-784-9470   °[facility]  336-285-9073   °[facility]  336-845-3988   °Residential & Outpatient Substance Abuse Program  1-800-659-3381   °Psychological Services °Organization         Address  Phone  Notes  °[facility]  336- 832-9600   °[facility]  336- 378-7881   °[facility] [address], [place] 1-800-853-5163 or 336-641-4981   ° °Mobile Crisis Teams °Organization         Address  Phone  Notes  °[facility], Mobile Crisis Care Unit  1-877-626-1772   °Assertive °[facility] ° [address] [place], [place] 336-834-9664   °Sharon DeEsch [address] °[place] 336-554-5454   ° °Self-Help/Support Groups °Organization         Address  Phone             Notes  °[facility] - variety of support groups  336- 373-1402 Call for more information  °Narcotics Anonymous (NA), [facility] [address], °[place]  2 meetings at this location  ° °Residential Treatment Programs °Organization          Address  Phone  Notes  °[facility] [address],    °[place]  1-866-801-8205   °[facility] ° [address], [place], [place] 704-293-8524   °[facility] [address], [place] 336-845-3988 Admissions: 8am-3pm M-F  °[facility] [address]    °[place], [place] 336-841-1104   °[facility] [address], [place], [place] 336-379-7146   °[facility] [address]  °[place], [place] 336-285-9073   °[facility] Intensive Outpatient [address], [place], [place] 336-852-3033   °[facility] [address]  °[place], [place] 1-877-615-2722 or 336-784-9470   °[facility] [address] [place], [place] 336-227-7417 Accepts Medicaid  °[facility] [address]  °[place] 1-800-659-3381 Substance Abuse/Addiction Treatment  ° °[place] Behavioral Health Resources °Organization         Address  Phone  Notes  °[facility]  (888) 581-9988   °Julie Brannon, PhD [address] [place], [place]   (336) 349-5553 or (336) 951-0000   °[facility]   [address] °[place], [place] (336) 349-4454   °[facility] [address], [place], [place] (336) 342-8316 Insurance/Medicaid/sponsorship through [facility]  °[facility] [address]                                    [place], [place] (336) 342-8316 Therapy/tele-psych/case  °[facility] [address]  ° [place], [place] (336) 349-2233    °Dr. Arfeen  (336) 349-4544   °[facility] [place]  [employer] [place] [facility] 1) [address], [place] °2) [address], [place] °3)  [address], [place] (336) 349-3220 °(336) 342-7768 ° °(336) 342-8140   °[place] [facility] (336)   342-1394 or (336) 342-3537 (After Hours)    ° ° ° °

## 2014-07-09 NOTE — ED Notes (Signed)
Patient ambulated without trouble and went to the bathroom. Patient is alert and oriented x4. Patient comfortable. States, "My pain is better." MD made aware.

## 2014-07-20 ENCOUNTER — Emergency Department (HOSPITAL_COMMUNITY)
Admission: EM | Admit: 2014-07-20 | Discharge: 2014-07-21 | Disposition: A | Discharge status: Home / Self Care | Payer: Self-pay | Attending: Emergency Medicine | Admitting: Emergency Medicine

## 2014-07-20 ENCOUNTER — Emergency Department (HOSPITAL_COMMUNITY): Payer: Self-pay

## 2014-07-20 ENCOUNTER — Encounter (HOSPITAL_COMMUNITY): Payer: Self-pay | Admitting: *Deleted

## 2014-07-20 DIAGNOSIS — Y9389 Activity, other specified: Secondary | ICD-10-CM | POA: Insufficient documentation

## 2014-07-20 DIAGNOSIS — J45909 Unspecified asthma, uncomplicated: Secondary | ICD-10-CM | POA: Insufficient documentation

## 2014-07-20 DIAGNOSIS — Z8659 Personal history of other mental and behavioral disorders: Secondary | ICD-10-CM | POA: Insufficient documentation

## 2014-07-20 DIAGNOSIS — Z8701 Personal history of pneumonia (recurrent): Secondary | ICD-10-CM | POA: Insufficient documentation

## 2014-07-20 DIAGNOSIS — Y998 Other external cause status: Secondary | ICD-10-CM | POA: Insufficient documentation

## 2014-07-20 DIAGNOSIS — Z720 Tobacco use: Secondary | ICD-10-CM | POA: Insufficient documentation

## 2014-07-20 DIAGNOSIS — K219 Gastro-esophageal reflux disease without esophagitis: Secondary | ICD-10-CM | POA: Insufficient documentation

## 2014-07-20 DIAGNOSIS — M542 Cervicalgia: Secondary | ICD-10-CM

## 2014-07-20 DIAGNOSIS — Y9289 Other specified places as the place of occurrence of the external cause: Secondary | ICD-10-CM | POA: Insufficient documentation

## 2014-07-20 DIAGNOSIS — S199XXA Unspecified injury of neck, initial encounter: Secondary | ICD-10-CM | POA: Insufficient documentation

## 2014-07-20 DIAGNOSIS — Z7951 Long term (current) use of inhaled steroids: Secondary | ICD-10-CM | POA: Insufficient documentation

## 2014-07-20 MED ORDER — NAPROXEN 500 MG PO TABS: 500.0000 mg | ORAL_TABLET | Freq: Two times a day (BID) | ORAL | Status: AC

## 2014-07-20 MED ORDER — HYDROCODONE-ACETAMINOPHEN 5-325 MG PO TABS
2.0000 | ORAL_TABLET | Freq: Once | ORAL | Status: AC
Start: 2014-07-20 — End: 2014-07-20
  Administered 2014-07-20: 2 via ORAL
  Filled 2014-07-20: qty 2
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
## 2014-07-20 NOTE — Discharge Instructions (Signed)
Assault, General °Assault includes any behavior, whether intentional or reckless, which results in bodily injury to another person and/or damage to property. Included in this would be any behavior, intentional or reckless, that by its nature would be understood (interpreted) by a reasonable person as intent to harm another person or to damage his/her property. Threats may be oral or written. They may be communicated through regular mail, computer, fax, or phone. These threats may be direct or implied. °FORMS OF ASSAULT INCLUDE: °· Physically assaulting a person. This includes physical threats to inflict physical harm as well as: °¨ Slapping. °¨ Hitting. °¨ Poking. °¨ Kicking. °¨ Punching. °¨ Pushing. °· Arson. °· Sabotage. °· Equipment vandalism. °· Damaging or destroying property. °· Throwing or hitting objects. °· Displaying a weapon or an object that appears to be a weapon in a threatening manner. °¨ Carrying a firearm of any kind. °¨ Using a weapon to harm someone. °· Using greater physical size/strength to intimidate another. °¨ Making intimidating or threatening gestures. °¨ Bullying. °¨ Hazing. °· Intimidating, threatening, hostile, or abusive language directed toward another person. °¨ It communicates the intention to engage in violence against that person. And it leads a reasonable person to expect that violent behavior may occur. °· Stalking another person. °IF IT HAPPENS AGAIN: °· Immediately call for emergency help (911 in [place] °· If someone poses clear and immediate danger to you, seek legal authorities to have a protective or restraining order put in place. °· Less threatening assaults can at least be reported to authorities. °STEPS TO TAKE IF A SEXUAL ASSAULT HAS HAPPENED °· Go to an area of safety. This may include a shelter or staying with a friend. Stay away from the area where you have been attacked. A large percentage of sexual assaults are caused by a friend, relative or associate. °· If  medications were given by your caregiver, take them as directed for the full length of time prescribed. °· Only take over-the-counter or prescription medicines for pain, discomfort, or fever as directed by your caregiver. °· If you have come in contact with a sexual disease, find out if you are to be tested again. If your caregiver is concerned about the HIV/AIDS virus, he/she may require you to have continued testing for several months. °· For the protection of your privacy, test results can not be given over the phone. Make sure you receive the results of your test. If your test results are not back during your visit, make an appointment with your caregiver to find out the results. Do not assume everything is normal if you have not heard from your caregiver or the medical facility. It is important for you to follow up on all of your test results. °· File appropriate papers with authorities. This is important in all assaults, even if it has occurred in a family or by a friend. °SEEK MEDICAL CARE IF: °· You have new problems because of your injuries. °· You have problems that may be because of the medicine you are taking, such as: °¨ Rash. °¨ Itching. °¨ Swelling. °¨ Trouble breathing. °· You develop belly (abdominal) pain, feel sick to your stomach (nausea) or are vomiting. °· You begin to run a temperature. °· You need supportive care or referral to a rape crisis center. These are centers with trained personnel who can help you get through this ordeal. °SEEK IMMEDIATE MEDICAL CARE IF: °· You are afraid of being threatened, beaten, or abused. In [place] call 911. °· You   receive new injuries related to abuse.  You develop severe pain in any area injured in the assault or have any change in your condition that concerns you.  You faint or lose consciousness.  You develop chest pain or shortness of breath. Document Released: 01/21/2005 Document Revised: 04/15/2011 Document Reviewed: 09/09/2007 [facility] Patient  Information 2015 [place], [place]. This information is not intended to replace advice given to you by your health care provider. Make sure you discuss any questions you have with your health care provider.   Cervical Sprain A cervical sprain is an injury in the neck in which the strong, fibrous tissues (ligaments) that connect your neck bones stretch or tear. Cervical sprains can range from mild to severe. Severe cervical sprains can cause the neck vertebrae to be unstable. This can lead to damage of the spinal cord and can result in serious nervous system problems. The amount of time it takes for a cervical sprain to get better depends on the cause and extent of the injury. Most cervical sprains heal in 1 to 3 weeks. CAUSES  Severe cervical sprains may be caused by:   Contact sport injuries (such as from football, rugby, wrestling, hockey, auto racing, gymnastics, diving, martial arts, or boxing).   Motor vehicle collisions.   Whiplash injuries. This is an injury from a sudden forward and backward whipping movement of the head and neck.  Falls.  Mild cervical sprains may be caused by:   Being in an awkward position, such as while cradling a telephone between your ear and shoulder.   Sitting in a chair that does not offer proper support.   Working at a poorly Marketing executive station.   Looking up or down for long periods of time.  SYMPTOMS   Pain, soreness, stiffness, or a burning sensation in the front, back, or sides of the neck. This discomfort may develop immediately after the injury or slowly, 24 hours or more after the injury.   Pain or tenderness directly in the middle of the back of the neck.   Shoulder or upper back pain.   Limited ability to move the neck.   Headache.   Dizziness.   Weakness, numbness, or tingling in the hands or arms.   Muscle spasms.   Difficulty swallowing or chewing.   Tenderness and swelling of the neck.  DIAGNOSIS  Most  of the time your health care provider can diagnose a cervical sprain by taking your history and doing a physical exam. Your health care provider will ask about previous neck injuries and any known neck problems, such as arthritis in the neck. X-rays may be taken to find out if there are any other problems, such as with the bones of the neck. Other tests, such as a CT scan or MRI, may also be needed.  TREATMENT  Treatment depends on the severity of the cervical sprain. Mild sprains can be treated with rest, keeping the neck in place (immobilization), and pain medicines. Severe cervical sprains are immediately immobilized. Further treatment is done to help with pain, muscle spasms, and other symptoms and may include:  Medicines, such as pain relievers, numbing medicines, or muscle relaxants.   Physical therapy. This may involve stretching exercises, strengthening exercises, and posture training. Exercises and improved posture can help stabilize the neck, strengthen muscles, and help stop symptoms from returning.  HOME CARE INSTRUCTIONS   Put ice on the injured area.   Put ice in a plastic bag.   Place a towel between your  and the bag.   °¨ Leave the ice on for 15-20 minutes, 3-4 times a day.   °· If your injury was severe, you may have been given a cervical collar to wear. A cervical collar is a two-piece collar designed to keep your neck from moving while it heals. °¨ Do not remove the collar unless instructed by your health care provider. °¨ If you have long hair, keep it outside of the collar. °¨ Ask your health care provider before making any adjustments to your collar. Minor adjustments may be required over time to improve comfort and reduce pressure on your chin or on the back of your head. °¨ If you are allowed to remove the collar for cleaning or bathing, follow your health care provider's instructions on how to do so safely. °¨ Keep your collar clean by wiping it with mild soap and water  and drying it completely. If the collar you have been given includes removable pads, remove them every 1-2 days and hand wash them with soap and water. Allow them to air dry. They should be completely dry before you wear them in the collar. °¨ If you are allowed to remove the collar for cleaning and bathing, wash and dry the skin of your neck. Check your skin for irritation or sores. If you see any, tell your health care provider. °¨ Do not drive while wearing the collar.   °· Only take over-the-counter or prescription medicines for pain, discomfort, or fever as directed by your health care provider.   °· Keep all follow-up appointments as directed by your health care provider.   °· Keep all physical therapy appointments as directed by your health care provider.   °· Make any needed adjustments to your workstation to promote good posture.   °· Avoid positions and activities that make your symptoms worse.   °· Warm up and stretch before being active to help prevent problems.   °SEEK MEDICAL CARE IF:  °· Your pain is not controlled with medicine.   °· You are unable to decrease your pain medicine over time as planned.   °· Your activity level is not improving as expected.   °SEEK IMMEDIATE MEDICAL CARE IF:  °· You develop any bleeding. °· You develop stomach upset. °· You have signs of an allergic reaction to your medicine.   °· Your symptoms get worse.   °· You develop new, unexplained symptoms.   °· You have numbness, tingling, weakness, or paralysis in any part of your body.   °MAKE SURE YOU:  °· Understand these instructions. °· Will watch your condition. °· Will get help right away if you are not doing well or get worse. °Document Released: 11/18/2006 Document Revised: 01/26/2013 Document Reviewed: 07/29/2012 °[employer]® Patient Information ©2015 [employer]. This information is not intended to replace advice given to you by your health care provider. Make sure you discuss any questions you have with your health  care provider. ° °

## 2014-08-05 ENCOUNTER — Encounter (HOSPITAL_COMMUNITY): Payer: Self-pay | Admitting: Emergency Medicine

## 2014-08-05 ENCOUNTER — Emergency Department (HOSPITAL_COMMUNITY)
Admission: EM | Admit: 2014-08-05 | Discharge: 2014-08-06 | Disposition: A | Discharge status: Home / Self Care | Payer: Self-pay | Attending: Emergency Medicine | Admitting: Emergency Medicine

## 2014-08-05 ENCOUNTER — Emergency Department (HOSPITAL_COMMUNITY): Payer: Self-pay

## 2014-08-05 DIAGNOSIS — Y9389 Activity, other specified: Secondary | ICD-10-CM | POA: Insufficient documentation

## 2014-08-05 DIAGNOSIS — Z720 Tobacco use: Secondary | ICD-10-CM | POA: Insufficient documentation

## 2014-08-05 DIAGNOSIS — K219 Gastro-esophageal reflux disease without esophagitis: Secondary | ICD-10-CM | POA: Insufficient documentation

## 2014-08-05 DIAGNOSIS — T1490XA Injury, unspecified, initial encounter: Secondary | ICD-10-CM

## 2014-08-05 DIAGNOSIS — J45909 Unspecified asthma, uncomplicated: Secondary | ICD-10-CM | POA: Insufficient documentation

## 2014-08-05 DIAGNOSIS — Y998 Other external cause status: Secondary | ICD-10-CM | POA: Insufficient documentation

## 2014-08-05 DIAGNOSIS — Y9289 Other specified places as the place of occurrence of the external cause: Secondary | ICD-10-CM | POA: Insufficient documentation

## 2014-08-05 DIAGNOSIS — F10920 Alcohol use, unspecified with intoxication, uncomplicated: Secondary | ICD-10-CM

## 2014-08-05 DIAGNOSIS — Z8669 Personal history of other diseases of the nervous system and sense organs: Secondary | ICD-10-CM | POA: Insufficient documentation

## 2014-08-05 DIAGNOSIS — F10120 Alcohol abuse with intoxication, uncomplicated: Secondary | ICD-10-CM | POA: Insufficient documentation

## 2014-08-05 DIAGNOSIS — S3991XA Unspecified injury of abdomen, initial encounter: Secondary | ICD-10-CM | POA: Insufficient documentation

## 2014-08-05 DIAGNOSIS — Z8701 Personal history of pneumonia (recurrent): Secondary | ICD-10-CM | POA: Insufficient documentation

## 2014-08-05 DIAGNOSIS — Z79899 Other long term (current) drug therapy: Secondary | ICD-10-CM | POA: Insufficient documentation

## 2014-08-05 DIAGNOSIS — Z791 Long term (current) use of non-steroidal anti-inflammatories (NSAID): Secondary | ICD-10-CM | POA: Insufficient documentation

## 2014-08-05 LAB — COMPREHENSIVE METABOLIC PANEL
ALBUMIN: 4.3 g/dL (ref 3.5–5.0)
ALT: 36 U/L (ref 17–63)
AST: 27 U/L (ref 15–41)
Alkaline Phosphatase: 111 U/L (ref 38–126)
Anion gap: 13 (ref 5–15)
BUN: 12 mg/dL (ref 6–20)
CHLORIDE: 110 mmol/L (ref 101–111)
CO2: 20 mmol/L — ABNORMAL LOW (ref 22–32)
CREATININE: 0.82 mg/dL (ref 0.61–1.24)
Calcium: 8.5 mg/dL — ABNORMAL LOW (ref 8.9–10.3)
GFR calc Af Amer: >60 mL/min (ref >60)
GFR calc non Af Amer: >60 mL/min (ref >60)
GLUCOSE: 105 mg/dL — AB (ref 65–99)
Potassium: 3.7 mmol/L (ref 3.5–5.1)
Sodium: 143 mmol/L (ref 135–145)
Total Bilirubin: 0.3 mg/dL (ref 0.3–1.2)
Total Protein: 7.8 g/dL (ref 6.5–8.1)

## 2014-08-05 LAB — CBC WITH DIFFERENTIAL/PLATELET
Basophils Absolute: 0 10*3/uL (ref 0.0–0.1)
Basophils Relative: 0 % (ref 0–1)
Eosinophils Absolute: 0.1 10*3/uL (ref 0.0–0.7)
Eosinophils Relative: 1 % (ref 0–5)
HCT: 40.6 % (ref 39.0–52.0)
Hemoglobin: 12.8 g/dL — ABNORMAL LOW (ref 13.0–17.0)
LYMPHS ABS: 2.5 10*3/uL (ref 0.7–4.0)
LYMPHS PCT: 40 % (ref 12–46)
MCH: 23 pg — AB (ref 26.0–34.0)
MCHC: 31.5 g/dL (ref 30.0–36.0)
MCV: 72.9 fL — ABNORMAL LOW (ref 78.0–100.0)
MONOS PCT: 5 % (ref 3–12)
Monocytes Absolute: 0.3 10*3/uL (ref 0.1–1.0)
Neutro Abs: 3.5 10*3/uL (ref 1.7–7.7)
Neutrophils Relative %: 54 % (ref 43–77)
Platelets: 231 10*3/uL (ref 150–400)
RBC: 5.57 MIL/uL (ref 4.22–5.81)
RDW: 18.5 % — ABNORMAL HIGH (ref 11.5–15.5)
WBC: 6.4 10*3/uL (ref 4.0–10.5)

## 2014-08-05 LAB — URINALYSIS, ROUTINE W REFLEX MICROSCOPIC
Bilirubin Urine: NEGATIVE
Glucose, UA: NEGATIVE mg/dL
Hgb urine dipstick: NEGATIVE
Ketones, ur: NEGATIVE mg/dL
LEUKOCYTES UA: NEGATIVE
NITRITE: NEGATIVE
PH: 5 (ref 5.0–8.0)
PROTEIN: NEGATIVE mg/dL
Specific Gravity, Urine: 1.003 — ABNORMAL LOW (ref 1.005–1.030)
UROBILINOGEN UA: 0.2 mg/dL (ref 0.0–1.0)

## 2014-08-05 MED ORDER — IOHEXOL 300 MG/ML  SOLN
100.0000 mL | Freq: Once | INTRAMUSCULAR | Status: AC | PRN
  Administered 2014-08-05: 100 mL via INTRAVENOUS

## 2014-08-05 MED ORDER — SODIUM CHLORIDE 0.9 % IV BOLUS (SEPSIS)
1000.0000 mL | Freq: Once | INTRAVENOUS | Status: AC
  Administered 2014-08-05: 1000 mL via INTRAVENOUS

## 2014-08-05 MED ORDER — ONDANSETRON HCL 4 MG/2ML IJ SOLN
4.0000 mg | Freq: Once | INTRAMUSCULAR | Status: AC
  Administered 2014-08-05: 4 mg via INTRAVENOUS
  Filled 2014-08-05: qty 2
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
## 2014-08-06 LAB — ETHANOL: ALCOHOL ETHYL (B): 326 mg/dL — AB (ref <5)

## 2014-08-06 MED ORDER — SODIUM CHLORIDE 0.9 % IV BOLUS (SEPSIS)
1000.0000 mL | Freq: Once | INTRAVENOUS | Status: AC
  Administered 2014-08-06: 1000 mL via INTRAVENOUS

## 2014-08-06 NOTE — Discharge Instructions (Signed)
Alcohol Intoxication °Alcohol intoxication occurs when you drink enough alcohol that it affects your ability to function. It can be mild or very severe. Drinking a lot of alcohol in a short time is called binge drinking. This can be very harmful. Drinking alcohol can also be more dangerous if you are taking medicines or other drugs. Some of the effects caused by alcohol may include: °· Loss of coordination. °· Changes in mood and behavior. °· Unclear thinking. °· Trouble talking (slurred speech). °· Throwing up (vomiting). °· Confusion. °· Slowed breathing. °· Twitching and shaking (seizures). °· Loss of consciousness. °HOME CARE °· Do not drive after drinking alcohol. °· Drink enough water and fluids to keep your pee (urine) clear or pale yellow. Avoid caffeine. °· Only take medicine as told by your doctor. °GET HELP IF: °· You throw up (vomit) many times. °· You do not feel better after a few days. °· You frequently have alcohol intoxication. Your doctor can help decide if you should see a substance use treatment counselor. °GET HELP RIGHT AWAY IF: °· You become shaky when you stop drinking. °· You have twitching and shaking. °· You throw up blood. It may look bright red or like coffee grounds. °· You notice blood in your poop (bowel movements). °· You become lightheaded or pass out (faint). °MAKE SURE YOU:  °· Understand these instructions. °· Will watch your condition. °· Will get help right away if you are not doing well or get worse. °Document Released: 07/10/2007 Document Revised: 09/23/2012 Document Reviewed: 06/26/2012 °[employer]® Patient Information ©2015 [employer]. This information is not intended to replace advice given to you by your health care provider. Make sure you discuss any questions you have with your health care provider. ° °

## 2014-08-06 NOTE — ED Provider Notes (Signed)
Patient is now much more alert. No slurred speech. Ambulating without difficulty.  Loren Raceravid Kaelea Gathright, MD 08/06/14 [phone]

## 2014-08-11 ENCOUNTER — Emergency Department (HOSPITAL_COMMUNITY)
Admission: EM | Admit: 2014-08-11 | Discharge: 2014-08-12 | Disposition: A | Discharge status: Home / Self Care | Payer: Self-pay | Attending: Emergency Medicine | Admitting: Emergency Medicine

## 2014-08-11 ENCOUNTER — Encounter (HOSPITAL_COMMUNITY): Payer: Self-pay | Admitting: Emergency Medicine

## 2014-08-11 ENCOUNTER — Emergency Department (HOSPITAL_COMMUNITY): Payer: Self-pay

## 2014-08-11 DIAGNOSIS — Z8659 Personal history of other mental and behavioral disorders: Secondary | ICD-10-CM | POA: Insufficient documentation

## 2014-08-11 DIAGNOSIS — Z8679 Personal history of other diseases of the circulatory system: Secondary | ICD-10-CM | POA: Insufficient documentation

## 2014-08-11 DIAGNOSIS — Z720 Tobacco use: Secondary | ICD-10-CM | POA: Insufficient documentation

## 2014-08-11 DIAGNOSIS — R1032 Left lower quadrant pain: Secondary | ICD-10-CM | POA: Insufficient documentation

## 2014-08-11 DIAGNOSIS — Z79899 Other long term (current) drug therapy: Secondary | ICD-10-CM | POA: Insufficient documentation

## 2014-08-11 DIAGNOSIS — Z8701 Personal history of pneumonia (recurrent): Secondary | ICD-10-CM | POA: Insufficient documentation

## 2014-08-11 DIAGNOSIS — R197 Diarrhea, unspecified: Secondary | ICD-10-CM | POA: Insufficient documentation

## 2014-08-11 DIAGNOSIS — R109 Unspecified abdominal pain: Secondary | ICD-10-CM

## 2014-08-11 DIAGNOSIS — J45909 Unspecified asthma, uncomplicated: Secondary | ICD-10-CM | POA: Insufficient documentation

## 2014-08-11 DIAGNOSIS — R1013 Epigastric pain: Secondary | ICD-10-CM | POA: Insufficient documentation

## 2014-08-11 DIAGNOSIS — K219 Gastro-esophageal reflux disease without esophagitis: Secondary | ICD-10-CM | POA: Insufficient documentation

## 2014-08-11 DIAGNOSIS — R112 Nausea with vomiting, unspecified: Secondary | ICD-10-CM | POA: Insufficient documentation

## 2014-08-11 DIAGNOSIS — Z8669 Personal history of other diseases of the nervous system and sense organs: Secondary | ICD-10-CM | POA: Insufficient documentation

## 2014-08-11 LAB — CBC WITH DIFFERENTIAL/PLATELET
BASOS ABS: 0 10*3/uL (ref 0.0–0.1)
BASOS PCT: 0 % (ref 0–1)
EOS ABS: 0 10*3/uL (ref 0.0–0.7)
Eosinophils Relative: 1 % (ref 0–5)
HCT: 42.6 % (ref 39.0–52.0)
Hemoglobin: 13.5 g/dL (ref 13.0–17.0)
Lymphocytes Relative: 36 % (ref 12–46)
Lymphs Abs: 2.3 10*3/uL (ref 0.7–4.0)
MCH: 23.2 pg — ABNORMAL LOW (ref 26.0–34.0)
MCHC: 31.7 g/dL (ref 30.0–36.0)
MCV: 73.2 fL — ABNORMAL LOW (ref 78.0–100.0)
Monocytes Absolute: 0.4 10*3/uL (ref 0.1–1.0)
Monocytes Relative: 6 % (ref 3–12)
NEUTROS ABS: 3.7 10*3/uL (ref 1.7–7.7)
NEUTROS PCT: 57 % (ref 43–77)
PLATELETS: 211 10*3/uL (ref 150–400)
RBC: 5.82 MIL/uL — AB (ref 4.22–5.81)
RDW: 18.3 % — AB (ref 11.5–15.5)
WBC: 6.5 10*3/uL (ref 4.0–10.5)

## 2014-08-11 LAB — COMPREHENSIVE METABOLIC PANEL
ALBUMIN: 4.1 g/dL (ref 3.5–5.0)
ALK PHOS: 106 U/L (ref 38–126)
ALT: 39 U/L (ref 17–63)
ANION GAP: 13 (ref 5–15)
AST: 28 U/L (ref 15–41)
BUN: 10 mg/dL (ref 6–20)
CO2: 18 mmol/L — ABNORMAL LOW (ref 22–32)
Calcium: 9 mg/dL (ref 8.9–10.3)
Chloride: 109 mmol/L (ref 101–111)
Creatinine, Ser: 0.75 mg/dL (ref 0.61–1.24)
GFR calc Af Amer: >60 mL/min (ref >60)
GFR calc non Af Amer: >60 mL/min (ref >60)
Glucose, Bld: 118 mg/dL — ABNORMAL HIGH (ref 65–99)
Potassium: 3.5 mmol/L (ref 3.5–5.1)
Sodium: 140 mmol/L (ref 135–145)
TOTAL PROTEIN: 7.7 g/dL (ref 6.5–8.1)
Total Bilirubin: 0.5 mg/dL (ref 0.3–1.2)

## 2014-08-11 LAB — LIPASE, BLOOD: LIPASE: 34 U/L (ref 22–51)

## 2014-08-11 LAB — URINALYSIS, ROUTINE W REFLEX MICROSCOPIC
Bilirubin Urine: NEGATIVE
Glucose, UA: NEGATIVE mg/dL
Hgb urine dipstick: NEGATIVE
Ketones, ur: NEGATIVE mg/dL
Leukocytes, UA: NEGATIVE
Nitrite: NEGATIVE
Protein, ur: NEGATIVE mg/dL
SPECIFIC GRAVITY, URINE: 1.006 (ref 1.005–1.030)
UROBILINOGEN UA: 0.2 mg/dL (ref 0.0–1.0)
pH: 5 (ref 5.0–8.0)

## 2014-08-11 MED ORDER — IOHEXOL 300 MG/ML  SOLN
100.0000 mL | Freq: Once | INTRAMUSCULAR | Status: AC | PRN
Start: 2014-08-11 — End: 2014-08-11
  Administered 2014-08-11: 100 mL via INTRAVENOUS

## 2014-08-11 MED ORDER — IOHEXOL 300 MG/ML  SOLN
25.0000 mL | Freq: Once | INTRAMUSCULAR | Status: AC | PRN
  Administered 2014-08-11: 25 mL via ORAL

## 2014-08-11 MED ORDER — OXYCODONE-ACETAMINOPHEN 5-325 MG PO TABS
1.0000 | ORAL_TABLET | Freq: Once | ORAL | Status: AC
  Administered 2014-08-11: 1 via ORAL

## 2014-08-11 MED ORDER — ONDANSETRON HCL 4 MG/2ML IJ SOLN
4.0000 mg | Freq: Once | INTRAMUSCULAR | Status: AC
  Administered 2014-08-11: 4 mg via INTRAVENOUS
  Filled 2014-08-11: qty 2

## 2014-08-11 MED ORDER — MORPHINE SULFATE 4 MG/ML IJ SOLN
4.0000 mg | Freq: Once | INTRAMUSCULAR | Status: AC
Start: 2014-08-11 — End: 2014-08-11
  Administered 2014-08-11: 4 mg via INTRAVENOUS
  Filled 2014-08-11: qty 1

## 2014-08-11 MED ORDER — MORPHINE SULFATE 4 MG/ML IJ SOLN
4.0000 mg | Freq: Once | INTRAMUSCULAR | Status: AC
  Administered 2014-08-11: 4 mg via INTRAVENOUS
  Filled 2014-08-11: qty 1

## 2014-08-11 MED ORDER — OXYCODONE-ACETAMINOPHEN 5-325 MG PO TABS
ORAL_TABLET | ORAL | Status: AC
  Filled 2014-08-11: qty 1

## 2014-08-11 NOTE — ED Notes (Signed)
Pt drinking contrast. 

## 2014-08-11 NOTE — ED Notes (Signed)
Pt states that he has been having "green urine" for the past 3 days

## 2014-08-11 NOTE — ED Notes (Signed)
Pt reports that he feels like he is going to pass out. Pt appears lethargic. Vs rechecked.
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
## 2014-08-11 NOTE — ED Notes (Addendum)
Pt here for evaluation of intermittent left lower quadrant abdominal pain starting four days ago with increase in pain today. Painful and firm to LLQ upon palpation. Pt states last BM was today and normal. Pt also states his urine was green today. Denies N/V/D. Pt also admits to drinking beer today, "some fourties."

## 2014-08-11 NOTE — ED Notes (Signed)
Called pt twice to update vital signs with no answer from waiting room

## 2014-08-12 MED ORDER — HYDROCODONE-ACETAMINOPHEN 5-325 MG PO TABS
1.0000 | ORAL_TABLET | Freq: Four times a day (QID) | ORAL | Status: AC | PRN
Start: 2014-08-12 — End: ?

## 2014-08-12 NOTE — Discharge Instructions (Signed)

## 2014-08-12 NOTE — ED Notes (Signed)
Patient ambulated to nurses station. Complained of pain and light headedness. Patient unsteady when walking.

## 2015-12-24 IMAGING — DX DG CHEST 2V
2 series · 2 of 2 positions shown · non-contrast
Comparison: 12/31/2013

CLINICAL DATA: Chest and abdominal pain.

EXAM:
CHEST  2 VIEW

[chest pa]
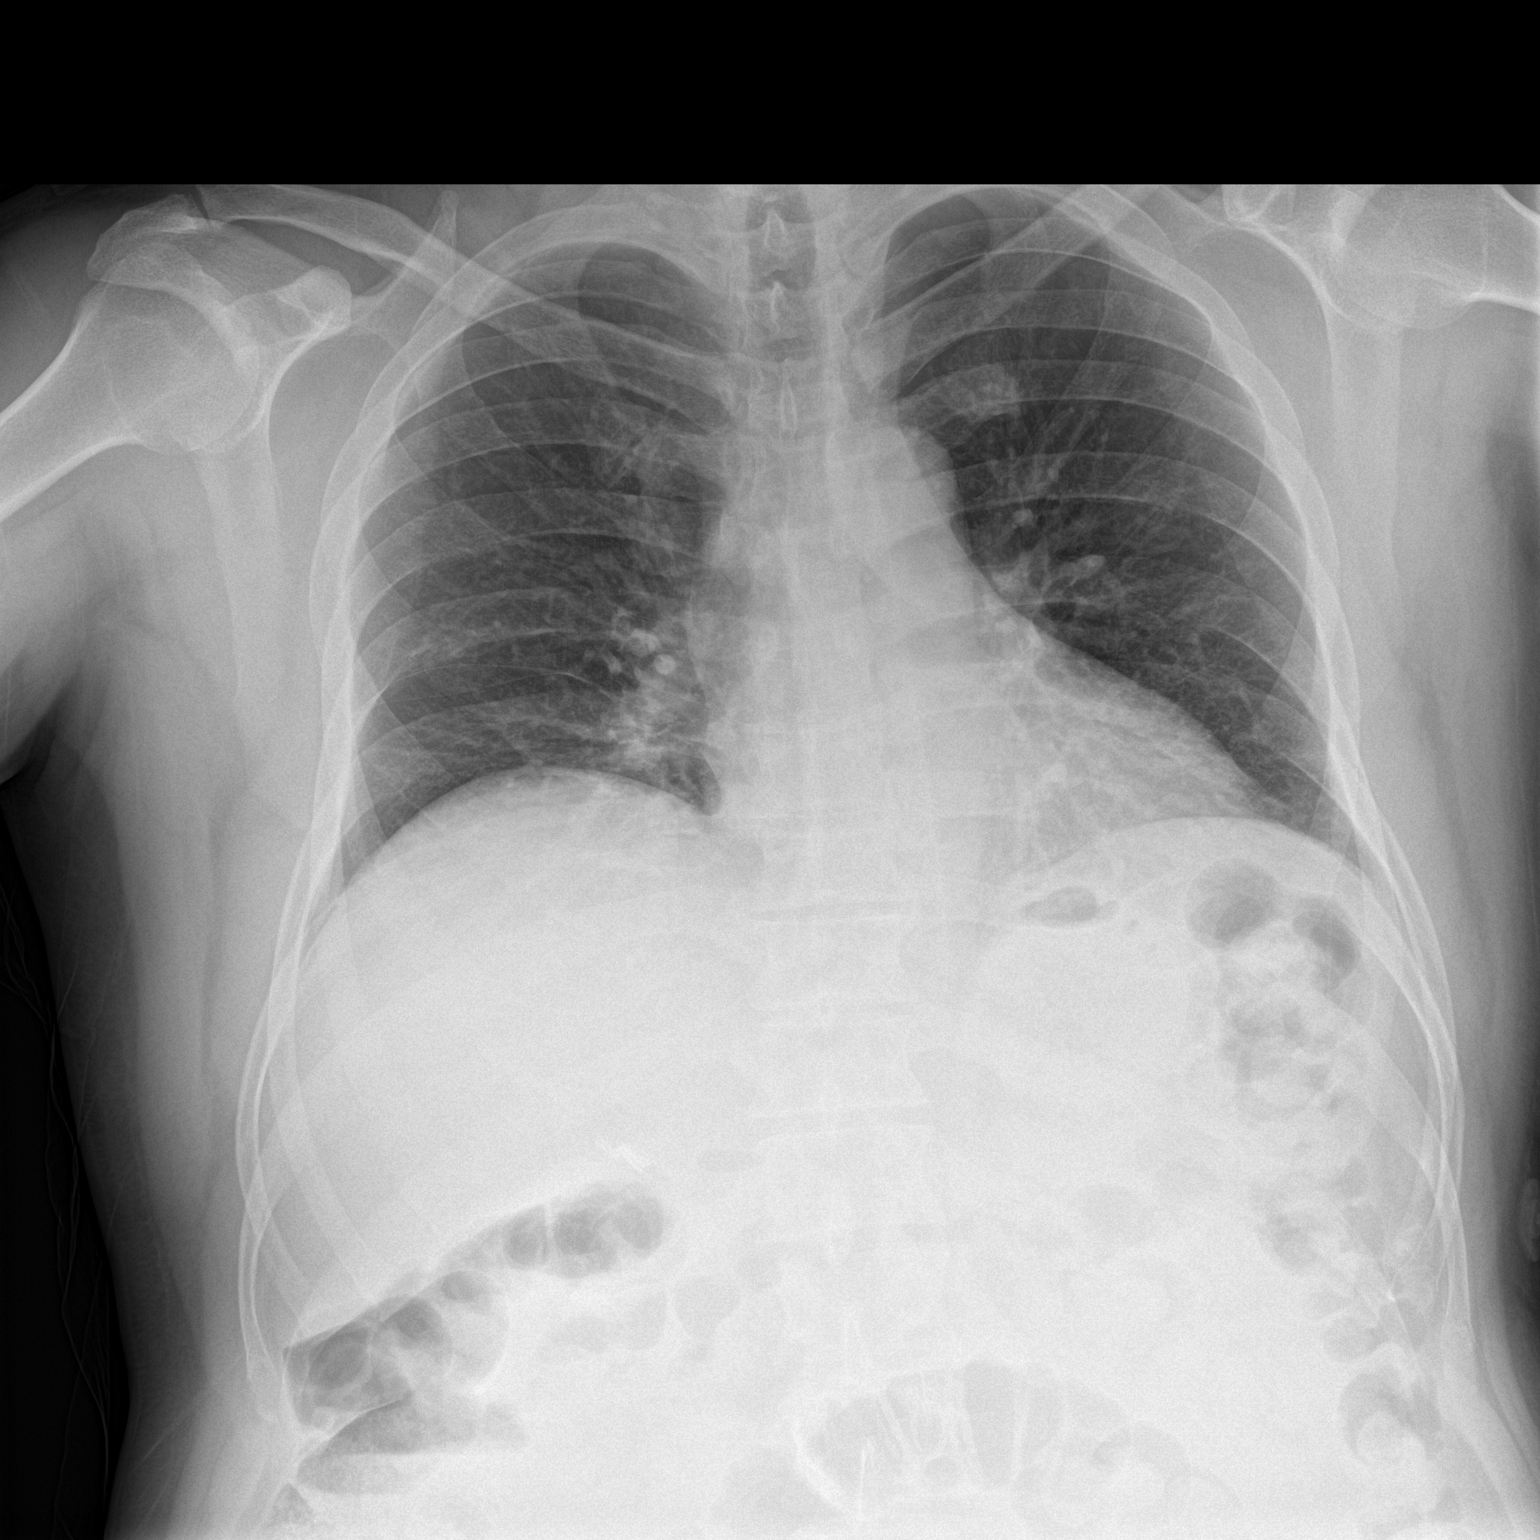

[chest lat]
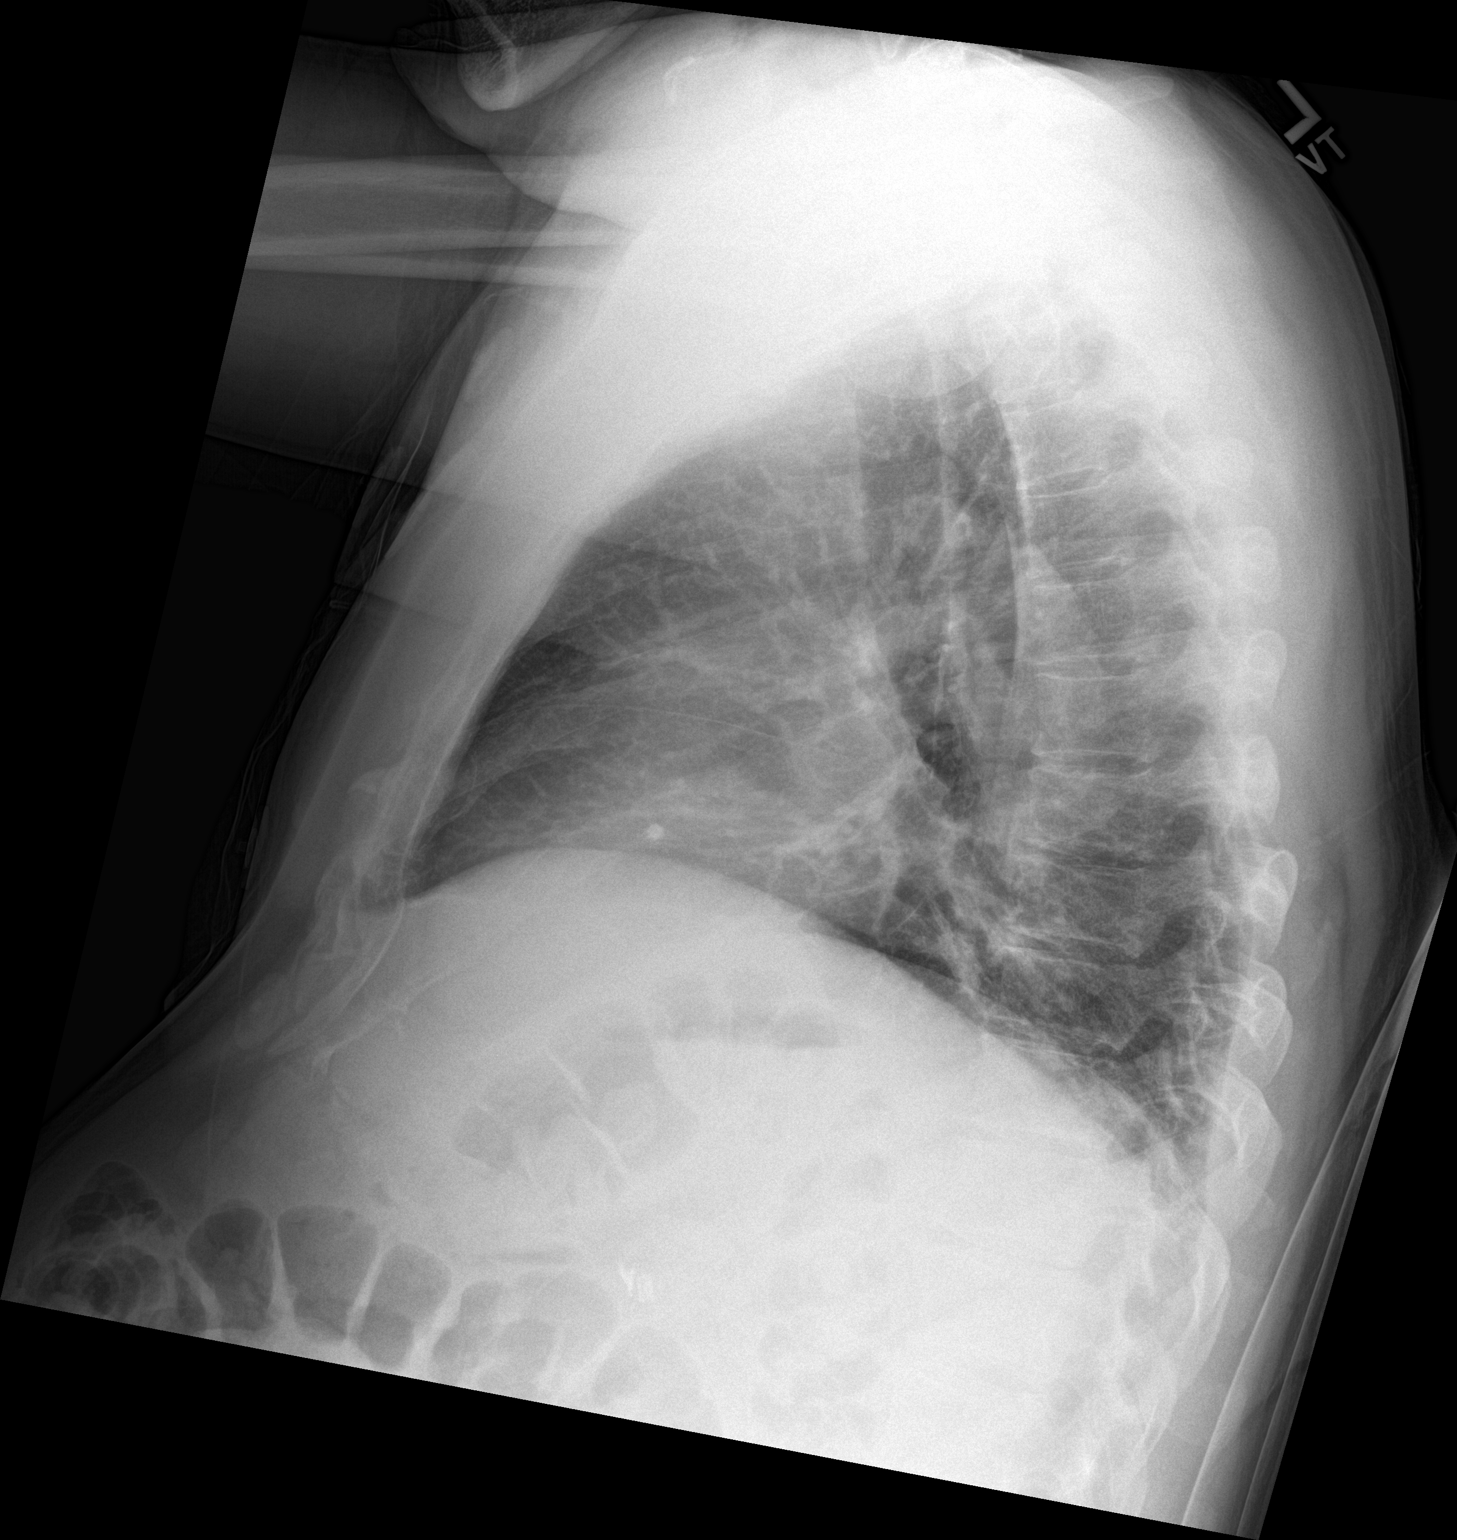

[2 of 2 positions shown; findings below may reference images not displayed]

FINDINGS: Low lung volumes are seen with mild bibasilar atelectasis. No
pulmonary consolidation or edema. No evidence of pneumothorax or
pleural effusion. Heart size and mediastinal contours are within
normal limits.
IMPRESSION: Low lung volumes with mild bibasilar atelectasis.
